# Patient Record
Sex: Female | Born: 1990 | Race: White | Hispanic: No | Marital: Married
Health system: Midwestern US, Community
[De-identification: ages and names within clinical notes are randomized; demographics above are authoritative.]

## PROBLEM LIST (undated history)

## (undated) ENCOUNTER — Inpatient Hospital Stay (HOSPITAL_COMMUNITY): Payer: Self-pay

## (undated) DIAGNOSIS — A530 Latent syphilis, unspecified as early or late: Secondary | ICD-10-CM

## (undated) DIAGNOSIS — Z789 Other specified health status: Secondary | ICD-10-CM

## (undated) DIAGNOSIS — K829 Disease of gallbladder, unspecified: Secondary | ICD-10-CM

## (undated) DIAGNOSIS — O24419 Gestational diabetes mellitus in pregnancy, unspecified control: Secondary | ICD-10-CM

## (undated) DIAGNOSIS — F99 Mental disorder, not otherwise specified: Secondary | ICD-10-CM

## (undated) DIAGNOSIS — F319 Bipolar disorder, unspecified: Secondary | ICD-10-CM

## (undated) DIAGNOSIS — Z0000 Encounter for general adult medical examination without abnormal findings: Secondary | ICD-10-CM

## (undated) HISTORY — PX: ADENOIDECTOMY: SUR15

## (undated) HISTORY — PX: OTHER SURGICAL HISTORY: SHX169

## (undated) HISTORY — DX: Latent syphilis, unspecified as early or late: A53.0

## (undated) HISTORY — PX: ERCP: SHX5425

## (undated) HISTORY — DX: Gestational diabetes mellitus in pregnancy, unspecified control: O24.419

---
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
## 2012-01-22 NOTE — L&D Delivery Note (Signed)
Delivery Note At 6:33 PM a viable female was delivered via Vaginal, Spontaneous Delivery (Presentation: Right Occiput Anterior).  APGAR: 9, 9; weight TBD.   Placenta status: Intact, Spontaneous.  Cord: 3 vessels with the following complications: None.   Mom presented in spontaneous labor with SROM. Her antenatal care was unremarkable. Labor was augmented with pitocin and her pain was controlled with an epidural.   Anesthesia: Epidural  Episiotomy: None Lacerations: None Suture Repair: none Est. Blood Loss (mL): 300  Mom to postpartum.  Baby to nursery-stable.  Emerson Schreifels L 09/28/2012, 7:12 PM

## 2012-02-08 DIAGNOSIS — M545 Low back pain, unspecified: Secondary | ICD-10-CM | POA: Insufficient documentation

## 2012-02-08 DIAGNOSIS — O9989 Other specified diseases and conditions complicating pregnancy, childbirth and the puerperium: Secondary | ICD-10-CM | POA: Insufficient documentation

## 2012-02-08 DIAGNOSIS — R109 Unspecified abdominal pain: Secondary | ICD-10-CM | POA: Insufficient documentation

## 2012-02-09 ENCOUNTER — Emergency Department (HOSPITAL_COMMUNITY)
Admission: EM | Admit: 2012-02-09 | Discharge: 2012-02-09 | Discharge status: Against Medical Advice | Payer: Self-pay | Attending: Emergency Medicine | Admitting: Emergency Medicine

## 2012-02-09 ENCOUNTER — Encounter (HOSPITAL_COMMUNITY): Payer: Self-pay | Admitting: Emergency Medicine

## 2012-02-09 DIAGNOSIS — Z3490 Encounter for supervision of normal pregnancy, unspecified, unspecified trimester: Secondary | ICD-10-CM

## 2012-02-09 LAB — URINALYSIS, ROUTINE W REFLEX MICROSCOPIC
Glucose, UA: NEGATIVE mg/dL
Specific Gravity, Urine: 1.019 (ref 1.005–1.030)
pH: 5.5 (ref 5.0–8.0)

## 2012-02-09 LAB — PREGNANCY, URINE: Preg Test, Ur: POSITIVE — AB

## 2012-02-09 LAB — URINE MICROSCOPIC-ADD ON

## 2012-02-09 MED ORDER — PRENATAL COMPLETE 14-0.4 MG PO TABS: 1.0000 | ORAL_TABLET | Freq: Once | ORAL | Status: DC

## 2012-02-09 NOTE — ED Provider Notes (Signed)
History     CSN: [id number]  Arrival date & time 02/08/12  2354   First MD Initiated Contact with Patient 02/09/12 0017      Chief Complaint  Patient presents with  . Abdominal Cramping   HPI  History provided by the patient and mother. Patient is a 22 year old female with no significant PMH who presents with complaints of occasional low back pain and cramping as well as concerns for possible new pregnancy. Patient is unsure but believes her last menstrual cycle was in November. Patient was having some back and abdominal cramps after lifting furniture at home a few days ago. Patient's mother was concerned for her symptoms and have patient use a home pregnancy test which was positive. Patient is unsure exactly how far along she may be. She denies having any constant pains. Denies any vaginal bleeding or vaginal discharge. She denies any problems with urination. No dysuria, urinary frequency or hematuria. Patient has not used any treatment for her occasional back soreness and cramps.    Past Medical History  Diagnosis Date  . No pertinent past medical history     Past Surgical History  Procedure Date  . Tubes in ears   . Cholecystectomy 2012    Family History  Problem Relation Age of Onset  . Diabetes Father   . Hypertension Father   . Diabetes Maternal Aunt   . Hypertension Maternal Aunt   . Hypertension Maternal Uncle   . Hypertension Maternal Grandmother   . Asthma Paternal Grandmother     History  Substance Use Topics  . Smoking status: Never Smoker   . Smokeless tobacco: Never Used  . Alcohol Use: No    OB History    Grav Para Term Preterm Abortions TAB SAB Ect Mult Living   2 1 1       1       Review of Systems  Gastrointestinal: Negative for nausea, vomiting and abdominal pain.  Genitourinary: Negative for vaginal bleeding and vaginal discharge.  Musculoskeletal: Positive for back pain.  All other systems reviewed and are negative.    Allergies    Review of patient's allergies indicates no known allergies.  Home Medications  No current outpatient prescriptions on file.  BP 135/67  Pulse 79  Temp 98.3 F (36.8 C) (Oral)  Resp 16  Ht 5\' 4"  (1.626 m)  Wt 216 lb 3.2 oz (98.068 kg)  BMI 37.11 kg/m2  SpO2 100%  LMP 12/05/2011  Physical Exam  Nursing note and vitals reviewed. Constitutional: She is oriented to person, place, and time. She appears well-developed and well-nourished. No distress.  HENT:  Head: Normocephalic.  Cardiovascular: Normal rate and regular rhythm.   Pulmonary/Chest: Effort normal and breath sounds normal. No respiratory distress. She has no wheezes. She has no rales.  Abdominal: Soft. There is no tenderness. There is no rebound and no guarding.       Obese  Genitourinary: Cervix exhibits no motion tenderness and no friability. Right adnexum displays no mass and no tenderness. Left adnexum displays no mass and no tenderness.       Chaperone was present. Cervix is closed. There is thick white discharge throughout. No bleeding or blood in the vaginal canal.  Musculoskeletal:        Mild lumbar paraspinous tenderness.  Neurological: She is alert and oriented to person, place, and time.  Skin: Skin is warm and dry. No rash noted.  Psychiatric: She has a normal mood and affect. Her behavior is normal.  ED Course  Procedures  Results for orders placed during the hospital encounter of 02/09/12  URINALYSIS, ROUTINE W REFLEX MICROSCOPIC      Component Value Range   Color, Urine YELLOW  YELLOW   APPearance HAZY (*) CLEAR   Specific Gravity, Urine 1.019  1.005 - 1.030   pH 5.5  5.0 - 8.0   Glucose, UA NEGATIVE  NEGATIVE mg/dL   Hgb urine dipstick NEGATIVE  NEGATIVE   Bilirubin Urine NEGATIVE  NEGATIVE   Ketones, ur NEGATIVE  NEGATIVE mg/dL   Protein, ur NEGATIVE  NEGATIVE mg/dL   Urobilinogen, UA 0.2  0.0 - 1.0 mg/dL   Nitrite NEGATIVE  NEGATIVE   Leukocytes, UA SMALL (*) NEGATIVE  PREGNANCY, URINE       Component Value Range   Preg Test, Ur POSITIVE (*) NEGATIVE  URINE MICROSCOPIC-ADD ON      Component Value Range   Squamous Epithelial / LPF MANY (*) RARE   WBC, UA 0-2  <3 WBC/hpf   Bacteria, UA FEW (*) RARE  HCG, QUANTITATIVE, PREGNANCY      Component Value Range   hCG, Beta Chain, Quant, S 30631 (*) <5 mIU/mL       1. Pregnancy       MDM  Patient seen and evaluated. Patient appears well in no acute distress. She is soft nontender abdomen on exam. There is a mild paralumbar tenderness.   I discussed with patient options for further workup of her pregnancy. At this time I doubt any emergent condition and we will proceed with ultrasound testing and labwork.   Patient has continued to wait for ultrasound test but no longer wishes to wait. She is not in any pain or discomfort while she's been in the emergency room. Patient advised of the risks, benefits of staying for her ultrasound versus leaving at this point. She understands that by leaving there may be signs of miscarriage or other concerning findings such as possible ectopic pregnancy. She understands this and understands she may return at anytime for further evaluation. Patient strongly encouraged to return if she has persistent or worsening pain symptoms, vaginal bleeding or increased discharge.     Angus Seller, [place] 02/09/12 [phone]

## 2012-02-09 NOTE — ED Notes (Signed)
Pt mother sts she has been lifting heavy objects and having cramping. Pt is unaware how along she is in pregnancy.

## 2012-02-09 NOTE — ED Provider Notes (Signed)
Medical screening examination/treatment/procedure(s) were performed by non-physician practitioner and as supervising physician I was immediately available for consultation/collaboration.  Avarie Tavano M Tylique Aull, MD 02/09/12 0610 

## 2012-02-09 NOTE — ED Notes (Signed)
Pelvic setup in room 

## 2012-02-29 ENCOUNTER — Inpatient Hospital Stay (HOSPITAL_COMMUNITY)
Admission: AD | Admit: 2012-02-29 | Discharge: 2012-03-01 | Disposition: A | Discharge status: Home / Self Care | Payer: Self-pay | Source: Ambulatory Visit | Attending: Obstetrics & Gynecology | Admitting: Obstetrics & Gynecology

## 2012-02-29 ENCOUNTER — Inpatient Hospital Stay (HOSPITAL_COMMUNITY): Payer: Self-pay

## 2012-02-29 ENCOUNTER — Encounter (HOSPITAL_COMMUNITY): Payer: Self-pay | Admitting: *Deleted

## 2012-02-29 DIAGNOSIS — R109 Unspecified abdominal pain: Secondary | ICD-10-CM | POA: Insufficient documentation

## 2012-02-29 DIAGNOSIS — B9689 Other specified bacterial agents as the cause of diseases classified elsewhere: Secondary | ICD-10-CM | POA: Insufficient documentation

## 2012-02-29 DIAGNOSIS — A499 Bacterial infection, unspecified: Secondary | ICD-10-CM | POA: Insufficient documentation

## 2012-02-29 DIAGNOSIS — O26891 Other specified pregnancy related conditions, first trimester: Secondary | ICD-10-CM

## 2012-02-29 DIAGNOSIS — N760 Acute vaginitis: Secondary | ICD-10-CM | POA: Insufficient documentation

## 2012-02-29 DIAGNOSIS — N949 Unspecified condition associated with female genital organs and menstrual cycle: Secondary | ICD-10-CM | POA: Insufficient documentation

## 2012-02-29 HISTORY — DX: Bipolar disorder, unspecified: F31.9

## 2012-02-29 HISTORY — DX: Mental disorder, not otherwise specified: F99

## 2012-02-29 LAB — URINALYSIS, ROUTINE W REFLEX MICROSCOPIC
Bilirubin Urine: NEGATIVE
Hgb urine dipstick: NEGATIVE
Protein, ur: NEGATIVE mg/dL
Urobilinogen, UA: 0.2 mg/dL (ref 0.0–1.0)

## 2012-02-29 LAB — WET PREP, GENITAL: Trich, Wet Prep: NONE SEEN

## 2012-02-29 NOTE — MAU Note (Signed)
Having cramping in my lower stomach since 2000

## 2012-02-29 NOTE — MAU Provider Note (Signed)
History     CSN: [id number]  Arrival date and time: 02/29/12 2134   First Provider Initiated Contact with Patient 02/29/12 2224      Chief Complaint  Patient presents with  . Abdominal Pain   HPI  Pt is a G2P1001 here at unknown gestational age with report of abdominal pain that started at around 2000 tonight.  Pain is described as sharp and in lower pelvis.  No report of vaginal bleeding or abnormal vaginal discharge.  Pt reports uncertain about LMP, cycles are irregular.  Reports multiple sexual partners.  Past Medical History  Diagnosis Date  . No pertinent past medical history   . Mental disorder   . Bipolar 1 disorder     Past Surgical History  Procedure Laterality Date  . Tubes in ears    . Cholecystectomy  2012    Family History  Problem Relation Age of Onset  . Diabetes Father   . Hypertension Father   . Diabetes Maternal Aunt   . Hypertension Maternal Aunt   . Hypertension Maternal Uncle   . Hypertension Maternal Grandmother   . Asthma Paternal Grandmother     History  Substance Use Topics  . Smoking status: Never Smoker   . Smokeless tobacco: Never Used  . Alcohol Use: No    Allergies: No Known Allergies  Prescriptions prior to admission  Medication Sig Dispense Refill  . Prenatal Vit-Fe Fumarate-FA (PRENATAL COMPLETE) 14-0.4 MG TABS Take 1 tablet by mouth once.  60 each  0    Review of Systems  Gastrointestinal: Positive for abdominal pain (lower pelvis). Negative for nausea and vomiting.  All other systems reviewed and are negative.   Physical Exam   Blood pressure 123/58, pulse 89, temperature 98.2 F (36.8 C), temperature source Oral, resp. rate 20, height 5\' 4"  (1.626 m), last menstrual period 12/05/2011, unknown if currently breastfeeding.  Physical Exam  Constitutional: She is oriented to person, place, and time. She appears well-developed and well-nourished. No distress.  HENT:  Head: Normocephalic.  Neck: Normal range of motion.  Neck supple.  Cardiovascular: Normal rate, regular rhythm and normal heart sounds.   Respiratory: Effort normal and breath sounds normal. No respiratory distress.  GI: Soft. She exhibits no mass. There is no tenderness. There is no rebound and no guarding.  Genitourinary: There is no lesion on the right labia. There is no lesion on the left labia. Uterus is enlarged. Right adnexum displays no mass, no tenderness and no fullness. Left adnexum displays no mass, no tenderness and no fullness. No bleeding around the vagina.  Musculoskeletal: Normal range of motion.  Neurological: She is alert and oriented to person, place, and time.  Skin: Skin is warm and dry.    MAU Course  Procedures  Results for orders placed during the hospital encounter of 02/29/12 (from the past 24 hour(s))  URINALYSIS, ROUTINE W REFLEX MICROSCOPIC     Status: Abnormal   Collection Time    02/29/12 10:00 PM      Result Value Range   Color, Urine YELLOW  YELLOW   APPearance CLEAR  CLEAR   Specific Gravity, Urine >1.030 (*) 1.005 - 1.030   pH 5.5  5.0 - 8.0   Glucose, UA NEGATIVE  NEGATIVE mg/dL   Hgb urine dipstick NEGATIVE  NEGATIVE   Bilirubin Urine NEGATIVE  NEGATIVE   Ketones, ur 15 (*) NEGATIVE mg/dL   Protein, ur NEGATIVE  NEGATIVE mg/dL   Urobilinogen, UA 0.2  0.0 - 1.0 mg/dL  Nitrite NEGATIVE  NEGATIVE   Leukocytes, UA NEGATIVE  NEGATIVE  WET PREP, GENITAL     Status: Abnormal   Collection Time    02/29/12 10:32 PM      Result Value Range   Yeast Wet Prep HPF POC NONE SEEN  NONE SEEN   Trich, Wet Prep NONE SEEN  NONE SEEN   Clue Cells Wet Prep HPF POC FEW (*) NONE SEEN   WBC, Wet Prep HPF POC FEW (*) NONE SEEN   IMPRESSION:  Single live intrauterine gestation with estimated gestational age  [redacted] weeks 0 days by crown-rump length.    Assessment and Plan  Pelvic Pain in Early Pregnancy Bacterial Vaginosis  Plan: DC to home RX Flagyl Begin prenatal care as soon as  possible  [facility] 02/29/2012, 10:25 PM

## 2012-03-01 ENCOUNTER — Encounter (HOSPITAL_COMMUNITY): Payer: Self-pay | Admitting: Family

## 2012-03-01 DIAGNOSIS — R109 Unspecified abdominal pain: Secondary | ICD-10-CM

## 2012-03-01 DIAGNOSIS — B9689 Other specified bacterial agents as the cause of diseases classified elsewhere: Secondary | ICD-10-CM

## 2012-03-01 DIAGNOSIS — N949 Unspecified condition associated with female genital organs and menstrual cycle: Secondary | ICD-10-CM

## 2012-03-01 DIAGNOSIS — A499 Bacterial infection, unspecified: Secondary | ICD-10-CM

## 2012-03-01 DIAGNOSIS — N760 Acute vaginitis: Secondary | ICD-10-CM

## 2012-03-01 MED ORDER — METRONIDAZOLE 500 MG PO TABS: 500.0000 mg | ORAL_TABLET | Freq: Two times a day (BID) | ORAL | Status: DC

## 2012-03-01 NOTE — Progress Notes (Signed)
Written and verbal d/c instructions given and understanding voiced. 

## 2012-03-01 NOTE — MAU Provider Note (Signed)
Attestation of Attending Supervision of Advanced Practitioner (CNM/NP): Evaluation and management procedures were performed by the Advanced Practitioner under my supervision and collaboration.  I have reviewed the Advanced Practitioner's note and chart, and I agree with the management and plan.  HARRAWAY-Waage, Alphons Burgert 9:15 AM

## 2012-03-03 LAB — GC/CHLAMYDIA PROBE AMP
CT Probe RNA: NEGATIVE
GC Probe RNA: NEGATIVE

## 2012-05-06 IMAGING — US US OB COMP LESS 14 WK
1 series · 14 of 28 positions shown · non-contrast
Comparison: none

OBSTETRICAL ULTRASOUND:
 This ultrasound exam was performed in the [HOSPITAL] Ultrasound Department.  The OB US report was generated in the AS system, and faxed to the ordering physician.  This report is also available in [HOSPITAL]?s [employer] and in [REDACTED] PACS.

[Series 1: us ob comp less 14 wks · 0.25mm/px · 14 of 30 slices shown]
[im 2/30]
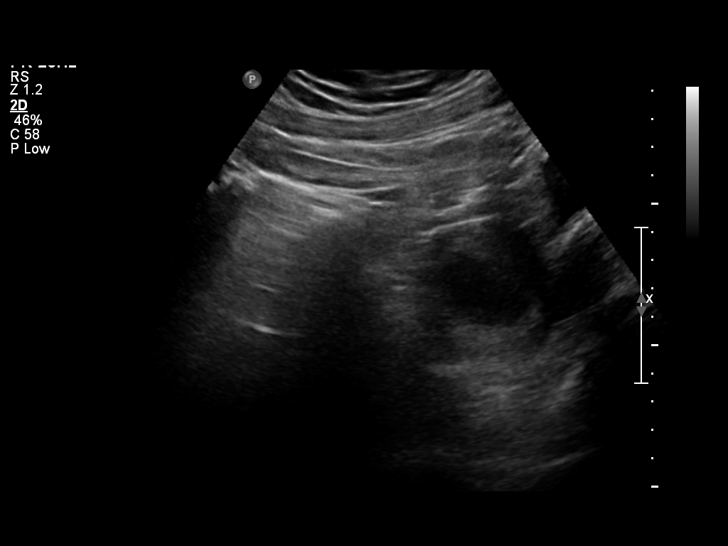
[im 4/30]
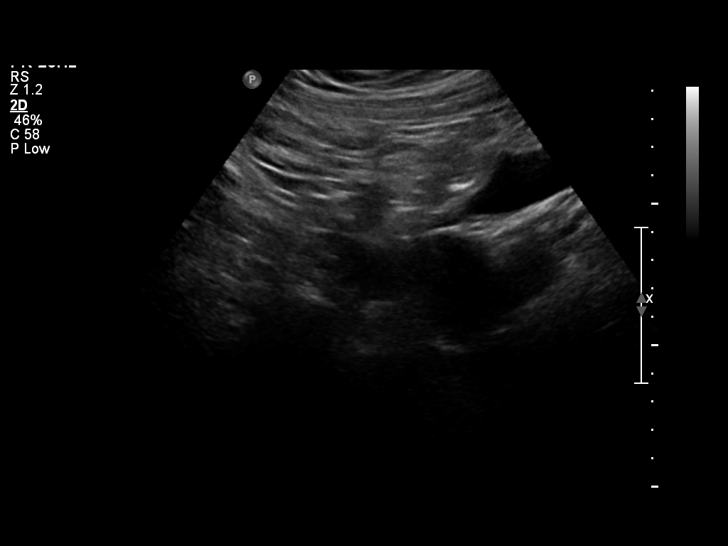
[im 6/30]
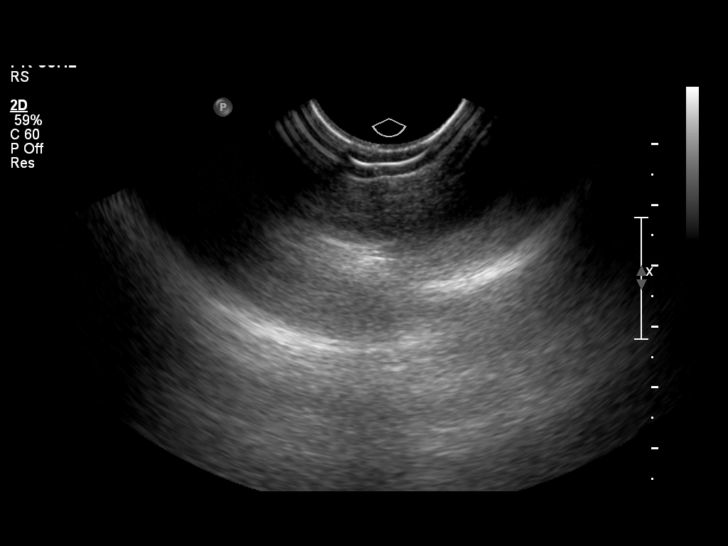
[im 8/30]
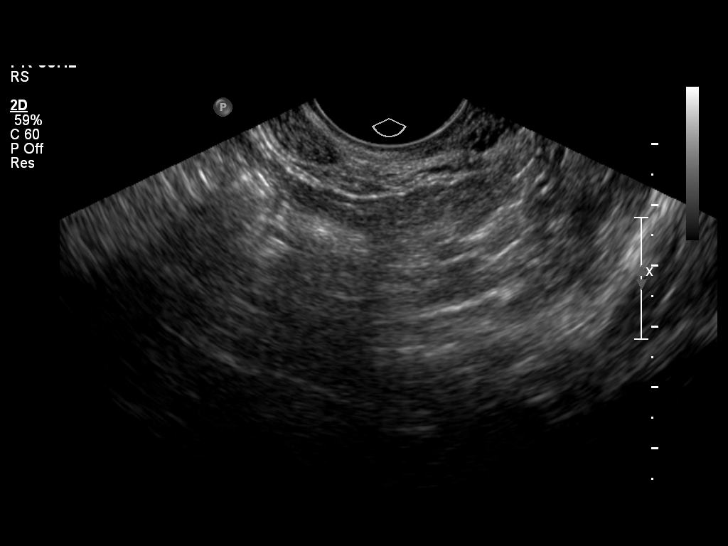
[im 10/30]
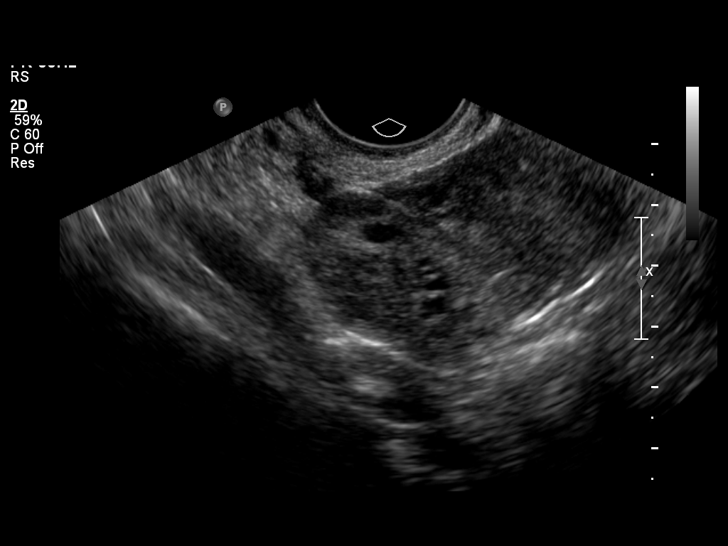
[im 12/30]
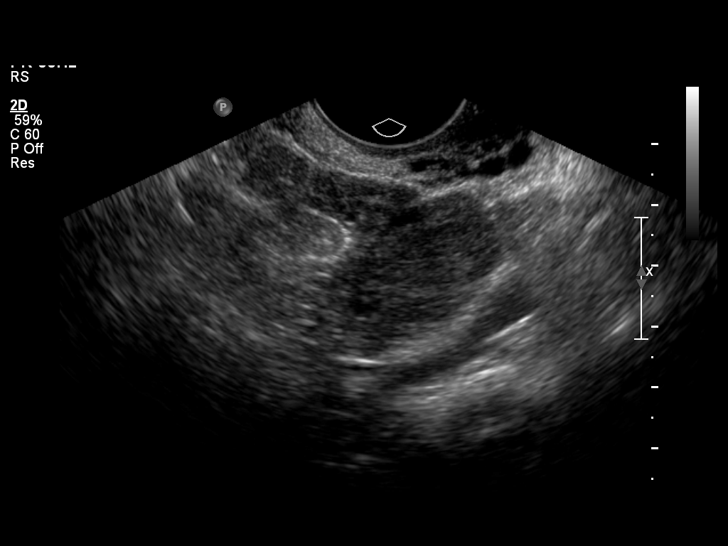
[im 14/30]
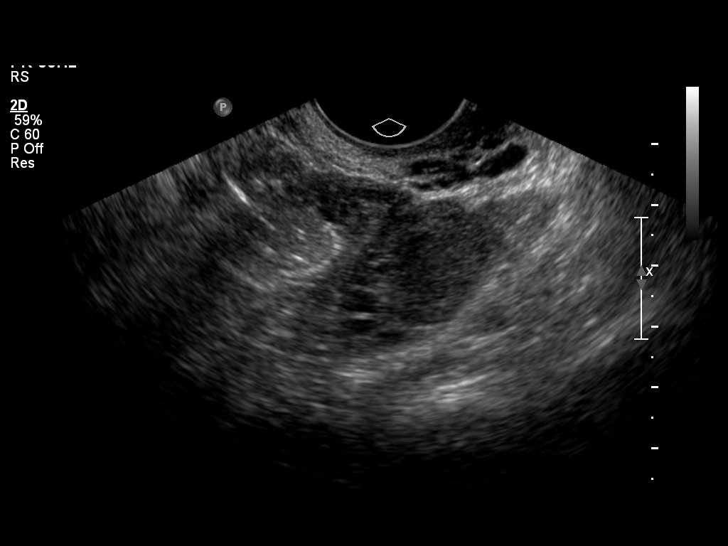
[im 17/30]
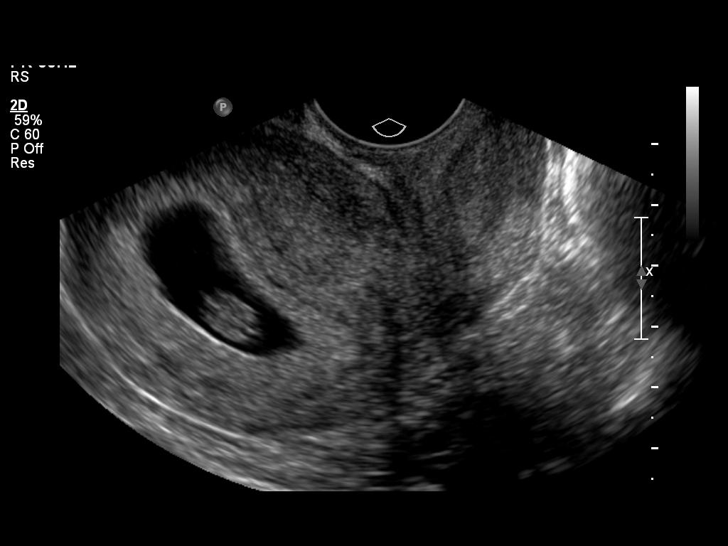
[im 19/30]
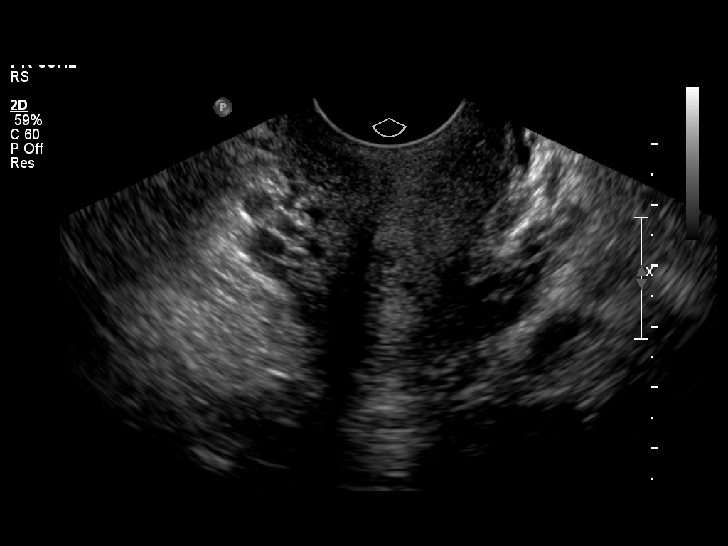
[im 21/30]
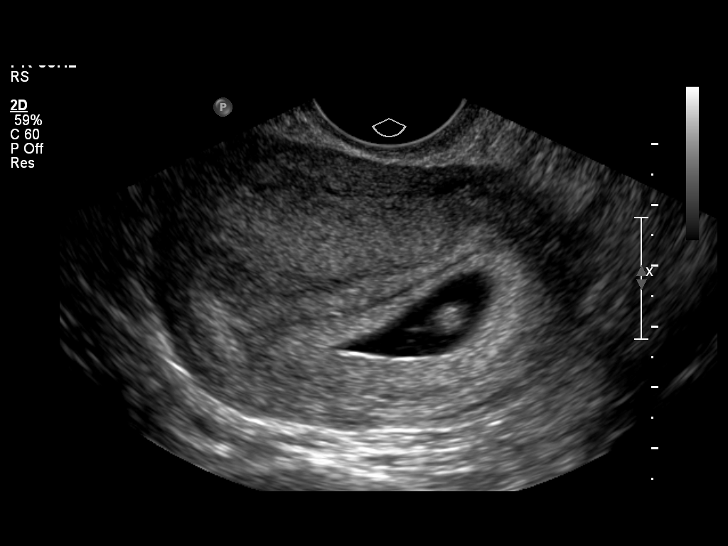
[im 23/30]
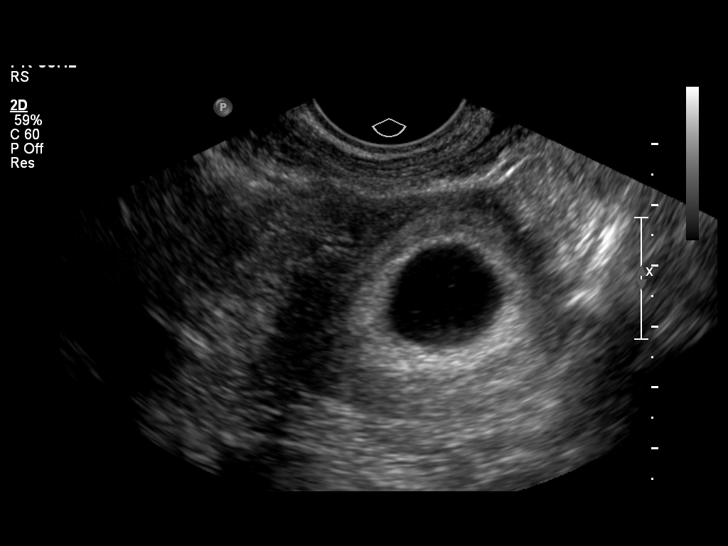
[im 25/30]
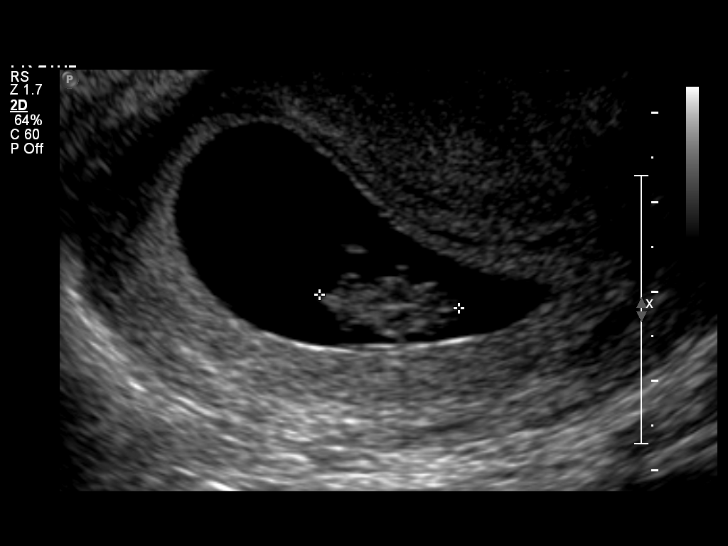
[im 27/30]
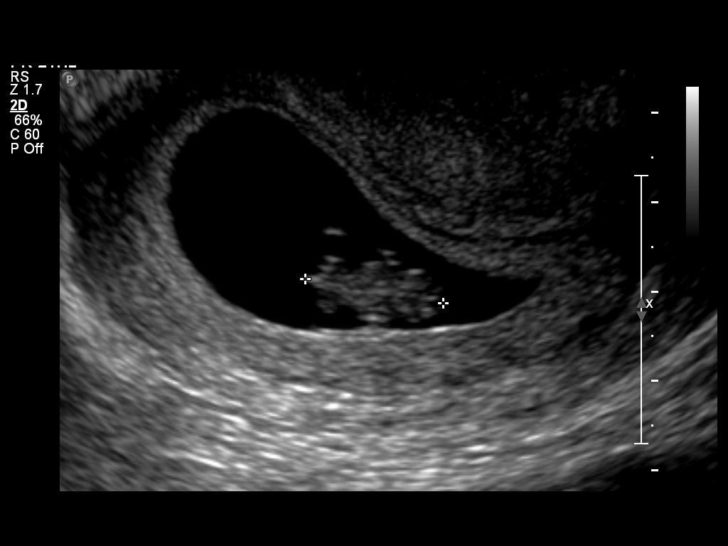
[im 30/30]
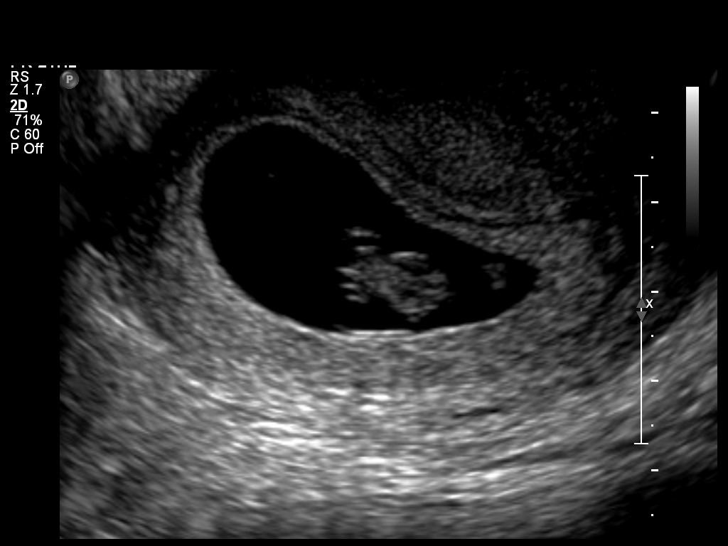

[14 of 28 positions shown; findings below may reference images not displayed]

IMPRESSION: See AS Obstetric US report.

## 2012-07-01 ENCOUNTER — Observation Stay: Payer: Self-pay | Admitting: Obstetrics and Gynecology

## 2012-07-01 LAB — URINALYSIS, COMPLETE
Blood: NEGATIVE
Glucose,UR: NEGATIVE mg/dL (ref 0–75)
Ketone: NEGATIVE
Ph: 6 (ref 4.5–8.0)
Squamous Epithelial: 3

## 2012-07-01 LAB — DRUG SCREEN, URINE
Amphetamines, Ur Screen: NEGATIVE (ref ?–1000)
Barbiturates, Ur Screen: NEGATIVE (ref ?–200)
Benzodiazepine, Ur Scrn: NEGATIVE (ref ?–200)
Cocaine Metabolite,Ur ~~LOC~~: NEGATIVE (ref ?–300)

## 2012-07-01 LAB — PLATELET COUNT: Platelet: 296 10*3/uL (ref 150–440)

## 2012-07-01 LAB — GLUCOSE, RANDOM: Glucose: 79 mg/dL (ref 65–99)

## 2012-07-21 ENCOUNTER — Observation Stay: Payer: Self-pay | Admitting: Obstetrics and Gynecology

## 2012-07-29 ENCOUNTER — Other Ambulatory Visit (HOSPITAL_COMMUNITY)
Admission: RE | Admit: 2012-07-29 | Discharge: 2012-07-29 | Disposition: A | Discharge status: Home / Self Care | Payer: Medicaid Other | Source: Ambulatory Visit | Attending: Advanced Practice Midwife | Admitting: Advanced Practice Midwife

## 2012-07-29 ENCOUNTER — Encounter: Payer: Self-pay | Admitting: Advanced Practice Midwife

## 2012-07-29 ENCOUNTER — Ambulatory Visit (INDEPENDENT_AMBULATORY_CARE_PROVIDER_SITE_OTHER): Payer: Medicaid Other | Admitting: Advanced Practice Midwife

## 2012-07-29 VITALS — BP 125/75 | Temp 98.7°F | Wt 211.9 lb

## 2012-07-29 DIAGNOSIS — O0933 Supervision of pregnancy with insufficient antenatal care, third trimester: Secondary | ICD-10-CM

## 2012-07-29 DIAGNOSIS — O0930 Supervision of pregnancy with insufficient antenatal care, unspecified trimester: Secondary | ICD-10-CM

## 2012-07-29 DIAGNOSIS — Z113 Encounter for screening for infections with a predominantly sexual mode of transmission: Secondary | ICD-10-CM | POA: Insufficient documentation

## 2012-07-29 DIAGNOSIS — Z01419 Encounter for gynecological examination (general) (routine) without abnormal findings: Secondary | ICD-10-CM | POA: Insufficient documentation

## 2012-07-29 LAB — POCT URINALYSIS DIP (DEVICE)
Bilirubin Urine: NEGATIVE
Ketones, ur: NEGATIVE mg/dL
Nitrite: NEGATIVE
Protein, ur: NEGATIVE mg/dL

## 2012-07-29 NOTE — Progress Notes (Signed)
Pulse- 83  Edema-feet New ob packet given Weight gain of 11-20lb Tdap vaccine consented

## 2012-07-29 NOTE — Addendum Note (Signed)
Addended by: Franchot Mimes on: 07/29/2012 11:38 AM   Modules accepted: Orders

## 2012-07-29 NOTE — Progress Notes (Signed)
   Subjective:    Gina Hurley is a G2P1001 [redacted]w[redacted]d being seen today for her first obstetrical visit.  Her obstetrical history is significant for obesity. Patient does not intend to breast feed. Pregnancy history fully reviewed. She states that with her last pregnancy she had to have her gallbladder removed. She denies any PTL. She had a vaginal delivery of a female infant weighing 8-4.   Patient reports no complaints.  Filed Vitals:   07/29/12 0934  BP: 125/75  Temp: 98.7 F (37.1 C)  Weight: 211 lb 14.4 oz (96.117 kg)    HISTORY: OB History   Grav Para Term Preterm Abortions TAB SAB Ect Mult Living   2 1 1       1      # Outc Date GA Lbr Len/2nd Wgt Sex Del Anes PTL Lv   1 TRM 5/12 [redacted]w[redacted]d 14:00 8lb4oz(3.742kg) F SVD EPI No Yes   2 CUR              Past Medical History  Diagnosis Date  . No pertinent past medical history   . Mental disorder   . Bipolar 1 disorder    Past Surgical History  Procedure Laterality Date  . Tubes in ears    . Cholecystectomy  2012   Family History  Problem Relation Age of Onset  . Diabetes Father   . Hypertension Father   . Diabetes Maternal Aunt   . Hypertension Maternal Aunt   . Hypertension Maternal Uncle   . Hypertension Maternal Grandmother   . Asthma Paternal Grandmother      Exam    Uterus:     Pelvic Exam:    Perineum: No Hemorrhoids   Vulva: normal   Vagina:  normal mucosa   pH:    Cervix: no lesions   Adnexa: diffiuclt to assess 2/2 body habitus and GA   Bony Pelvis: proven to 8-4  System: Breast:  normal appearance, no masses or tenderness   Skin: normal coloration and turgor, no rashes    Neurologic: oriented, normal   Extremities: normal strength, tone, and muscle mass   HEENT    Mouth/Teeth mucous membranes moist, pharynx normal without lesions and dental hygiene good   Neck supple   Cardiovascular: regular rate and rhythm   Respiratory:  appears well, vitals normal, no respiratory distress, acyanotic, normal  RR, ear and throat exam is normal, neck free of mass or lymphadenopathy, chest clear, no wheezing, crepitations, rhonchi, normal symmetric air entry   Abdomen: soft, non-tender; bowel sounds normal; no masses,  no organomegaly   Urinary: urethral meatus normal      Assessment:    Pregnancy: G2P1001 Patient Active Problem List   Diagnosis Date Noted  . Back pain 09/06/2010        Plan:     Initial labs drawn. Prenatal vitamins. Problem list reviewed and updated. Genetic Screening discussed First Screen, Integrated Screen, Quad Screen, [facility] and Amniocentesis: too late to care. Marland Kitchen  Ultrasound discussed; fetal survey: requested.  Follow up in 2 weeks. 75% of 45 min visit spent on counseling and coordination of care.   Gina Hurley 07/29/2012

## 2012-07-30 ENCOUNTER — Encounter: Payer: Self-pay | Admitting: *Deleted

## 2012-07-30 LAB — OBSTETRIC PANEL
Basophils Relative: 0 % (ref 0–1)
Hemoglobin: 10.7 g/dL — ABNORMAL LOW (ref 12.0–15.0)
Hepatitis B Surface Ag: NEGATIVE
MCHC: 35 g/dL (ref 30.0–36.0)
Monocytes Relative: 5 % (ref 3–12)
Neutro Abs: 8.1 10*3/uL — ABNORMAL HIGH (ref 1.7–7.7)
Neutrophils Relative %: 75 % (ref 43–77)
Platelets: 317 10*3/uL (ref 150–400)
RBC: 3.58 MIL/uL — ABNORMAL LOW (ref 3.87–5.11)
Rh Type: POSITIVE

## 2012-07-30 LAB — GLUCOSE TOLERANCE, 1 HOUR (50G) W/O FASTING: Glucose, 1 Hour GTT: 136 mg/dL (ref 70–140)

## 2012-07-30 LAB — HIV ANTIBODY (ROUTINE TESTING W REFLEX): HIV: NONREACTIVE

## 2012-07-31 LAB — CULTURE, OB URINE

## 2012-08-04 ENCOUNTER — Ambulatory Visit (HOSPITAL_COMMUNITY): Admission: RE | Admit: 2012-08-04 | Payer: Self-pay | Source: Ambulatory Visit

## 2012-08-05 ENCOUNTER — Telehealth: Payer: Self-pay

## 2012-08-05 NOTE — Telephone Encounter (Signed)
Message copied by Faythe Casa on Wed Aug 05, 2012  4:52 PM ------      Message from: Thressa Sheller D      Created: Thu Jul 30, 2012  8:53 PM      Regarding: needs 3hour GTT       Pt needs to have 3 hour GTT done  ------

## 2012-08-05 NOTE — Telephone Encounter (Signed)
Called pt and was unable to leave message due to message stating "the wireless customer you are trying to reach is unavailable please try your call again later."

## 2012-08-10 NOTE — Telephone Encounter (Signed)
Called pt and unable to leave message to "wireless customer is unavailable please try call again later."  Pt has appt scheduled 08/12/12, will notify pt then.

## 2012-08-12 ENCOUNTER — Ambulatory Visit (INDEPENDENT_AMBULATORY_CARE_PROVIDER_SITE_OTHER): Payer: Medicaid Other | Admitting: Family Medicine

## 2012-08-12 ENCOUNTER — Encounter: Payer: Self-pay | Admitting: Obstetrics and Gynecology

## 2012-08-12 VITALS — BP 113/68 | Temp 97.0°F | Wt 213.7 lb

## 2012-08-12 DIAGNOSIS — Z3493 Encounter for supervision of normal pregnancy, unspecified, third trimester: Secondary | ICD-10-CM

## 2012-08-12 DIAGNOSIS — Z23 Encounter for immunization: Secondary | ICD-10-CM

## 2012-08-12 DIAGNOSIS — R7309 Other abnormal glucose: Secondary | ICD-10-CM

## 2012-08-12 DIAGNOSIS — O0930 Supervision of pregnancy with insufficient antenatal care, unspecified trimester: Secondary | ICD-10-CM

## 2012-08-12 LAB — POCT URINALYSIS DIP (DEVICE)
Bilirubin Urine: NEGATIVE
Glucose, UA: NEGATIVE mg/dL
Nitrite: NEGATIVE
Urobilinogen, UA: 0.2 mg/dL (ref 0.0–1.0)

## 2012-08-12 MED ORDER — TETANUS-DIPHTH-ACELL PERTUSSIS 5-2.5-18.5 LF-MCG/0.5 IM SUSP: 0.5000 mL | Freq: Once | INTRAMUSCULAR | Status: DC

## 2012-08-12 NOTE — Progress Notes (Signed)
Pulse- 96 

## 2012-08-12 NOTE — Progress Notes (Signed)
Gina Hurley is a 21 y.o. G2P1001 at [redacted]w[redacted]d by L=10 here for ROB visit. +FM, -LOF, -Vb, -Ctx  Doing 3hr glucola, WNL Reschedule [place]  Discussed with Patient:  -Plans to bottle feed.  All questions answered. -Continue prenatal vitamins. -Reviewed fetal kick counts Pt to perform daily at a time when the baby is active, lie laterally with both hands on belly in quiet room and count all movements (hiccups, shoulder rolls, obvious kicks, etc); pt is to report to clinic L&D for less than 10 movements felt in a one hour time period-pt told as soon as she counts 10 movements the count is complete.  - Routine precautions discussed (depression, infection s/s).   Patient provided with all pertinent phone numbers for emergencies. - RTC for any VB, regular, painful cramps/ctxs occurring at a rate of >2/10 min, fever (100.5 or higher), n/v/d, any pain that is unresolving or worsening, LOF, decreased fetal movement, CP, SOB, edema - RTC in 2 weeks for next appt.  Problems: Patient Active Problem List   Diagnosis Date Noted  . Back pain 09/06/2010    To Do: 1. Tdap will be given today  [ x] Vaccines: Tdap: will give it today [ x] BCM: Nexplanon [ x] Readiness: baby has a place to sleep, car seat, other baby necessities.  Edu: [x ] PTL precautions;  [ x]  BF counseling;  Contact # [phone]

## 2012-08-13 DIAGNOSIS — R7309 Other abnormal glucose: Secondary | ICD-10-CM | POA: Insufficient documentation

## 2012-08-13 LAB — GLUCOSE TOLERANCE, 3 HOURS
Glucose Tolerance, 1 hour: 164 mg/dL (ref 70–189)
Glucose Tolerance, 2 hour: 95 mg/dL (ref 70–164)
Glucose Tolerance, Fasting: 92 mg/dL (ref 70–104)

## 2012-08-17 ENCOUNTER — Ambulatory Visit (HOSPITAL_COMMUNITY)
Admission: RE | Admit: 2012-08-17 | Discharge: 2012-08-17 | Disposition: A | Discharge status: Home / Self Care | Payer: Medicaid Other | Source: Ambulatory Visit | Attending: Advanced Practice Midwife | Admitting: Advanced Practice Midwife

## 2012-08-17 DIAGNOSIS — O0933 Supervision of pregnancy with insufficient antenatal care, third trimester: Secondary | ICD-10-CM

## 2012-08-17 DIAGNOSIS — O0930 Supervision of pregnancy with insufficient antenatal care, unspecified trimester: Secondary | ICD-10-CM | POA: Insufficient documentation

## 2012-08-17 DIAGNOSIS — Z3689 Encounter for other specified antenatal screening: Secondary | ICD-10-CM | POA: Insufficient documentation

## 2012-08-25 ENCOUNTER — Observation Stay: Payer: Self-pay

## 2012-08-25 LAB — URINALYSIS, COMPLETE
Bacteria: NONE SEEN
Protein: 30
Squamous Epithelial: 3

## 2012-08-26 ENCOUNTER — Ambulatory Visit (INDEPENDENT_AMBULATORY_CARE_PROVIDER_SITE_OTHER): Payer: Medicaid Other | Admitting: Family Medicine

## 2012-08-26 VITALS — BP 126/80 | Temp 96.9°F | Wt 218.1 lb

## 2012-08-26 DIAGNOSIS — Z3490 Encounter for supervision of normal pregnancy, unspecified, unspecified trimester: Secondary | ICD-10-CM

## 2012-08-26 DIAGNOSIS — M549 Dorsalgia, unspecified: Secondary | ICD-10-CM

## 2012-08-26 DIAGNOSIS — Z3483 Encounter for supervision of other normal pregnancy, third trimester: Secondary | ICD-10-CM

## 2012-08-26 DIAGNOSIS — K219 Gastro-esophageal reflux disease without esophagitis: Secondary | ICD-10-CM | POA: Insufficient documentation

## 2012-08-26 DIAGNOSIS — O99810 Abnormal glucose complicating pregnancy: Secondary | ICD-10-CM

## 2012-08-26 LAB — POCT URINALYSIS DIP (DEVICE)
Nitrite: NEGATIVE
Protein, ur: NEGATIVE mg/dL
pH: 6.5 (ref 5.0–8.0)

## 2012-08-26 MED ORDER — PRENATAL COMPLETE 14-0.4 MG PO TABS: 1.0000 | ORAL_TABLET | Freq: Once | ORAL | Status: DC

## 2012-08-26 MED ORDER — PRENATAL VIT W/ FE BISG-FA 25-1 MG PO TABS: 1.0000 | ORAL_TABLET | Freq: Every day | ORAL | Status: DC

## 2012-08-26 MED ORDER — RANITIDINE HCL 150 MG PO TABS: 150.0000 mg | ORAL_TABLET | Freq: Every day | ORAL | Status: DC

## 2012-08-26 NOTE — Addendum Note (Signed)
Addended by: Franchot Mimes on: 08/26/2012 10:12 AM   Modules accepted: Orders

## 2012-08-26 NOTE — Progress Notes (Signed)
Pulse- 95 Patient reports pain & pressure and some contractions last night, went to [facility] for this- was given fluids

## 2012-08-26 NOTE — Addendum Note (Signed)
Addended by: Minta Balsam on: 08/26/2012 03:28 PM   Modules accepted: Orders, Medications

## 2012-08-26 NOTE — Progress Notes (Signed)
Gina Hurley is a 22 y.o. G2P1001 at [redacted]w[redacted]d by 10 week [place] here for ROB visit.  Discussed with Patient: Having indigestion, zantac working for this, prescribed today. Low back pain: discussed good posture, walking, sleeping with pillow between legs. -All questions answered. -Continue prenatal vitamins. -Reviewed fetal kick counts Pt to perform daily at a time when the baby is active, lie laterally with both hands on belly in quiet room and count all movements (hiccups, shoulder rolls, obvious kicks, etc); pt is to report to clinic L&D for less than 10 movements felt in a one hour time period-pt told as soon as she counts 10 movements the count is complete.  - Routine precautions discussed (depression, infection s/s).   Patient provided with all pertinent phone numbers for emergencies. - RTC for any VB, regular, painful cramps/ctxs occurring at a rate of >2/10 min, fever (100.5 or higher), n/v/d, any pain that is unresolving or worsening, LOF, decreased fetal movement, CP, SOB, edema - RTC in 2 weeks for next appt. - Did GBS swabs today and will f/u results and call if abnormal.  Contact#:  Pt has no drug allergies, so will get PCN if GBS+ OR will get sensitivities on GBS swab as pt is PCN-allergic.  Problems: Patient Active Problem List   Diagnosis Date Noted  . Elevated glucose tolerance test 08/13/2012  . Back pain 09/06/2010    To Do: 1. GC, GBS  [ ]  Vaccines: Flu:  Tdap: 08/12/12 [ ]  BCM:  [ ]  Readiness: baby has a place to sleep, car seat, other baby necessities.  Edu: [ ]  PTL precautions; [ ]  BF class; [ ]  childbirth class; [ ]   BF counseling;

## 2012-08-26 NOTE — Progress Notes (Signed)
I spoke with patient and agree with resident's note and plan of care. Reviewed note and results. Agree with plan  Tawana Scale, MD OB Fellow 08/26/2012 9:51 AM

## 2012-08-26 NOTE — Addendum Note (Signed)
Addended by: Jill Side on: 08/26/2012 02:27 PM   Modules accepted: Orders

## 2012-08-27 ENCOUNTER — Telehealth: Payer: Self-pay | Admitting: *Deleted

## 2012-08-27 NOTE — Telephone Encounter (Signed)
Someone from [employer] called and left a message stating she is calling about her PNV prescription- states she is having a hard time finding one with the amount of iron and folic acid that was requested. States they do not have any prescription or otc at [facility] , but can find a [facility] otc.  Wants to be called or a different prescription sent

## 2012-08-28 NOTE — Telephone Encounter (Signed)
Called [facility] and fixed the Rx to different PNV

## 2012-09-01 DIAGNOSIS — Z3490 Encounter for supervision of normal pregnancy, unspecified, unspecified trimester: Secondary | ICD-10-CM | POA: Insufficient documentation

## 2012-09-02 ENCOUNTER — Ambulatory Visit (INDEPENDENT_AMBULATORY_CARE_PROVIDER_SITE_OTHER): Payer: Medicaid Other | Admitting: Advanced Practice Midwife

## 2012-09-02 VITALS — BP 120/77 | Temp 98.2°F | Wt 223.6 lb

## 2012-09-02 DIAGNOSIS — R7309 Other abnormal glucose: Secondary | ICD-10-CM

## 2012-09-02 DIAGNOSIS — O99810 Abnormal glucose complicating pregnancy: Secondary | ICD-10-CM

## 2012-09-02 LAB — POCT URINALYSIS DIP (DEVICE)
Protein, ur: NEGATIVE mg/dL
Specific Gravity, Urine: >=1.03 (ref 1.005–1.030)
Urobilinogen, UA: 0.2 mg/dL (ref 0.0–1.0)

## 2012-09-02 MED ORDER — ONDANSETRON 4 MG PO TBDP: 4.0000 mg | ORAL_TABLET | Freq: Three times a day (TID) | ORAL | Status: DC | PRN

## 2012-09-02 NOTE — Progress Notes (Signed)
No complaints feeling well. Labor precautions given, fetal kick counts.

## 2012-09-02 NOTE — Progress Notes (Signed)
Pulse- 101 

## 2012-09-04 ENCOUNTER — Observation Stay: Payer: Self-pay

## 2012-09-04 LAB — URINALYSIS, COMPLETE
Blood: NEGATIVE
Protein: 30
WBC UR: 11 /HPF (ref 0–5)

## 2012-09-09 ENCOUNTER — Encounter: Payer: Medicaid Other | Admitting: Family

## 2012-09-11 ENCOUNTER — Observation Stay: Payer: Self-pay | Admitting: Obstetrics & Gynecology

## 2012-09-16 ENCOUNTER — Ambulatory Visit (INDEPENDENT_AMBULATORY_CARE_PROVIDER_SITE_OTHER): Payer: Medicaid Other | Admitting: Advanced Practice Midwife

## 2012-09-16 ENCOUNTER — Inpatient Hospital Stay (HOSPITAL_COMMUNITY)
Admission: AD | Admit: 2012-09-16 | Discharge: 2012-09-17 | Disposition: A | Discharge status: Home / Self Care | Payer: Medicaid Other | Source: Ambulatory Visit | Attending: Obstetrics and Gynecology | Admitting: Obstetrics and Gynecology

## 2012-09-16 ENCOUNTER — Encounter (HOSPITAL_COMMUNITY): Payer: Self-pay

## 2012-09-16 VITALS — BP 126/77 | Temp 97.0°F | Wt 225.7 lb

## 2012-09-16 DIAGNOSIS — Z331 Pregnant state, incidental: Secondary | ICD-10-CM

## 2012-09-16 DIAGNOSIS — K219 Gastro-esophageal reflux disease without esophagitis: Secondary | ICD-10-CM

## 2012-09-16 DIAGNOSIS — Z3490 Encounter for supervision of normal pregnancy, unspecified, unspecified trimester: Secondary | ICD-10-CM

## 2012-09-16 DIAGNOSIS — O368190 Decreased fetal movements, unspecified trimester, not applicable or unspecified: Secondary | ICD-10-CM | POA: Insufficient documentation

## 2012-09-16 DIAGNOSIS — R7309 Other abnormal glucose: Secondary | ICD-10-CM

## 2012-09-16 DIAGNOSIS — O479 False labor, unspecified: Secondary | ICD-10-CM | POA: Insufficient documentation

## 2012-09-16 LAB — URINE MICROSCOPIC-ADD ON

## 2012-09-16 LAB — URINALYSIS, ROUTINE W REFLEX MICROSCOPIC
Bilirubin Urine: NEGATIVE
Nitrite: NEGATIVE
Specific Gravity, Urine: 1.02 (ref 1.005–1.030)
pH: 6.5 (ref 5.0–8.0)

## 2012-09-16 LAB — POCT URINALYSIS DIP (DEVICE)
Bilirubin Urine: NEGATIVE
Hgb urine dipstick: NEGATIVE
Ketones, ur: NEGATIVE mg/dL
Specific Gravity, Urine: 1.02 (ref 1.005–1.030)
pH: 6 (ref 5.0–8.0)

## 2012-09-16 NOTE — MAU Note (Signed)
Pt reports contractions that are 4-5 minutes apart, ? Leaking fluid.

## 2012-09-16 NOTE — Progress Notes (Signed)
Pulse- 92  Edema-feet  Pain/pressure-pelvic Pt reports going to [facility] for contractions went there on 09/11/12 and 09/04/12 On 09/04/12 [facility] tx'd pt for UTI and was given Macrobid 100 mg po qd x 7 days.  Tx completed

## 2012-09-16 NOTE — Progress Notes (Signed)
Doing well.  Good fetal movement, denies vaginal bleeding, LOF, regular contractions.  Reviewed signs of labor.  

## 2012-09-17 LAB — URINE CULTURE

## 2012-09-17 NOTE — MAU Provider Note (Signed)
History     CSN: [id number]  Arrival date and time: 09/16/12 2220   None     Chief Complaint  Patient presents with  . Labor Eval   HPI  Gina Hurley is a 22 y.o. G2P1001 at [redacted]w[redacted]d who presented to the MAU for a labor evaluation. After monitoring for several hours with three cervical checks one hour apart showing no cervical change and being told she was not in labor and thus could be discharged, patient began to endorse decreased fetal movement. She now states that this was one of the reasons she came to the MAU, despite reporting normal fetal movement to two separate nurses earlier. She reports that she has not felt the baby move since coming to the MAU tonight, and for a few hours prior to coming as well.   OB History   Grav Para Term Preterm Abortions TAB SAB Ect Mult Living   2 1 1       1       Past Medical History  Diagnosis Date  . No pertinent past medical history   . Mental disorder   . Bipolar 1 disorder     Past Surgical History  Procedure Laterality Date  . Tubes in ears    . Cholecystectomy  2012  . Adenoidectomy      as a child    Family History  Problem Relation Age of Onset  . Diabetes Father   . Hypertension Father   . Diabetes Maternal Aunt   . Hypertension Maternal Aunt   . Hypertension Maternal Uncle   . Hypertension Maternal Grandmother   . Asthma Paternal Grandmother     History  Substance Use Topics  . Smoking status: Current Some Day Smoker    Types: Cigarettes  . Smokeless tobacco: Never Used  . Alcohol Use: No    Allergies: No Known Allergies  Facility-administered medications prior to admission  Medication Dose Route Frequency Provider Last Rate Last Dose  . TDaP (BOOSTRIX) injection 0.5 mL  0.5 mL Intramuscular Once Minta Balsam, MD       Prescriptions prior to admission  Medication Sig Dispense Refill  . ondansetron (ZOFRAN ODT) 4 MG disintegrating tablet Take 1 tablet (4 mg total) by mouth every 8 (eight) hours as  needed for nausea.  30 tablet  1  . Prenatal Vit-Fe Fumarate-FA (PRENATAL COMPLETE) 14-0.4 MG TABS Take 1 tablet by mouth once.  60 each  0  . ranitidine (ZANTAC) 150 MG tablet Take 1 tablet (150 mg total) by mouth at bedtime.  60 tablet  1    ROS + contractions, decreased fetal movement  Physical Exam   Blood pressure 127/58, pulse 101, temperature 98.2 F (36.8 C), temperature source Oral, resp. rate 20, height 5\' 4"  (1.626 m), weight 225 lb (102.059 kg), last menstrual period 12/05/2011, SpO2 99.00%.  Physical Exam Gen: NAD Lungs:NWOB Neuro: grossly nonfocal, speech intact Dilation: 1 Effacement (%): 50 Cervical Position: Posterior Station: -3 Presentation: Vertex Exam by:: Lucy Chris RNC   MAU Course  Procedures  MDM FHR: baseline 130s, moderate variability, + accels, no decels Toco: irregular ctx q 3-5 min   Assessment and Plan  Gina Hurley is a 22 y.o. G2P1001 at [redacted]w[redacted]d who presented to MAU for labor evaluation, found not to be in active labor, with cervix persistently 1cm dilated over three separate checks. Now endorses decreased fetal movement. Question possible secondary gain of wanting admission to hospital, with patient reporting decreased FM only after  being told she was going to be discharged. Baby has looked excellent on the monitor throughout MAU course, with definite category 1 tracing. Discussed kick counts with patient, and offered for her to return if she still feels as though baby is not moving well. Stable for discharge at this time. Case discussed with Philipp Deputy CNM who agrees with this plan.  Levert Feinstein, MD Family Medicine PGY-2 09/17/2012, 1:14 AM   I have participated in the care of this patient and I agree with the above. Cam Hai 5:25 AM 09/17/2012

## 2012-09-17 NOTE — MAU Provider Note (Signed)
Attestation of Attending Supervision of Advanced Practitioner: Evaluation and management procedures were performed by the PA/NP/CNM/OB Fellow under my supervision/collaboration. Chart reviewed and agree with management and plan.  Jakeel Starliper V 09/17/2012 10:06 AM

## 2012-09-17 NOTE — Discharge Instructions (Signed)
Braxton Hicks Contractions °Pregnancy is commonly associated with contractions of the uterus throughout the pregnancy. Towards the end of pregnancy (32 to 34 weeks), these contractions (Braxton Hicks) can develop more often and may become more forceful. This is not true labor because these contractions do not result in opening (dilatation) and thinning of the cervix. They are sometimes difficult to tell apart from true labor because these contractions can be forceful and people have different pain tolerances. You should not feel embarrassed if you go to the hospital with false labor. Sometimes, the only way to tell if you are in true labor is for your caregiver to follow the changes in the cervix. °How to tell the difference between true and false labor: °· False labor. °· The contractions of false labor are usually shorter, irregular and not as hard as those of true labor. °· They are often felt in the front of the lower abdomen and in the groin. °· They may leave with walking around or changing positions while lying down. °· They get weaker and are shorter lasting as time goes on. °· These contractions are usually irregular. °· They do not usually become progressively stronger, regular and closer together as with true labor. °· True labor. °· Contractions in true labor last 30 to 70 seconds, become very regular, usually become more intense, and increase in frequency. °· They do not go away with walking. °· The discomfort is usually felt in the top of the uterus and spreads to the lower abdomen and low back. °· True labor can be determined by your caregiver with an exam. This will show that the cervix is dilating and getting thinner. °If there are no prenatal problems or other health problems associated with the pregnancy, it is completely safe to be sent home with false labor and await the onset of true labor. °HOME CARE INSTRUCTIONS  °· Keep up with your usual exercises and instructions. °· Take medications as  directed. °· Keep your regular prenatal appointment. °· Eat and drink lightly if you think you are going into labor. °· If BH contractions are making you uncomfortable: °· Change your activity position from lying down or resting to walking/walking to resting. °· Sit and rest in a tub of warm water. °· Drink 2 to 3 glasses of water. Dehydration may cause B-H contractions. °· Do slow and deep breathing several times an hour. °SEEK IMMEDIATE MEDICAL CARE IF:  °· Your contractions continue to become stronger, more regular, and closer together. °· You have a gushing, burst or leaking of fluid from the vagina. °· An oral temperature above 102° F (38.9° C) develops. °· You have passage of blood-tinged mucus. °· You develop vaginal bleeding. °· You develop continuous belly (abdominal) pain. °· You have low back pain that you never had before. °· You feel the baby's head pushing down causing pelvic pressure. °· The baby is not moving as much as it used to. °Document Released: 01/07/2005 Document Revised: 04/01/2011 Document Reviewed: 07/01/2008 °ExitCare® Patient Information ©2014 [employer]. ° ° °Fetal Movement Counts °Patient Name: __________________________________________________ Patient Due Date: ____________________ °Performing a fetal movement count is highly recommended in high-risk pregnancies, but it is good for every pregnant woman to do. Your caregiver may ask you to start counting fetal movements at 28 weeks of the pregnancy. Fetal movements often increase: °· After eating a full meal. °· After physical activity. °· After eating or drinking something sweet or cold. °· At rest. °Pay attention to when you   the baby is most active. This will help you notice a pattern of your baby's sleep and wake cycles and what factors contribute to an increase in fetal movement. It is important to perform a fetal movement count at the same time each day when your baby is normally most active.  HOW TO COUNT FETAL  MOVEMENTS 1. Find a quiet and comfortable area to sit or lie down on your left side. Lying on your left side provides the best blood and oxygen circulation to your baby. 2. Write down the day and time on a sheet of paper or in a journal. 3. Start counting kicks, flutters, swishes, rolls, or jabs in a 2 hour period. You should feel at least 10 movements within 2 hours. 4. If you do not feel 10 movements in 2 hours, wait 2 3 hours and count again. Look for a change in the pattern or not enough counts in 2 hours. SEEK MEDICAL CARE IF:  You feel less than 10 counts in 2 hours, tried twice.  There is no movement in over an hour.  The pattern is changing or taking longer each day to reach 10 counts in 2 hours.  You feel the baby is not moving as he or she usually does. Date: ____________ Movements: ____________ Start time: ____________ Finish time: ____________  Date: ____________ Movements: ____________ Start time: ____________ Finish time: ____________ Date: ____________ Movements: ____________ Start time: ____________ Finish time: ____________ Date: ____________ Movements: ____________ Start time: ____________ Finish time: ____________ Date: ____________ Movements: ____________ Start time: ____________ Finish time: ____________ Date: ____________ Movements: ____________ Start time: ____________ Finish time: ____________ Date: ____________ Movements: ____________ Start time: ____________ Finish time: ____________ Date: ____________ Movements: ____________ Start time: ____________ Finish time: ____________  Date: ____________ Movements: ____________ Start time: ____________ Finish time: ____________ Date: ____________ Movements: ____________ Start time: ____________ Finish time: ____________ Date: ____________ Movements: ____________ Start time: ____________ Finish time: ____________ Date: ____________ Movements: ____________ Start time: ____________ Finish time: ____________ Date: ____________  Movements: ____________ Start time: ____________ Finish time: ____________ Date: ____________ Movements: ____________ Start time: ____________ Finish time: ____________ Date: ____________ Movements: ____________ Start time: ____________ Finish time: ____________  Date: ____________ Movements: ____________ Start time: ____________ Finish time: ____________ Date: ____________ Movements: ____________ Start time: ____________ Finish time: ____________ Date: ____________ Movements: ____________ Start time: ____________ Finish time: ____________ Date: ____________ Movements: ____________ Start time: ____________ Finish time: ____________ Date: ____________ Movements: ____________ Start time: ____________ Finish time: ____________ Date: ____________ Movements: ____________ Start time: ____________ Finish time: ____________ Date: ____________ Movements: ____________ Start time: ____________ Finish time: ____________  Date: ____________ Movements: ____________ Start time: ____________ Finish time: ____________ Date: ____________ Movements: ____________ Start time: ____________ Finish time: ____________ Date: ____________ Movements: ____________ Start time: ____________ Finish time: ____________ Date: ____________ Movements: ____________ Start time: ____________ Finish time: ____________ Date: ____________ Movements: ____________ Start time: ____________ Finish time: ____________ Date: ____________ Movements: ____________ Start time: ____________ Finish time: ____________ Date: ____________ Movements: ____________ Start time: ____________ Finish time: ____________  Date: ____________ Movements: ____________ Start time: ____________ Finish time: ____________ Date: ____________ Movements: ____________ Start time: ____________ Finish time: ____________ Date: ____________ Movements: ____________ Start time: ____________ Finish time: ____________ Date: ____________ Movements: ____________ Start time:  ____________ Finish time: ____________ Date: ____________ Movements: ____________ Start time: ____________ Finish time: ____________ Date: ____________ Movements: ____________ Start time: ____________ Finish time: ____________ Date: ____________ Movements: ____________ Start time: ____________ Finish time: ____________  Date: ____________ Movements: ____________ Start time: ____________ Finish time: ____________ Date: ____________ Movements: ____________ Start   time: ____________ Finish time: ____________ Date: ____________ Movements: ____________ Start time: ____________ Finish time: ____________ Date: ____________ Movements: ____________ Start time: ____________ Finish time: ____________ Date: ____________ Movements: ____________ Start time: ____________ Finish time: ____________ Date: ____________ Movements: ____________ Start time: ____________ Finish time: ____________ Date: ____________ Movements: ____________ Start time: ____________ Finish time: ____________  Date: ____________ Movements: ____________ Start time: ____________ Finish time: ____________ Date: ____________ Movements: ____________ Start time: ____________ Finish time: ____________ Date: ____________ Movements: ____________ Start time: ____________ Finish time: ____________ Date: ____________ Movements: ____________ Start time: ____________ Finish time: ____________ Date: ____________ Movements: ____________ Start time: ____________ Finish time: ____________ Date: ____________ Movements: ____________ Start time: ____________ Finish time: ____________ Date: ____________ Movements: ____________ Start time: ____________ Finish time: ____________  Date: ____________ Movements: ____________ Start time: ____________ Finish time: ____________ Date: ____________ Movements: ____________ Start time: ____________ Finish time: ____________ Date: ____________ Movements: ____________ Start time: ____________ Finish time: ____________ Date:  ____________ Movements: ____________ Start time: ____________ Finish time: ____________ Date: ____________ Movements: ____________ Start time: ____________ Finish time: ____________ Date: ____________ Movements: ____________ Start time: ____________ Finish time: ____________ Document Released: 02/06/2006 Document Revised: 12/25/2011 Document Reviewed: 11/04/2011 Exit[employer] Patient Information 2014 [employer].  

## 2012-09-23 ENCOUNTER — Encounter: Payer: Self-pay | Admitting: Family Medicine

## 2012-09-23 ENCOUNTER — Ambulatory Visit (INDEPENDENT_AMBULATORY_CARE_PROVIDER_SITE_OTHER): Payer: Medicaid Other | Admitting: Family Medicine

## 2012-09-23 VITALS — BP 129/73 | Temp 98.0°F | Wt 226.4 lb

## 2012-09-23 DIAGNOSIS — M549 Dorsalgia, unspecified: Secondary | ICD-10-CM

## 2012-09-23 DIAGNOSIS — Z3490 Encounter for supervision of normal pregnancy, unspecified, unspecified trimester: Secondary | ICD-10-CM

## 2012-09-23 DIAGNOSIS — Z3483 Encounter for supervision of other normal pregnancy, third trimester: Secondary | ICD-10-CM

## 2012-09-23 DIAGNOSIS — Z3480 Encounter for supervision of other normal pregnancy, unspecified trimester: Secondary | ICD-10-CM

## 2012-09-23 LAB — POCT URINALYSIS DIP (DEVICE)
Bilirubin Urine: NEGATIVE
Glucose, UA: NEGATIVE mg/dL
Ketones, ur: NEGATIVE mg/dL
Nitrite: NEGATIVE
Specific Gravity, Urine: >=1.03 (ref 1.005–1.030)
pH: 5.5 (ref 5.0–8.0)

## 2012-09-23 NOTE — Progress Notes (Signed)
  Subjective:    Gina Hurley is a 22 y.o. female being seen today for her obstetrical visit. She is at [redacted]w[redacted]d gestation. Patient reports no bleeding, no contractions, no cramping and no leaking. Fetal movement: normal.  Menstrual History: OB History   Grav Para Term Preterm Abortions TAB SAB Ect Mult Living   2 1 1       1        Patient's last menstrual period was 12/05/2011.    The following portions of the patient's history were reviewed and updated as appropriate: allergies, current medications, past family history, past medical history, past social history, past surgical history and problem list.  Review of Systems Pertinent items are noted in HPI.   Objective:    BP 129/73  Temp(Src) 98 F (36.7 C)  Wt 102.694 kg (226 lb 6.4 oz)  BMI 38.84 kg/m2  LMP 12/05/2011 FHT: 150s BPM  Uterine Size: 39 cm  Presentations: cephalic  Pelvic Exam:              Dilation: 2cm       Effacement: 25%             Station:  -3    Consistency: medium            Position: posterior     Assessment:  Gina Hurley is a 22 y.o. G2P1001 at [redacted]w[redacted]d here for ROB Plan:     Gina Hurley is a 22 y.o. G2P1001 at [redacted]w[redacted]d  here for ROB visit. Negative (08/06 0000)  Discussed with Patient:  - Plans to Breast feed.  All questions answered. - Continue prenatal vitamins. - Reviewed fetal kick counts Pt to perform daily at a time when the baby is active, lie laterally with both hands on belly in quiet room and count all movements (hiccups, shoulder rolls, obvious kicks, etc); pt is to report to clinic MAU for less than 10 movements felt in a 2 hour time period-pt told as soon as she counts 10 movements the count is complete.  - Routine precautions discussed (depression, infection s/s).   Patient provided with all pertinent phone numbers for emergencies. - RTC for any VB, regular, painful cramps/ctxs occurring at a rate of >2/10 min, fever (100.5 or higher), n/v/d, any pain that is unresolving or  worsening, LOF, decreased fetal movement, CP, SOB, edema -RTC in one week for next visit.  Problems: Patient Active Problem List   Diagnosis Date Noted  . Pregnancy 09/01/2012  . GERD (gastroesophageal reflux disease) 08/26/2012  . Elevated glucose tolerance test 08/13/2012  . Back pain 09/06/2010   Membrane stripped today  To Do: 1. NTD  [ ]  Vaccines:   Tdap: recd [ ]  BCM: Nexplanon [ ]  Readiness: baby has a place to sleep, car seat, other baby necessities.  Edu: [ x] TL precautions; [ ]  BF class; [ ]  childbirth class; [ ]   BF counseling;

## 2012-09-23 NOTE — Patient Instructions (Signed)

## 2012-09-23 NOTE — Progress Notes (Signed)
Pulse-  95 

## 2012-09-26 ENCOUNTER — Inpatient Hospital Stay (HOSPITAL_COMMUNITY)
Admission: AD | Admit: 2012-09-26 | Discharge: 2012-09-27 | Disposition: A | Discharge status: Home / Self Care | Payer: Medicaid Other | Source: Ambulatory Visit | Attending: Family Medicine | Admitting: Family Medicine

## 2012-09-26 ENCOUNTER — Encounter (HOSPITAL_COMMUNITY): Payer: Self-pay | Admitting: *Deleted

## 2012-09-26 DIAGNOSIS — K219 Gastro-esophageal reflux disease without esophagitis: Secondary | ICD-10-CM

## 2012-09-26 DIAGNOSIS — R7309 Other abnormal glucose: Secondary | ICD-10-CM

## 2012-09-26 DIAGNOSIS — O479 False labor, unspecified: Secondary | ICD-10-CM | POA: Insufficient documentation

## 2012-09-26 NOTE — MAU Note (Addendum)
PT HAS ARRIVED VIA EMS- SAYING  UC HURT BAD AT  930PM.    SAYS WAS 2 CM  IN CLINIC- ON  WED.-- THEY ALSO STRIPPED MEMBRANES-    PT THINKS  MAYBE SROM   DENIES HSV AND MRSA.

## 2012-09-26 NOTE — MAU Provider Note (Signed)
History     CSN: [id number]  Arrival date and time: 09/26/12 2324   None     No chief complaint on file.  HPI Gina Hurley is a 22 y.o. G42P1001 female @ [redacted]w[redacted]d who arrived via EMS w/ report of leakage of fluid since membranes were swept in office on wed. Cx was 2cm per pt at that time. UCs began tonight around 2100. Had a small amount of spotting w/ wiping before she called EMS, none since. Last sexual intercourse 'awhile ago'. Reports good fm. Slightly malodorous d/c over last few days, 'have had to take 2 showers/day'. No vulvovaginal itching/irritation. Pregnancy complicated by late onset care @ 33wks, and abnormal 1hr glucola, but normal 3hr gtt. Next appt in clinic on Wed.   OB History   Grav Para Term Preterm Abortions TAB SAB Ect Mult Living   2 1 1       1       Past Medical History  Diagnosis Date  . No pertinent past medical history   . Mental disorder   . Bipolar 1 disorder     Past Surgical History  Procedure Laterality Date  . Tubes in ears    . Cholecystectomy  2012  . Adenoidectomy      as a child    Family History  Problem Relation Age of Onset  . Diabetes Father   . Hypertension Father   . Diabetes Maternal Aunt   . Hypertension Maternal Aunt   . Hypertension Maternal Uncle   . Hypertension Maternal Grandmother   . Asthma Paternal Grandmother     History  Substance Use Topics  . Smoking status: Current Some Day Smoker    Types: Cigarettes  . Smokeless tobacco: Never Used  . Alcohol Use: No    Allergies: No Known Allergies  Facility-administered medications prior to admission  Medication Dose Route Frequency Provider Last Rate Last Dose  . TDaP (BOOSTRIX) injection 0.5 mL  0.5 mL Intramuscular Once Minta Balsam, MD       Prescriptions prior to admission  Medication Sig Dispense Refill  . ondansetron (ZOFRAN ODT) 4 MG disintegrating tablet Take 1 tablet (4 mg total) by mouth every 8 (eight) hours as needed for nausea.  30 tablet  1  .  Prenatal Vit-Fe Fumarate-FA (PRENATAL COMPLETE) 14-0.4 MG TABS Take 1 tablet by mouth once.  60 each  0  . ranitidine (ZANTAC) 150 MG tablet Take 1 tablet (150 mg total) by mouth at bedtime.  60 tablet  1    Review of Systems  Constitutional: Negative.   HENT: Negative.   Eyes: Negative.   Respiratory: Negative.   Cardiovascular: Negative.   Gastrointestinal: Positive for abdominal pain (uc's).  Genitourinary: Negative.   Musculoskeletal: Negative.   Skin: Negative.   Neurological: Negative.   Endo/Heme/Allergies: Negative.   Psychiatric/Behavioral: Negative.    Physical Exam   Blood pressure 118/64, pulse 89, temperature 97.2 F (36.2 C), temperature source Oral, resp. rate 20, last menstrual period 12/05/2011.  Physical Exam  Constitutional: She is oriented to person, place, and time. She appears well-developed and well-nourished.  HENT:  Head: Normocephalic.  Neck: Normal range of motion.  Cardiovascular: Normal rate.   Respiratory: Effort normal.  GI: Soft.  gravid  Genitourinary:  SSE: cx visually closed, mod amount thick clumpy white d/c, slight odor. Some thin white fluid in posterior fornix.  SVE: tight 3/50/-3, vtx  Musculoskeletal: Normal range of motion.  Neurological: She is alert and oriented to person,  place, and time.  Skin: Skin is warm and dry.  Psychiatric: She has a normal mood and affect. Her behavior is normal. Judgment and thought content normal.   FHR: 145, mod variability, 15x15accels, no decels=Cat I UCs: mild, q MAU Course  Procedures  NST SSE w/ wet prep and fern-indeterminate, amnisure SVE x2, no change >1hr after 1st exam  Results for orders placed in visit on 09/23/12 (from the past 24 hour(s))  WET PREP, GENITAL     Status: Abnormal   Collection Time    09/26/12 11:55 PM      Result Value Range   Yeast Wet Prep HPF POC NONE SEEN  NONE SEEN   Trich, Wet Prep NONE SEEN  NONE SEEN   Clue Cells Wet Prep HPF POC FEW (*) NONE SEEN    WBC, Wet Prep HPF POC MODERATE (*) NONE SEEN  AMNISURE RUPTURE OF MEMBRANE (ROM)     Status: None   Collection Time    09/27/12 12:15 AM      Result Value Range   Amnisure ROM NEGATIVE      Assessment and Plan  A:  [redacted]w[redacted]d SIUP  G2P1001   Braxton hicks vs. Early labor  Intact membranes  Cat I FHR  GBS neg  Few clues, aysmptomatic- will defer tx   P:  D/C home  Keep appt in clinic on Wed as scheduled  Labor precautions and FKC given by RN  Grace Bushy, Cheron Every 09/26/2012, 11:47 PM

## 2012-09-27 LAB — WET PREP, GENITAL: Trich, Wet Prep: NONE SEEN

## 2012-09-27 NOTE — MAU Provider Note (Signed)
Chart reviewed and agree with management and plan.  

## 2012-09-28 ENCOUNTER — Encounter (HOSPITAL_COMMUNITY): Payer: Self-pay | Admitting: Anesthesiology

## 2012-09-28 ENCOUNTER — Inpatient Hospital Stay (HOSPITAL_COMMUNITY): Payer: Medicaid Other | Admitting: Anesthesiology

## 2012-09-28 ENCOUNTER — Inpatient Hospital Stay (HOSPITAL_COMMUNITY)
Admission: AD | Admit: 2012-09-28 | Discharge: 2012-09-30 | DRG: 775 | Disposition: A | Discharge status: Home / Self Care | Payer: Medicaid Other | Source: Ambulatory Visit | Attending: Obstetrics & Gynecology | Admitting: Obstetrics & Gynecology

## 2012-09-28 ENCOUNTER — Encounter (HOSPITAL_COMMUNITY): Payer: Self-pay | Admitting: *Deleted

## 2012-09-28 HISTORY — DX: Disease of gallbladder, unspecified: K82.9

## 2012-09-28 LAB — CBC
HCT: 32.4 % — ABNORMAL LOW (ref 36.0–46.0)
Hemoglobin: 11.3 g/dL — ABNORMAL LOW (ref 12.0–15.0)
MCH: 30 pg (ref 26.0–34.0)
MCHC: 34.9 g/dL (ref 30.0–36.0)
MCV: 85.9 fL (ref 78.0–100.0)
Platelets: 216 10*3/uL (ref 150–400)
RBC: 3.77 MIL/uL — ABNORMAL LOW (ref 3.87–5.11)
RDW: 13.5 % (ref 11.5–15.5)
WBC: 11.5 10*3/uL — ABNORMAL HIGH (ref 4.0–10.5)

## 2012-09-28 LAB — TYPE AND SCREEN: Antibody Screen: NEGATIVE

## 2012-09-28 LAB — RPR: RPR Ser Ql: NONREACTIVE

## 2012-09-28 LAB — ABO/RH: ABO/RH(D): A POS

## 2012-09-28 MED ORDER — OXYCODONE-ACETAMINOPHEN 5-325 MG PO TABS: 1.0000 | ORAL_TABLET | ORAL | Status: DC | PRN

## 2012-09-28 MED ORDER — LANOLIN HYDROUS EX OINT: TOPICAL_OINTMENT | CUTANEOUS | Status: DC | PRN

## 2012-09-28 MED ORDER — WITCH HAZEL-GLYCERIN EX PADS: 1.0000 "application " | MEDICATED_PAD | CUTANEOUS | Status: DC | PRN

## 2012-09-28 MED ORDER — ONDANSETRON HCL 4 MG/2ML IJ SOLN: 4.0000 mg | Freq: Four times a day (QID) | INTRAMUSCULAR | Status: DC | PRN

## 2012-09-28 MED ORDER — EPHEDRINE 5 MG/ML INJ
10.0000 mg | INTRAVENOUS | Status: DC | PRN
  Filled 2012-09-28: qty 4

## 2012-09-28 MED ORDER — FENTANYL CITRATE 0.05 MG/ML IJ SOLN
100.0000 ug | INTRAMUSCULAR | Status: DC | PRN
  Administered 2012-09-28: 100 ug via INTRAVENOUS
  Filled 2012-09-28: qty 2

## 2012-09-28 MED ORDER — ONDANSETRON HCL 4 MG PO TABS: 4.0000 mg | ORAL_TABLET | ORAL | Status: DC | PRN

## 2012-09-28 MED ORDER — LACTATED RINGERS IV SOLN: 500.0000 mL | Freq: Once | INTRAVENOUS | Status: DC

## 2012-09-28 MED ORDER — DIBUCAINE 1 % RE OINT: 1.0000 "application " | TOPICAL_OINTMENT | RECTAL | Status: DC | PRN

## 2012-09-28 MED ORDER — BENZOCAINE-MENTHOL 20-0.5 % EX AERO: 1.0000 "application " | INHALATION_SPRAY | CUTANEOUS | Status: DC | PRN

## 2012-09-28 MED ORDER — SIMETHICONE 80 MG PO CHEW: 80.0000 mg | CHEWABLE_TABLET | ORAL | Status: DC | PRN

## 2012-09-28 MED ORDER — DIPHENHYDRAMINE HCL 25 MG PO CAPS
25.0000 mg | ORAL_CAPSULE | Freq: Four times a day (QID) | ORAL | Status: DC | PRN
  Administered 2012-09-28: 25 mg via ORAL

## 2012-09-28 MED ORDER — FENTANYL 2.5 MCG/ML BUPIVACAINE 1/10 % EPIDURAL INFUSION (WH - ANES)
14.0000 mL/h | INTRAMUSCULAR | Status: DC | PRN
  Administered 2012-09-28: 14 mL/h via EPIDURAL
  Filled 2012-09-28: qty 125

## 2012-09-28 MED ORDER — SODIUM BICARBONATE 8.4 % IV SOLN
INTRAVENOUS | Status: DC | PRN
  Administered 2012-09-28: 5 mL via EPIDURAL

## 2012-09-28 MED ORDER — OXYCODONE-ACETAMINOPHEN 5-325 MG PO TABS
1.0000 | ORAL_TABLET | ORAL | Status: DC | PRN
  Administered 2012-09-29 – 2012-09-30 (×3): 1 via ORAL
  Filled 2012-09-28 (×3): qty 1

## 2012-09-28 MED ORDER — TERBUTALINE SULFATE 1 MG/ML IJ SOLN: 0.2500 mg | Freq: Once | INTRAMUSCULAR | Status: DC | PRN

## 2012-09-28 MED ORDER — IBUPROFEN 600 MG PO TABS
600.0000 mg | ORAL_TABLET | Freq: Four times a day (QID) | ORAL | Status: DC | PRN
  Administered 2012-09-28: 600 mg via ORAL
  Filled 2012-09-28: qty 1

## 2012-09-28 MED ORDER — TETANUS-DIPHTH-ACELL PERTUSSIS 5-2.5-18.5 LF-MCG/0.5 IM SUSP: 0.5000 mL | Freq: Once | INTRAMUSCULAR | Status: DC

## 2012-09-28 MED ORDER — EPHEDRINE 5 MG/ML INJ: 10.0000 mg | INTRAVENOUS | Status: DC | PRN

## 2012-09-28 MED ORDER — ACETAMINOPHEN 325 MG PO TABS: 650.0000 mg | ORAL_TABLET | ORAL | Status: DC | PRN

## 2012-09-28 MED ORDER — LIDOCAINE HCL (PF) 1 % IJ SOLN
30.0000 mL | INTRAMUSCULAR | Status: DC | PRN
  Filled 2012-09-28: qty 30

## 2012-09-28 MED ORDER — SENNOSIDES-DOCUSATE SODIUM 8.6-50 MG PO TABS
2.0000 | ORAL_TABLET | Freq: Every day | ORAL | Status: DC
  Administered 2012-09-29: 2 via ORAL

## 2012-09-28 MED ORDER — LACTATED RINGERS IV SOLN
INTRAVENOUS | Status: DC
  Administered 2012-09-28 (×3): via INTRAVENOUS

## 2012-09-28 MED ORDER — ZOLPIDEM TARTRATE 5 MG PO TABS: 5.0000 mg | ORAL_TABLET | Freq: Every evening | ORAL | Status: DC | PRN

## 2012-09-28 MED ORDER — PRENATAL MULTIVITAMIN CH
1.0000 | ORAL_TABLET | Freq: Every day | ORAL | Status: DC
  Administered 2012-09-29 – 2012-09-30 (×2): 1 via ORAL
  Filled 2012-09-28 (×2): qty 1

## 2012-09-28 MED ORDER — IBUPROFEN 600 MG PO TABS
600.0000 mg | ORAL_TABLET | Freq: Four times a day (QID) | ORAL | Status: DC
  Administered 2012-09-29 – 2012-09-30 (×7): 600 mg via ORAL
  Filled 2012-09-28 (×7): qty 1

## 2012-09-28 MED ORDER — MEASLES, MUMPS & RUBELLA VAC ~~LOC~~ INJ: 0.5000 mL | INJECTION | Freq: Once | SUBCUTANEOUS | Status: DC

## 2012-09-28 MED ORDER — OXYTOCIN 40 UNITS IN LACTATED RINGERS INFUSION - SIMPLE MED
1.0000 m[IU]/min | INTRAVENOUS | Status: DC
  Administered 2012-09-28: 2 m[IU]/min via INTRAVENOUS
  Filled 2012-09-28: qty 1000

## 2012-09-28 MED ORDER — FLEET ENEMA 7-19 GM/118ML RE ENEM: 1.0000 | ENEMA | RECTAL | Status: DC | PRN

## 2012-09-28 MED ORDER — ONDANSETRON HCL 4 MG/2ML IJ SOLN: 4.0000 mg | INTRAMUSCULAR | Status: DC | PRN

## 2012-09-28 MED ORDER — DIPHENHYDRAMINE HCL 50 MG/ML IJ SOLN: 12.5000 mg | INTRAMUSCULAR | Status: DC | PRN

## 2012-09-28 MED ORDER — OXYTOCIN BOLUS FROM INFUSION
500.0000 mL | INTRAVENOUS | Status: DC
  Administered 2012-09-28: 500 mL via INTRAVENOUS

## 2012-09-28 MED ORDER — PHENYLEPHRINE 40 MCG/ML (10ML) SYRINGE FOR IV PUSH (FOR BLOOD PRESSURE SUPPORT): 80.0000 ug | PREFILLED_SYRINGE | INTRAVENOUS | Status: DC | PRN

## 2012-09-28 MED ORDER — CITRIC ACID-SODIUM CITRATE 334-500 MG/5ML PO SOLN: 30.0000 mL | ORAL | Status: DC | PRN

## 2012-09-28 MED ORDER — METHYLERGONOVINE MALEATE 0.2 MG/ML IJ SOLN
0.2000 mg | Freq: Once | INTRAMUSCULAR | Status: AC
  Administered 2012-09-28: 0.2 mg via INTRAMUSCULAR
  Filled 2012-09-28: qty 1

## 2012-09-28 MED ORDER — OXYTOCIN 40 UNITS IN LACTATED RINGERS INFUSION - SIMPLE MED: 62.5000 mL/h | INTRAVENOUS | Status: DC

## 2012-09-28 MED ORDER — PHENYLEPHRINE 40 MCG/ML (10ML) SYRINGE FOR IV PUSH (FOR BLOOD PRESSURE SUPPORT)
80.0000 ug | PREFILLED_SYRINGE | INTRAVENOUS | Status: DC | PRN
  Filled 2012-09-28: qty 5

## 2012-09-28 MED ORDER — LACTATED RINGERS IV SOLN
500.0000 mL | INTRAVENOUS | Status: DC | PRN
  Administered 2012-09-28: 1000 mL via INTRAVENOUS

## 2012-09-28 NOTE — Progress Notes (Signed)
Baby and Me booklet given and discussed and all forms  signed and dated.

## 2012-09-28 NOTE — Anesthesia Procedure Notes (Signed)

## 2012-09-28 NOTE — Progress Notes (Signed)
Gina Hurley is a 22 y.o. G2P1001 at [redacted]w[redacted]d  admitted for rupture of membranes  Subjective: Pt feleing contractions. S/p fentanyl x1 which helped some. +FM. +LOF.   Objective: BP 144/69  Pulse 80  Temp(Src) 98.5 F (36.9 C) (Oral)  Resp 18  Ht 5\' 4"  (1.626 m)  Wt 102.513 kg (226 lb)  BMI 38.77 kg/m2  SpO2 99%  LMP 12/05/2011      FHT:  FHR: 130 bpm, variability: moderate,  accelerations:  Present,  decelerations:  Absent UC:   Reg every 2-3 SVE:   Dilation: 4 Effacement (%): 70 Station: -2 Exam by:: dr. Reola Calkins  Labs: Lab Results  Component Value Date   WBC 11.5* 09/28/2012   HGB 11.3* 09/28/2012   HCT 32.4* 09/28/2012   MCV 85.9 09/28/2012   PLT 216 09/28/2012    Assessment / Plan: Augmentation of labor, progressing well  Labor: progressing on pitocin Fetal Wellbeing:  Category I Pain Control:  Fentanyl I/D:  n/a Anticipated MOD:  NSVD  Gina Hurley L 09/28/2012, 2:22 PM

## 2012-09-28 NOTE — H&P (Signed)
Gina Hurley is a 22 y.o. female G2P1001 with IUP at [redacted]w[redacted]d by early [place] presenting for SROM.  Pt states she woke up this AM around 6am and had a large gush of fluid- clear.  Came in and had a + fern.  Irregular occasional contractions.  No VB. +FM.     [facility] at [facility] since 31 wks. Uncomplicated care.  Neg GBS. Normal anatomy. Normal 3 hour.   Prenatal History/Complications:  Past Medical History: Past Medical History  Diagnosis Date  . No pertinent past medical history   . Mental disorder   . Bipolar 1 disorder   . Gallbladder attack     Past Surgical History: Past Surgical History  Procedure Laterality Date  . Tubes in ears    . Cholecystectomy  2012  . Adenoidectomy      as a child    Obstetrical History: OB History   Grav Para Term Preterm Abortions TAB SAB Ect Mult Living   2 1 1       1     G1- NSVD, 8lb4oz     Social History: History   Social History  . Marital Status: Single    Spouse Name: N/A    Number of Children: N/A  . Years of Education: N/A   Social History Main Topics  . Smoking status: Current Some Day Smoker    Types: Cigarettes  . Smokeless tobacco: Never Used  . Alcohol Use: No  . Drug Use: No  . Sexual Activity: Yes     Comment: SAYS QUIT   4 WEEKS AGO    Other Topics Concern  . None   Social History Narrative  . None    Family History: Family History  Problem Relation Age of Onset  . Diabetes Father   . Hypertension Father   . Diabetes Maternal Aunt   . Hypertension Maternal Aunt   . Hypertension Maternal Uncle   . Hypertension Maternal Grandmother   . Asthma Paternal Grandmother     Allergies: No Known Allergies  Facility-administered medications prior to admission  Medication Dose Route Frequency Provider Last Rate Last Dose  . TDaP (BOOSTRIX) injection 0.5 mL  0.5 mL Intramuscular Once Minta Balsam, MD       Prescriptions prior to admission  Medication Sig Dispense Refill  . acetaminophen (TYLENOL) 500 MG tablet  Take 500 mg by mouth every 6 (six) hours as needed for pain.      Marland Kitchen ondansetron (ZOFRAN ODT) 4 MG disintegrating tablet Take 1 tablet (4 mg total) by mouth every 8 (eight) hours as needed for nausea.  30 tablet  1  . Prenatal Vit-Fe Fumarate-FA (PRENATAL COMPLETE) 14-0.4 MG TABS Take 1 tablet by mouth once.  60 each  0  . ranitidine (ZANTAC) 150 MG tablet Take 1 tablet (150 mg total) by mouth at bedtime.  60 tablet  1     Review of Systems   Constitutional: Negative for fever, chills, weight loss, malaise/fatigue and diaphoresis.  HENT: Negative for hearing loss, ear pain, nosebleeds, congestion, sore throat, neck pain, tinnitus and ear discharge.   Eyes: Negative for blurred vision, double vision, photophobia, pain, discharge and redness.  Respiratory: Negative for cough, hemoptysis, sputum production, shortness of breath, wheezing and stridor.   Cardiovascular: Negative for chest pain, palpitations, orthopnea,  leg swelling  Gastrointestinal: Positive for abdominal pain. Negative for heartburn, nausea, vomiting, diarrhea, constipation, blood in stool Genitourinary: Negative for dysuria, urgency, frequency, hematuria and flank pain.  Musculoskeletal: Negative for  myalgias, back pain, joint pain and falls.  Skin: Negative for itching and rash.  Neurological: Negative for dizziness, tingling, tremors, sensory change, speech change, focal weakness, seizures, loss of consciousness, weakness and headaches.  Endo/Heme/Allergies: Negative for environmental allergies and polydipsia. Does not bruise/bleed easily.  Psychiatric/Behavioral: Negative for depression, suicidal ideas, hallucinations, memory loss and substance abuse. The patient is not nervous/anxious and does not have insomnia.       Blood pressure 115/71, pulse 84, temperature 97.9 F (36.6 C), temperature source Oral, resp. rate 18, height 5\' 4"  (1.626 m), weight 102.513 kg (226 lb), last menstrual period 12/05/2011, SpO2  99.00%. General appearance: alert, cooperative and no distress Lungs: clear to auscultation bilaterally Heart: regular rate and rhythm Abdomen: soft, non-tender; bowel sounds normal Pelvic: 3/50/-2 Extremities: Homans sign is negative, no sign of DVT Presentation: cephalic Fetal monitoringBaseline: 140 bpm, Variability: Good {> 6 bpm), Accelerations: Non-reactive but appropriate for gestational age and Decelerations: Early x1 Uterine activity contractions irregular every 5-74min  Dilation: 3 Effacement (%): 50 Station: -2 Exam by:: Dr. Reola Calkins   Prenatal labs: ABO, Rh: --/--/A POS (09/08 0820) Antibody: NEG (09/08 0820) Rubella:   RPR: NON REAC (07/09 1138)  HBsAg: NEGATIVE (07/09 1138)  HIV: NON REACTIVE (07/09 1138)  GBS: Negative (08/06 0000)  3 hr Glucola 92/164/95/99 Genetic screening  Too late Anatomy US normal     Assessment: Gina Hurley is a 22 y.o. G2P1001 with an IUP at [redacted]w[redacted]d presenting for SROM.   Plan: #admit to L&D - routine orders - epidural PRN - augment with ppit with protocol  #FWB - category I tracing - GBS neg -EFW 8.5lb  Anticipate SVD    Delmont Prosch L, MD 09/28/2012, 9:56 AM

## 2012-09-28 NOTE — Progress Notes (Signed)
Pt given informational guides andall forms on admission paperwork completed. Infant held per mother. Pt up to bathroom with assist and voided QS. pericare done per pt. And ambulating without difficulty. No lightheadedness or dizziness verbalized when asked.

## 2012-09-28 NOTE — Progress Notes (Signed)
Gina Hurley is a 22 y.o. G2P1001 at [redacted]w[redacted]d with SROM and now augmented with Pitocin  Subjective: Doing well and comfortable with epidural. No new concerns. Pitocin at 12.   Objective: BP 110/77  Pulse 95  Temp(Src) 98.5 F (36.9 C) (Oral)  Resp 18  Ht 5\' 4"  (1.626 m)  Wt 102.513 kg (226 lb)  BMI 38.77 kg/m2  SpO2 100%  LMP 12/05/2011     FHT:  FHR: 140 bpm, variability: moderate,  accelerations:  Present,  decelerations:  Absent UC:   regular, every 1-2 minutes SVE:   Dilation: 6 Effacement (%): 80 Station: -2 Exam by:: Madie Reno, MD  Labs: Lab Results  Component Value Date   WBC 11.5* 09/28/2012   HGB 11.3* 09/28/2012   HCT 32.4* 09/28/2012   MCV 85.9 09/28/2012   PLT 216 09/28/2012    Assessment / Plan: Spontaneous labor, progressing normally  Labor: Progressing normally Preeclampsia:  N/A Fetal Wellbeing:  Category I Pain Control:  Epidural I/D:  n/a Anticipated MOD:  NSVD  Awad Gladd L 09/28/2012, 5:15 PM

## 2012-09-28 NOTE — MAU Note (Signed)
Pt states she felt a large gush of vaginal fluid this morning about 0600 which was clear.  Irregular U/C's every 1-5 minutes.  Pt states since possible ROM baby isn't moving as much but still feels fetal movement.

## 2012-09-28 NOTE — Anesthesia Preprocedure Evaluation (Signed)
Anesthesia Evaluation  Patient identified by MRN, date of birth, ID band Patient awake    Reviewed: Allergy & Precautions, H&P , Patient's Chart, lab work & pertinent test results  Airway Mallampati: II  TM Distance: >3 FB Neck ROM: full    Dental  (+) Teeth Intact   Pulmonary  breath sounds clear to auscultation        Cardiovascular Rhythm:regular Rate:Normal     Neuro/Psych    GI/Hepatic GERD-  ,  Endo/Other  Morbid obesity  Renal/GU      Musculoskeletal   Abdominal   Peds  Hematology   Anesthesia Other Findings       Reproductive/Obstetrics (+) Pregnancy                             Anesthesia Physical Anesthesia Plan  ASA: III  Anesthesia Plan: Epidural   Post-op Pain Management:    Induction:   Airway Management Planned:   Additional Equipment:   Intra-op Plan:   Post-operative Plan:   Informed Consent: I have reviewed the patients History and Physical, chart, labs and discussed the procedure including the risks, benefits and alternatives for the proposed anesthesia with the patient or authorized representative who has indicated his/her understanding and acceptance.   Dental Advisory Given  Plan Discussed with:   Anesthesia Plan Comments: (Labs checked- platelets confirmed with RN in room. Fetal heart tracing, per RN, reported to be stable enough for sitting procedure. Discussed epidural, and patient consents to the procedure:  included risk of possible headache,backache, failed block, allergic reaction, and nerve injury. This patient was asked if she had any questions or concerns before the procedure started.)        Anesthesia Quick Evaluation  

## 2012-09-29 LAB — CBC
MCHC: 34.8 g/dL (ref 30.0–36.0)
MCV: 85.9 fL (ref 78.0–100.0)
Platelets: 209 10*3/uL (ref 150–400)
RDW: 13.6 % (ref 11.5–15.5)
WBC: 13.6 10*3/uL — ABNORMAL HIGH (ref 4.0–10.5)

## 2012-09-29 MED ORDER — PNEUMOCOCCAL VAC POLYVALENT 25 MCG/0.5ML IJ INJ
0.5000 mL | INJECTION | Freq: Once | INTRAMUSCULAR | Status: AC
  Administered 2012-09-30: 0.5 mL via INTRAMUSCULAR
  Filled 2012-09-29: qty 0.5

## 2012-09-29 MED ORDER — DULOXETINE HCL 20 MG PO CPEP: 20.0000 mg | ORAL_CAPSULE | Freq: Every day | ORAL | Status: DC

## 2012-09-29 MED ORDER — FLUOXETINE HCL 20 MG PO CAPS
20.0000 mg | ORAL_CAPSULE | Freq: Every day | ORAL | Status: DC
  Administered 2012-09-29 – 2012-09-30 (×2): 20 mg via ORAL
  Filled 2012-09-29 (×2): qty 1

## 2012-09-29 NOTE — Anesthesia Postprocedure Evaluation (Signed)
  Anesthesia Post-op Note  Patient: Gina Hurley  Procedure(s) Performed: * No procedures listed *  Patient Location: PACU and Mother/Baby  Anesthesia Type:Epidural  Level of Consciousness: awake, alert  and oriented  Airway and Oxygen Therapy: Patient Spontanous Breathing  Post-op Pain: none  Post-op Assessment: Post-op Vital signs reviewed, Patient's Cardiovascular Status Stable, No headache, No backache, No residual numbness and No residual motor weakness  Post-op Vital Signs: Reviewed and stable  Complications: No apparent anesthesia complications
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
## 2012-09-29 NOTE — Progress Notes (Signed)
Post Partum Day #1 Subjective: Gina Hurley is currently feeling well with lochia comparable to her period. Her abdominal pain is crampy in nature and is adequately controlled with Motrin and Percocet. She is voiding of urine, but has not had a BM since giving birth. She denies N, V, and CP; however does feel dizzy when she stands.  Objective: Blood pressure 116/62, pulse 87, temperature 97.4 F (36.3 C), temperature source Oral, resp. rate 18, height 5\' 4"  (1.626 m), weight 102.513 kg (226 lb), last menstrual period 12/05/2011, SpO2 97.00%, unknown if currently breastfeeding.  Physical Exam:  General: alert, cooperative and no distress Lochia: appropriate Uterine Fundus: firm Incision:N/A DVT Evaluation: No evidence of DVT seen on physical exam. Negative Homan's sign. No significant calf/ankle edema.   Recent Labs  09/28/12 0810 09/29/12 0610  HGB 11.3* 10.2*  HCT 32.4* 29.3*    Assessment/Plan: Gina Hurley is a 22 y.o. G38P2002 female who is PPD#1 s/p NSVD which was complicated by retained POC, but is doing well now. She plans on bottle feeding her newborn boy, who she will have circumcised in an OP clinic. She plans on using Nexplanon as contraception. Patient feels comfortable being D/C'ed tomorrow, pending an improvement in her dizziness.    LOS: 1 day   Gina Hurley 09/29/2012, 7:36 AM   Seen and examined by me also Agree with note Wynelle Bourgeois CNM

## 2012-09-30 ENCOUNTER — Encounter: Payer: Medicaid Other | Admitting: Family Medicine

## 2012-09-30 MED ORDER — IBUPROFEN 600 MG PO TABS: 600.0000 mg | ORAL_TABLET | Freq: Four times a day (QID) | ORAL | Status: DC
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
## 2012-09-30 NOTE — Discharge Summary (Signed)
Obstetric Discharge Summary Reason for Admission: rupture of membranes Prenatal Procedures: none Intrapartum Procedures: spontaneous vaginal delivery Postpartum Procedures: none Complications-Operative and Postpartum: retained POC, which have been removed. Hemoglobin  Date Value Range Status  09/29/2012 10.2* 12.0 - 15.0 g/dL Final     HCT  Date Value Range Status  09/29/2012 29.3* 36.0 - 46.0 % Final    Physical Exam:  General: alert, cooperative and no distress Lochia: appropriate Uterine Fundus: firm Incision: N/a  DVT Evaluation: No evidence of DVT seen on physical exam. Negative Homan's sign. No significant calf/ankle edema.  Discharge Diagnoses: Term Pregnancy-delivered  Discharge Information: Date: 09/30/2012 Activity: unrestricted Diet: routine Medications: Ibuprofen and Percocet Condition: stable Instructions: refer to practice specific booklet Discharge to: home  Gina Hurley is a 22 y.o. G22P2002 female who presented with LOF and contractions and progressed to NSVD. Delivery was complicated by retained POC, which has been removed. The patient is doing well now, is active. She denies dizziness, N, V, and CP. Pain is adequately controlled with Prcocet and Ibuprofen. She plans on bottle feeding her newborn boy, who she will have circumcised in an OP clinic. She plans on using Nexplanon as contraception. Patient feels comfortable going home today.   Newborn Data: Live born female  Birth Weight: 8 lb 14.2 oz (4030 g) APGAR: 9, 9  Home with mother.  [name] 09/30/2012, 7:46 AM  I was present for the exam and agree with above.  Ollie, CNM 09/30/2012 9:16 AM

## 2012-10-26 ENCOUNTER — Ambulatory Visit: Payer: Medicaid Other | Admitting: Obstetrics & Gynecology

## 2013-02-08 ENCOUNTER — Emergency Department (HOSPITAL_COMMUNITY): Payer: Medicaid Other

## 2013-02-08 ENCOUNTER — Encounter (HOSPITAL_COMMUNITY): Payer: Self-pay | Admitting: Emergency Medicine

## 2013-02-08 ENCOUNTER — Ambulatory Visit: Payer: Medicaid Other | Admitting: Family Medicine

## 2013-02-08 ENCOUNTER — Emergency Department (HOSPITAL_COMMUNITY)
Admission: EM | Admit: 2013-02-08 | Discharge: 2013-02-08 | Disposition: A | Discharge status: Home / Self Care | Payer: Medicaid Other | Attending: Emergency Medicine | Admitting: Emergency Medicine

## 2013-02-08 DIAGNOSIS — S61509A Unspecified open wound of unspecified wrist, initial encounter: Secondary | ICD-10-CM | POA: Insufficient documentation

## 2013-02-08 DIAGNOSIS — S0003XA Contusion of scalp, initial encounter: Secondary | ICD-10-CM | POA: Insufficient documentation

## 2013-02-08 DIAGNOSIS — M542 Cervicalgia: Secondary | ICD-10-CM | POA: Insufficient documentation

## 2013-02-08 DIAGNOSIS — S63509A Unspecified sprain of unspecified wrist, initial encounter: Secondary | ICD-10-CM | POA: Insufficient documentation

## 2013-02-08 DIAGNOSIS — W108XXA Fall (on) (from) other stairs and steps, initial encounter: Secondary | ICD-10-CM | POA: Insufficient documentation

## 2013-02-08 DIAGNOSIS — M545 Low back pain, unspecified: Secondary | ICD-10-CM | POA: Insufficient documentation

## 2013-02-08 DIAGNOSIS — F319 Bipolar disorder, unspecified: Secondary | ICD-10-CM | POA: Insufficient documentation

## 2013-02-08 DIAGNOSIS — S7001XA Contusion of right hip, initial encounter: Secondary | ICD-10-CM

## 2013-02-08 DIAGNOSIS — R51 Headache: Secondary | ICD-10-CM | POA: Insufficient documentation

## 2013-02-08 DIAGNOSIS — S1093XA Contusion of unspecified part of neck, initial encounter: Principal | ICD-10-CM

## 2013-02-08 DIAGNOSIS — M62838 Other muscle spasm: Secondary | ICD-10-CM | POA: Insufficient documentation

## 2013-02-08 DIAGNOSIS — F17200 Nicotine dependence, unspecified, uncomplicated: Secondary | ICD-10-CM | POA: Insufficient documentation

## 2013-02-08 DIAGNOSIS — Y9301 Activity, walking, marching and hiking: Secondary | ICD-10-CM | POA: Insufficient documentation

## 2013-02-08 DIAGNOSIS — S63502A Unspecified sprain of left wrist, initial encounter: Secondary | ICD-10-CM

## 2013-02-08 DIAGNOSIS — Z79899 Other long term (current) drug therapy: Secondary | ICD-10-CM | POA: Insufficient documentation

## 2013-02-08 DIAGNOSIS — S7000XA Contusion of unspecified hip, initial encounter: Secondary | ICD-10-CM | POA: Insufficient documentation

## 2013-02-08 DIAGNOSIS — W19XXXA Unspecified fall, initial encounter: Secondary | ICD-10-CM

## 2013-02-08 DIAGNOSIS — S0093XA Contusion of unspecified part of head, initial encounter: Secondary | ICD-10-CM

## 2013-02-08 DIAGNOSIS — S0083XA Contusion of other part of head, initial encounter: Principal | ICD-10-CM

## 2013-02-08 DIAGNOSIS — Z8719 Personal history of other diseases of the digestive system: Secondary | ICD-10-CM | POA: Insufficient documentation

## 2013-02-08 DIAGNOSIS — M79609 Pain in unspecified limb: Secondary | ICD-10-CM | POA: Insufficient documentation

## 2013-02-08 DIAGNOSIS — F489 Nonpsychotic mental disorder, unspecified: Secondary | ICD-10-CM | POA: Insufficient documentation

## 2013-02-08 DIAGNOSIS — Y929 Unspecified place or not applicable: Secondary | ICD-10-CM | POA: Insufficient documentation

## 2013-02-08 LAB — CBC WITH DIFFERENTIAL/PLATELET
BASOS ABS: 0.1 10*3/uL (ref 0.0–0.1)
Basophils Relative: 1 % (ref 0–1)
EOS ABS: 0.5 10*3/uL (ref 0.0–0.7)
EOS PCT: 5 % (ref 0–5)
HEMATOCRIT: 37 % (ref 36.0–46.0)
Hemoglobin: 12.5 g/dL (ref 12.0–15.0)
LYMPHS ABS: 2.8 10*3/uL (ref 0.7–4.0)
LYMPHS PCT: 28 % (ref 12–46)
MCH: 28.7 pg (ref 26.0–34.0)
MCHC: 33.8 g/dL (ref 30.0–36.0)
MCV: 85.1 fL (ref 78.0–100.0)
MONO ABS: 0.6 10*3/uL (ref 0.1–1.0)
Monocytes Relative: 6 % (ref 3–12)
Neutro Abs: 6.2 10*3/uL (ref 1.7–7.7)
Neutrophils Relative %: 61 % (ref 43–77)
Platelets: 402 10*3/uL — ABNORMAL HIGH (ref 150–400)
RBC: 4.35 MIL/uL (ref 3.87–5.11)
RDW: 13.7 % (ref 11.5–15.5)
WBC: 10.1 10*3/uL (ref 4.0–10.5)

## 2013-02-08 LAB — BASIC METABOLIC PANEL
BUN: 17 mg/dL (ref 6–23)
CALCIUM: 9.6 mg/dL (ref 8.4–10.5)
CO2: 24 meq/L (ref 19–32)
CREATININE: 0.65 mg/dL (ref 0.50–1.10)
Chloride: 103 mEq/L (ref 96–112)
GFR calc Af Amer: >90 mL/min (ref >90)
GFR calc non Af Amer: >90 mL/min (ref >90)
GLUCOSE: 97 mg/dL (ref 70–99)
Potassium: 4.4 mEq/L (ref 3.7–5.3)
Sodium: 139 mEq/L (ref 137–147)

## 2013-02-08 MED ORDER — CYCLOBENZAPRINE HCL 10 MG PO TABS: 5.0000 mg | ORAL_TABLET | Freq: Two times a day (BID) | ORAL | Status: DC | PRN

## 2013-02-08 MED ORDER — TRAMADOL HCL 50 MG PO TABS: 50.0000 mg | ORAL_TABLET | Freq: Four times a day (QID) | ORAL | Status: DC | PRN

## 2013-02-08 MED ORDER — OXYCODONE-ACETAMINOPHEN 5-325 MG PO TABS
2.0000 | ORAL_TABLET | Freq: Once | ORAL | Status: AC
  Administered 2013-02-08: 2 via ORAL
  Filled 2013-02-08: qty 2

## 2013-02-08 NOTE — Discharge Instructions (Signed)
Facial or Scalp Contusion A facial or scalp contusion is a deep bruise on the face or head. Injuries to the face and head generally cause a lot of swelling, especially around the eyes. Contusions are the result of an injury that caused bleeding under the skin. The contusion may turn blue, purple, or yellow. Minor injuries will give you a painless contusion, but more severe contusions may stay painful and swollen for a few weeks.  CAUSES  A facial or scalp contusion is caused by a blunt injury or trauma to the face or head area.  SIGNS AND SYMPTOMS   Swelling of the injured area.   Discoloration of the injured area.   Tenderness, soreness, or pain in the injured area.  DIAGNOSIS  The diagnosis can be made by taking a medical history and doing a physical exam. An X-ray exam, CT scan, or MRI may be needed to determine if there are any associated injuries, such as broken bones (fractures). TREATMENT  Often, the best treatment for a facial or scalp contusion is applying cold compresses to the injured area. Over-the-counter medicines may also be recommended for pain control.  HOME CARE INSTRUCTIONS   Only take over-the-counter or prescription medicines as directed by your health care provider.   Apply ice to the injured area.   Put ice in a plastic bag.   Place a towel between your skin and the bag.   Leave the ice on for 20 minutes, 2 3 times a day.  SEEK MEDICAL CARE IF:  You have bite problems.   You have pain with chewing.   You are concerned about facial defects. SEEK IMMEDIATE MEDICAL CARE IF:  You have severe pain or a headache that is not relieved by medicine.   You have unusual sleepiness, confusion, or personality changes.   You throw up (vomit).   You have a persistent nosebleed.   You have double vision or blurred vision.   You have fluid drainage from your nose or ear.   You have difficulty walking or using your arms or legs.  MAKE SURE YOU:    Understand these instructions.  Will watch your condition.  Will get help right away if you are not doing well or get worse. Document Released: 02/15/2004 Document Revised: 10/28/2012 Document Reviewed: 08/20/2012 [facility] Patient Information 2014 [place], [place].  Sprain A sprain is a tear in one of the strong, fibrous tissues that connect your bones (ligaments). The severity of the sprain depends on how much of the ligament is torn. The tear can be either partial or complete. CAUSES  Often, sprains are a result of a fall or an injury. The force of the impact causes the fibers of your ligament to stretch beyond their normal length. This excess tension causes the fibers of your ligament to tear. SYMPTOMS  You may have some loss of motion or increased pain within your normal range of motion. Other symptoms include:  Bruising.  Tenderness.  Swelling. DIAGNOSIS  In order to diagnose a sprain, your caregiver will physically examine you to determine how torn the ligament is. Your caregiver may also suggest an X-ray exam to make sure no bones are broken. TREATMENT  If your ligament is only partially torn, treatment usually involves keeping the injured area in a fixed position (immobilization) for a short period. To do this, your caregiver will apply a bandage, cast, or splint to keep the area from moving until it heals. For a partially torn ligament, the healing process usually  takes 2 to 3 weeks. If your ligament is completely torn, you may need surgery to reconnect the ligament to the bone or to reconstruct the ligament. After surgery, a cast or splint may be applied and will need to stay on for 4 to 6 weeks while your ligament heals. HOME CARE INSTRUCTIONS  Keep the injured area elevated to decrease swelling.  To ease pain and swelling, apply ice to your joint twice a day, for 2 to 3 days.  Put ice in a plastic bag.  Place a towel between your skin and the bag.  Leave the ice on  for 15 minutes.  Only take over-the-counter or prescription medicine for pain as directed by your caregiver.  Do not leave the injured area unprotected until pain and stiffness go away (usually 3 to 4 weeks).  Do not allow your cast or splint to get wet. Cover your cast or splint with a plastic bag when you shower or bathe. Do not swim.  Your caregiver may suggest exercises for you to do during your recovery to prevent or limit permanent stiffness. SEEK IMMEDIATE MEDICAL CARE IF:  Your cast or splint becomes damaged.  Your pain becomes worse. MAKE SURE YOU:  Understand these instructions. Wrist Pain Wrist injuries are frequent in adults and children. A sprain is an injury to the ligaments that hold your bones together. A strain is an injury to muscle or muscle cord-like structures (tendons) from stretching or pulling. Generally, when wrists are moderately tender to touch following a fall or injury, a break in the bone (fracture) may be present. Most wrist sprains or strains are better in 3 to 5 days, but complete healing may take several weeks. HOME CARE INSTRUCTIONS  Put ice on the injured area. Put ice in a plastic bag. Place a towel between your skin and the bag. Leave the ice on for 15-20 minutes, 03-04 times a day, for the first 2 days. Keep your arm raised above the level of your heart whenever possible to reduce swelling and pain. Rest the injured area for at least 48 hours or as directed by your caregiver. If a splint or elastic bandage has been applied, use it for as long as directed by your caregiver or until seen by a caregiver for a follow-up exam. Only take over-the-counter or prescription medicines for pain, discomfort, or fever as directed by your caregiver. Keep all follow-up appointments. You may need to follow up with a specialist or have follow-up X-rays. Improvement in pain level is not a guarantee that you did not fracture a bone in your wrist. The only way to  determine whether or not you have a broken bone is by X-ray. SEEK IMMEDIATE MEDICAL CARE IF:  Your fingers are swollen, very red, white, or cold and blue. Your fingers are numb or tingling. You have increasing pain. You have difficulty moving your fingers. MAKE SURE YOU:  Understand these instructions. Will watch your condition. Will get help right away if you are not doing well or get worse. Document Released: 10/17/2004 Document Revised: 04/01/2011 Document Reviewed: 02/28/2010 [facility] Patient Information 2014 [place], [place].   Will watch your condition.  Will get help right away if you are not doing well or get worse. Document Released: 01/05/2000 Document Revised: 04/01/2011 Document Reviewed: 01/19/2011 [facility] Patient Information 2014 [place], [place].

## 2013-02-08 NOTE — ED Notes (Addendum)
Pt to ED via EMS for fall. Pt states she was standing on stairs when slipped and fell on her back, roll down about 11 steps. Laceration noted at lateral right wrist. Pt c/o neck, back, right hip, and right arm pain. No deformity noted and LOC. Per EMS, BP-121/78, O2-98% on room air, RR-16, pain 10/10. Pt alerts and oriented x4.

## 2013-02-08 NOTE — ED Provider Notes (Signed)
CSN: [id number]     Arrival date & time 02/08/13  1731 History   First MD Initiated Contact with Patient 02/08/13 1749     Chief Complaint  Patient presents with  . Fall   (Consider location/radiation/quality/duration/timing/severity/associated sxs/prior Treatment) HPI  Agent presents to the emergency department by EMS with cervical collar in place and on a long spineboard. Patient states that she was then slippery socks and accidentally slipped and fell down 11 stairs. she endorses toppling head over heels down the stairs unable to stop because of the momentum. She complained of headache, neck pain, right hip pain, right wrist pain. She is a small scratch to her right wrist. No obvious deformities no obvious injuries. Her vital signs are stable she is awake alert and oriented.   Past Medical History  Diagnosis Date  . No pertinent past medical history   . Gallbladder attack   . Mental disorder   . Bipolar 1 disorder    Past Surgical History  Procedure Laterality Date  . Tubes in ears    . Cholecystectomy  2012  . Adenoidectomy      as a child   Family History  Problem Relation Age of Onset  . Diabetes Father   . Hypertension Father   . Diabetes Maternal Aunt   . Hypertension Maternal Aunt   . Hypertension Maternal Uncle   . Hypertension Maternal Grandmother   . Asthma Paternal Grandmother    History  Substance Use Topics  . Smoking status: Current Some Day Smoker -- 0.30 packs/day    Types: Cigarettes  . Smokeless tobacco: Never Used  . Alcohol Use: No   OB History   Grav Para Term Preterm Abortions TAB SAB Ect Mult Living   2 2 2       2      Review of Systems The patient denies anorexia, fever, weight loss,, vision loss, decreased hearing, hoarseness, chest pain, syncope, dyspnea on exertion, peripheral edema, balance deficits, hemoptysis, abdominal pain, melena, hematochezia, severe indigestion/heartburn, hematuria, incontinence, genital sores, muscle weakness,  suspicious skin lesions, transient blindness, difficulty walking, depression, unusual weight change, abnormal bleeding, enlarged lymph nodes, angioedema, and breast masses.  Allergies  Shrimp  Home Medications   Current Outpatient Rx  Name  Route  Sig  Dispense  Refill  . acetaminophen (TYLENOL) 500 MG tablet   Oral   Take 500 mg by mouth every 6 (six) hours as needed for pain.         Marland Kitchen ondansetron (ZOFRAN ODT) 4 MG disintegrating tablet   Oral   Take 1 tablet (4 mg total) by mouth every 8 (eight) hours as needed for nausea.   30 tablet   1   . Prenatal Vit-Fe Fumarate-FA (PRENATAL COMPLETE) 14-0.4 MG TABS   Oral   Take 1 tablet by mouth once.   60 each   0   . ranitidine (ZANTAC) 150 MG tablet   Oral   Take 1 tablet (150 mg total) by mouth at bedtime.   60 tablet   1   . cyclobenzaprine (FLEXERIL) 10 MG tablet   Oral   Take 0.5 tablets (5 mg total) by mouth 2 (two) times daily as needed for muscle spasms.   20 tablet   0   . traMADol (ULTRAM) 50 MG tablet   Oral   Take 1 tablet (50 mg total) by mouth every 6 (six) hours as needed.   15 tablet   0    BP 141/81  Pulse  78  Temp(Src) 98.6 F (37 C) (Oral)  Resp 16  SpO2 99%  LMP 01/21/2013 Physical Exam  Constitutional: She is oriented to person, place, and time. She appears well-developed and well-nourished. No distress.  HENT:  Head: Normocephalic and atraumatic. Head is without raccoon's eyes, without Battle's sign, without abrasion, without contusion, without laceration, without right periorbital erythema and without left periorbital erythema.  Nose: Nose normal.  Mouth/Throat: Uvula is midline, oropharynx is clear and moist and mucous membranes are normal. Dental caries: Pts tooth shows no obvious abscess but moderate to severe tenderness to palpation of marked tooth.  Eyes: Pupils are equal, round, and reactive to light.  Neck: Trachea normal, normal range of motion and full passive range of motion  without pain. Neck supple.  Cardiovascular: Normal rate, regular rhythm, normal heart sounds and normal pulses.   Pulmonary/Chest: Effort normal and breath sounds normal. Chest wall is not dull to percussion. She exhibits tenderness. She exhibits no crepitus, no edema, no deformity and no retraction.  Abdominal: Normal appearance. There is no tenderness.  Musculoskeletal:       Right wrist: She exhibits decreased range of motion, tenderness, bony tenderness and swelling. She exhibits no crepitus and no deformity.       Right hip: She exhibits tenderness and bony tenderness. She exhibits normal range of motion, normal strength, no swelling and no crepitus.       Lumbar back: She exhibits tenderness, pain and spasm. She exhibits normal range of motion, no bony tenderness and no edema.       Back:  Neurological: She is alert and oriented to person, place, and time. She has normal strength. No cranial nerve deficit or sensory deficit.  Skin: Skin is warm, dry and intact. She is not diaphoretic.  Psychiatric: She has a normal mood and affect. Her speech is normal. Cognition and memory are normal.    ED Course  Procedures (including critical care time) Labs Review Labs Reviewed  CBC WITH DIFFERENTIAL - Abnormal; Notable for the following:    Platelets 402 (*)    All other components within normal limits  BASIC METABOLIC PANEL   Imaging Review Dg Wrist Complete Right  02/08/2013   CLINICAL DATA:  Fall  EXAM: RIGHT WRIST - COMPLETE 3+ VIEW  COMPARISON:  None.  FINDINGS: There is no evidence of fracture or dislocation. There is no evidence of arthropathy or other focal bone abnormality. Soft tissues are unremarkable.  IMPRESSION: Negative.   Electronically Signed   By: Rise MuBenjamin  McClintock M.D.   On: 02/08/2013 19:59   Dg Hip Complete Right  02/08/2013   CLINICAL DATA:  Fall  EXAM: RIGHT HIP - COMPLETE 2+ VIEW  COMPARISON:  None available  FINDINGS: There is no evidence of acute fracture or  dislocation about the right hip. No pubic diastasis. SI joints are approximated. Limited views the left upper unremarkable. No soft tissue abnormality.  IMPRESSION: No acute fracture or dislocation.   Electronically Signed   By: Rise MuBenjamin  McClintock M.D.   On: 02/08/2013 20:01    EKG Interpretation   None       MDM   1. Fall   2. Head contusion   3. Contusion of hip, right   4. Left wrist sprain      Pt tried to void for 1 hour and was unable to. She had a baby 4 months ago and says that she has not had sex since then. She Understands the risks of going to get  a CT scan or xrays if she were to be pregnant. She declines in and out cath and says that she would like to go ahead and go to CT because she i knows she is not pregnant.   Percocet given for pain and ice applied.  End of shift, care handed off to Sabino Dick, NP.  Waiting for images to result. Will discharge accordingly.    Dorthula Matas, PA-C 02/08/13 2007

## 2013-02-08 NOTE — ED Notes (Signed)
Pt states she is not pregnant because she had a baby 4 months ago and had not has sex since. Urine pregnancy test canceled that was ordered to check for pregnancy prior to CT and X-ray scan.

## 2013-02-08 NOTE — ED Provider Notes (Signed)
Medical screening examination/treatment/procedure(s) were performed by non-physician practitioner and as supervising physician I was immediately available for consultation/collaboration.     Geoffery Lyonsouglas Meliya Mcconahy, MD 02/08/13 2255

## 2013-03-18 ENCOUNTER — Emergency Department: Payer: Self-pay | Admitting: Internal Medicine

## 2013-03-18 LAB — CBC
HCT: 37.4 % (ref 35.0–47.0)
HGB: 12.8 g/dL (ref 12.0–16.0)
MCH: 29.4 pg (ref 26.0–34.0)
MCHC: 34.4 g/dL (ref 32.0–36.0)
MCV: 86 fL (ref 80–100)
PLATELETS: 370 10*3/uL (ref 150–440)
RBC: 4.36 10*6/uL (ref 3.80–5.20)
RDW: 14.7 % — ABNORMAL HIGH (ref 11.5–14.5)
WBC: 9.9 10*3/uL (ref 3.6–11.0)

## 2013-03-18 LAB — BASIC METABOLIC PANEL
ANION GAP: 7 (ref 7–16)
BUN: 16 mg/dL (ref 7–18)
CHLORIDE: 107 mmol/L (ref 98–107)
CREATININE: 0.72 mg/dL (ref 0.60–1.30)
Calcium, Total: 9.1 mg/dL (ref 8.5–10.1)
Co2: 23 mmol/L (ref 21–32)
EGFR (African American): >60
Glucose: 103 mg/dL — ABNORMAL HIGH (ref 65–99)
Osmolality: 275 (ref 275–301)
POTASSIUM: 4.1 mmol/L (ref 3.5–5.1)
Sodium: 137 mmol/L (ref 136–145)

## 2013-03-18 LAB — URINALYSIS, COMPLETE
Bacteria: NONE SEEN
Bilirubin,UR: NEGATIVE
Blood: NEGATIVE
Glucose,UR: NEGATIVE mg/dL (ref 0–75)
Ketone: NEGATIVE
Leukocyte Esterase: NEGATIVE
Nitrite: NEGATIVE
Ph: 5 (ref 4.5–8.0)
Protein: NEGATIVE
RBC,UR: 1 /HPF (ref 0–5)
SPECIFIC GRAVITY: 1.025 (ref 1.003–1.030)
Squamous Epithelial: 2
WBC UR: 3 /HPF (ref 0–5)

## 2013-11-22 ENCOUNTER — Encounter (HOSPITAL_COMMUNITY): Payer: Self-pay | Admitting: Emergency Medicine

## 2014-05-31 NOTE — H&P (Signed)
L&D Evaluation:  History:  HPI Pt arrived via EMS with "pelvic pressure and a feeling like she may pass out after standing on her feet cooking and then ttaking a bath and feeling poorly". [facility] (she does not know the names of the providers there) but, has been told she had a 3 h GCT and is to get the results in the am. No ROM, VB or decreased FM. Pt is not in labor and was just worried about herself and baby. No prenatal records available. Hx per pt. G2P1001 with EDd of 09/27/12.   Presents with back pain   Patient's Medical History No Chronic Illness   Patient's Surgical History none   Medications Pre Natal Vitamins   Allergies NKDA   Social History tobacco  1-2 cigs a day now but, formerly 1 pppd x 6 yrs   Family History Non-Contributory   ROS:  ROS All systems were reviewed.  HEENT, CNS, GI, GU, Respiratory, CV, Renal and Musculoskeletal systems were found to be normal.   Exam:  Vital Signs stable  BP 139/70's   General no apparent distress   Mental Status clear   Chest clear   Heart normal sinus rhythm, no murmur/gallop/rubs   Abdomen gravid, non-tender   Estimated Fetal Weight Average for gestational age   Fetal Position vtx   Back no CVAT   Edema 1+   Reflexes 1+   Pelvic closed/non-effaced   Mebranes Intact   FHT normal rate with no decels, +accels, no decels   Ucx irregular   Skin dry   Lymph no lymphadenopathy   Impression:  Impression IUP at 36 weeks   Plan:  Plan UA, Pt is stable. Will do NST and get urine   Electronic Signatures: Sharee PimpleJones, Caron W (CNM)  (Signed 05-Aug-14 23:29)  Authored: L&D Evaluation   Last Updated: 05-Aug-14 23:29 by Sharee PimpleJones, Caron W (CNM)

## 2014-05-31 NOTE — H&P (Signed)
L&D Evaluation:  History:  HPI Pt arrived via EMS tonight after having a "pop" in her abd and then 2 UC's when she called the EMS to bring her to [place]. [facility] there and they advised her to come get checked. No complete records here. No ROM, VB or decreased FM. 5 family members arrived after pt came via EMS. Pt is having a few UC's and agreed to be given Terb to stop the UC's.LMP of 12/05/11  with EDD of 09/10/12 & US EDD at 10 wks of  09/27/12 with late entry to care at 31 weeks.   Presents with contractions   Patient's Medical History Bipolar I disorder, Obesity   Patient's Surgical History Colecystectomy  Tubes in ears   Allergies NKDA   Social History tobacco  1-2 cigs a day formerly 1 ppd x 6 yrs.   Family History Dad:DM, HTN, Mat Aunt:DM, Mat Aunt:CHTN, Mat Uncle: CHTN, Mat GM:HTN, Pat NF:AOZHYQGM:Asthma   ROS:  ROS All systems were reviewed.  HEENT, CNS, GI, GU, Respiratory, CV, Renal and Musculoskeletal systems were found to be normal.   Exam:  Vital Signs stable   General no apparent distress   Mental Status clear   Chest clear   Heart normal sinus rhythm, no murmur/gallop/rubs   Abdomen gravid, non-tender, non-palp   Estimated Fetal Weight Average for gestational age   Back no CVAT   Pelvic cervix closed and thick   Mebranes Intact   FHT normal rate with no decels, Cat I   Ucx regular, q 3 mins, non-palp   Skin dry   Lymph no lymphadenopathy    Impression:  Impression IUP at 36 5/7 weeks with B-H's   Plan:  Plan Terb and once UC's are stopped will dc home   Comments Hydrate and treat for UTI due to 2+ leuk esterase and +suprapubic tenderness. No CVAT bilat. [facility] and to call on Monday for NOB transfer.[place] [facility] and desires to have her baby here.   Electronic Signatures: Sharee PimpleJones, Caron W (CNM)  (Signed 15-Aug-14 23:54)  Authored: L&D Evaluation   Last Updated: 15-Aug-14 23:54 by  Sharee PimpleJones, Caron W (CNM)

## 2014-09-17 IMAGING — US US OB COMP LESS 14 WK
1 series · 14 of 25 positions shown · non-contrast
Comparison: None.

CLINICAL DATA: Pelvic pain, pregnant

OBSTETRIC <14 WK ULTRASOUND
TECHNIQUE: Transabdominal ultrasound was performed for evaluation
of the gestation as well as the maternal uterus and adnexal
regions.

[Series 1: us ob comp less 14 wks · 14 of 25 slices shown]
[im 1/25]
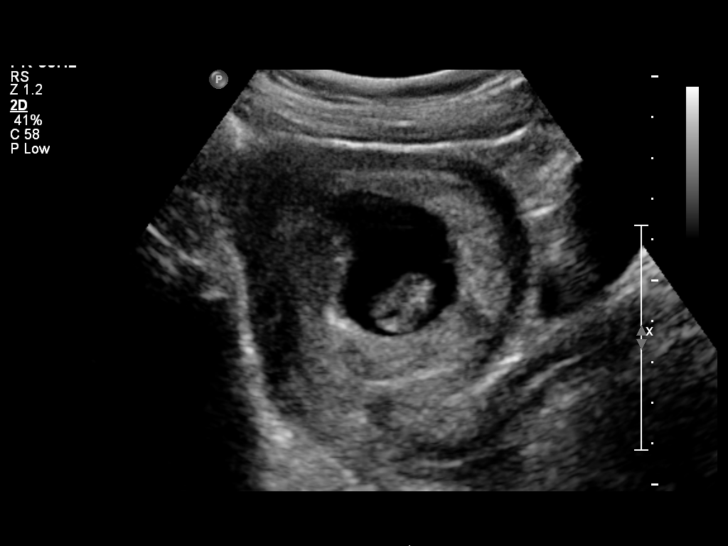
[im 3/25]
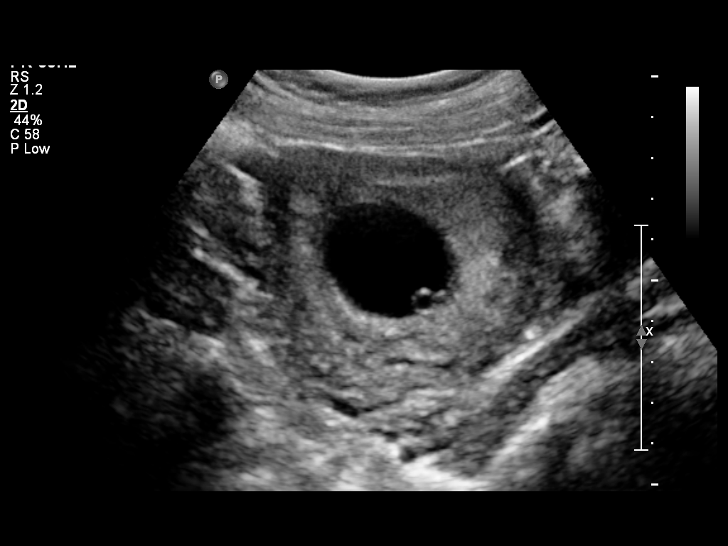
[im 5/25]
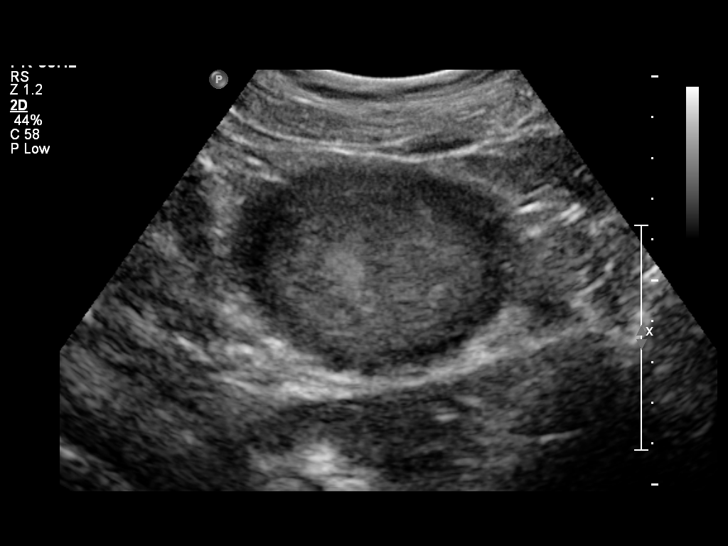
[im 7/25]
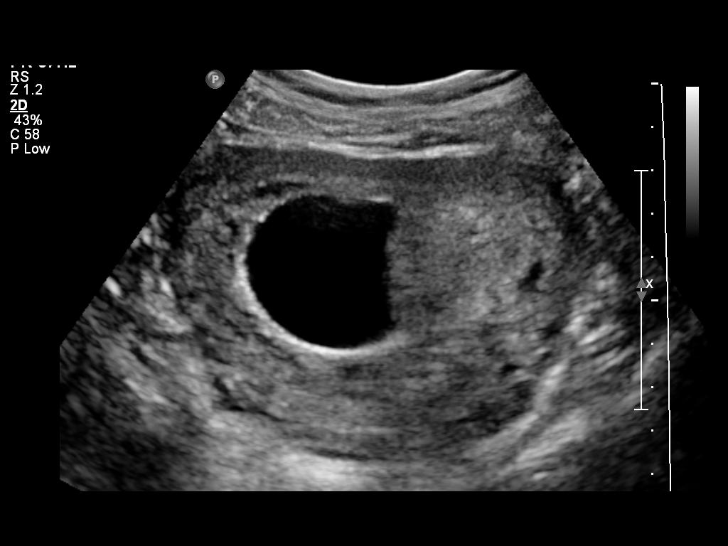
[im 9/25]
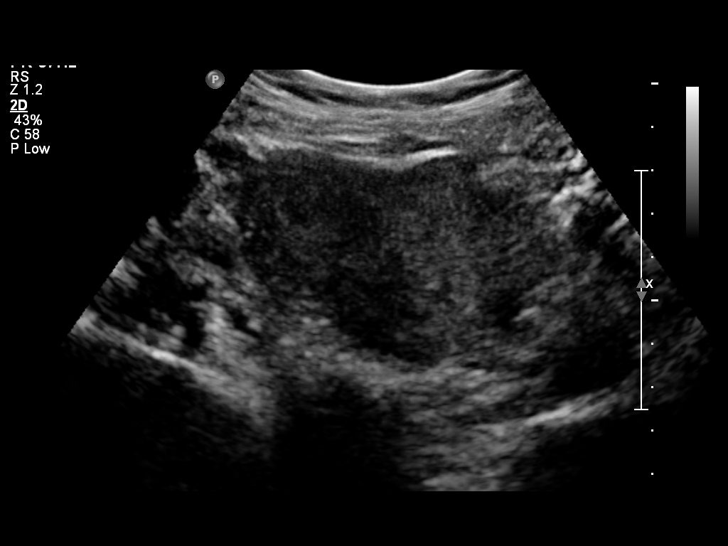
[im 10/25]
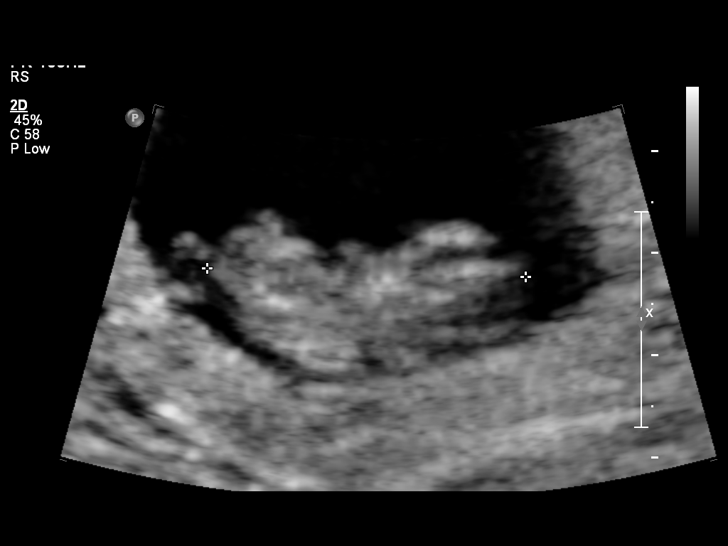
[im 12/25]
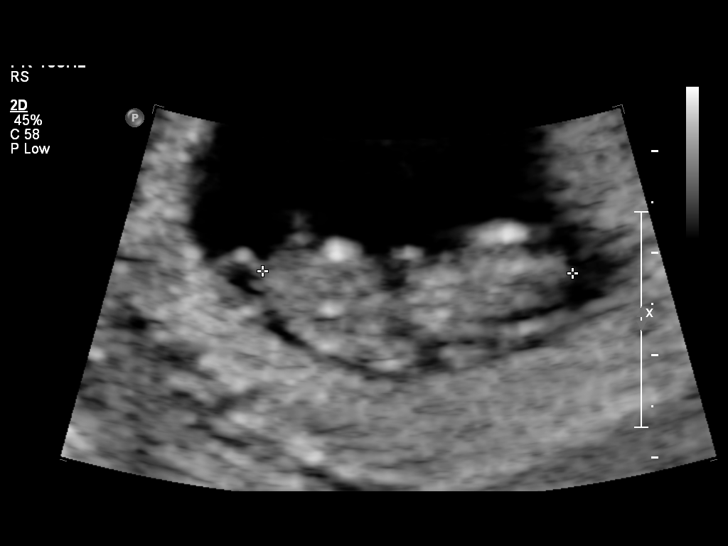
[im 14/25]
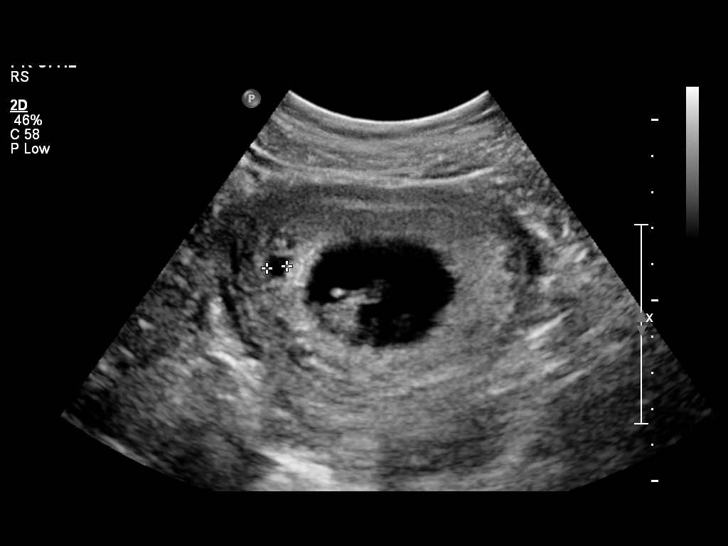
[im 16/25]
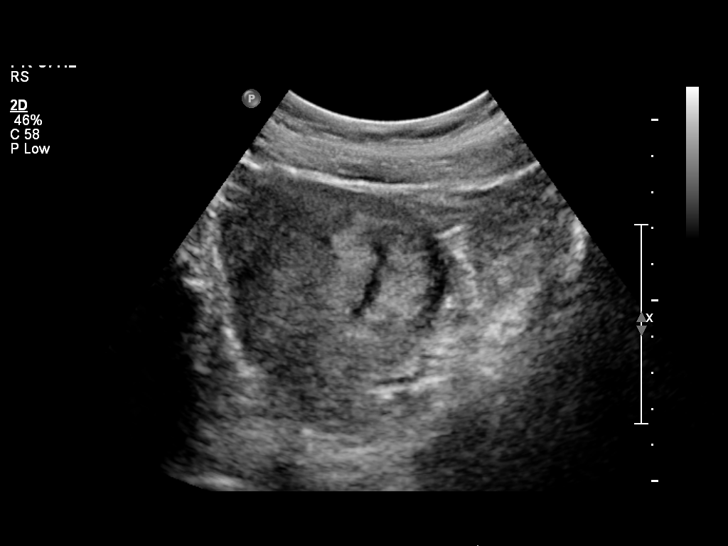
[im 17/25]
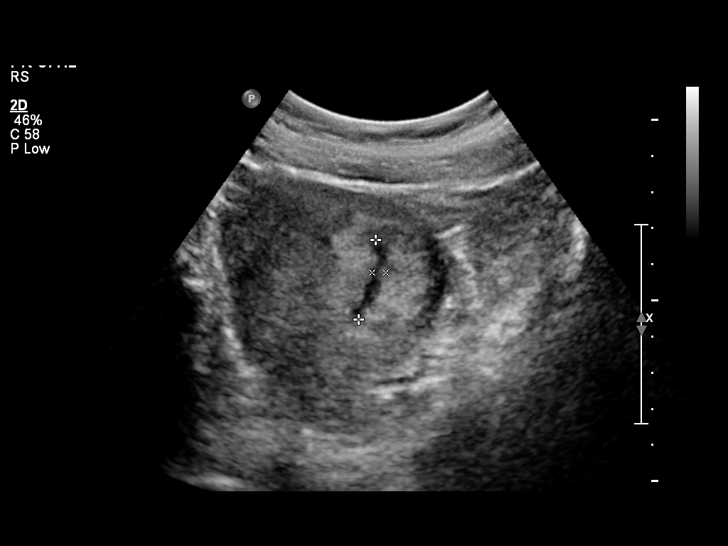
[im 19/25]
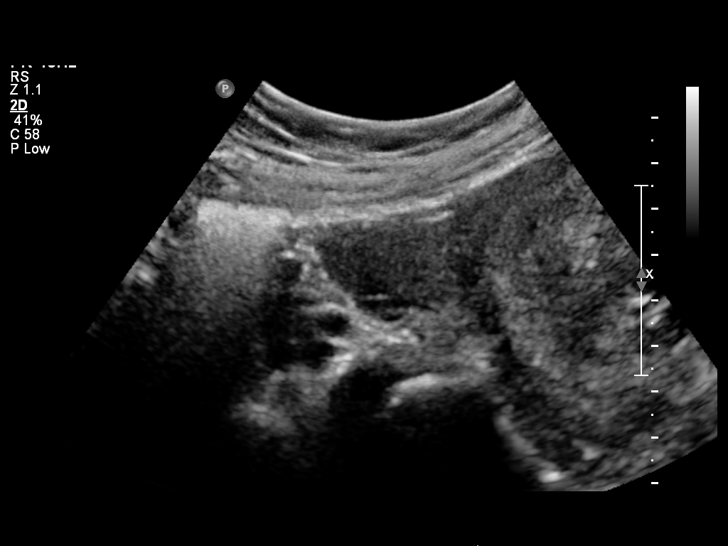
[im 21/25]
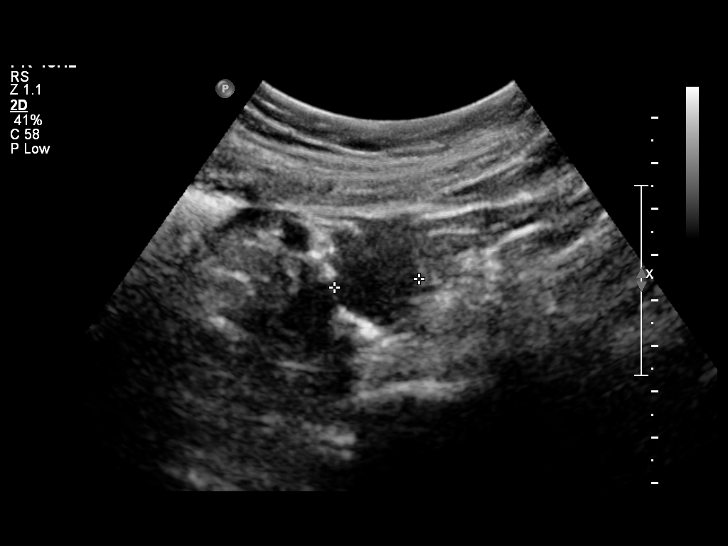
[im 23/25]
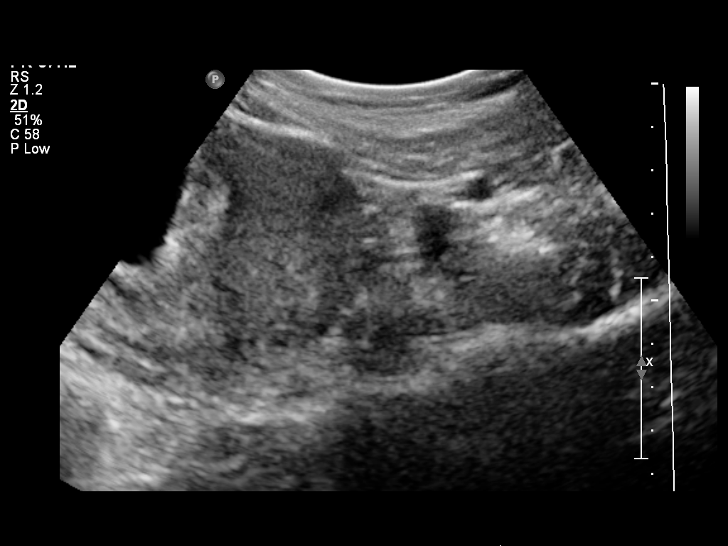
[im 25/25]
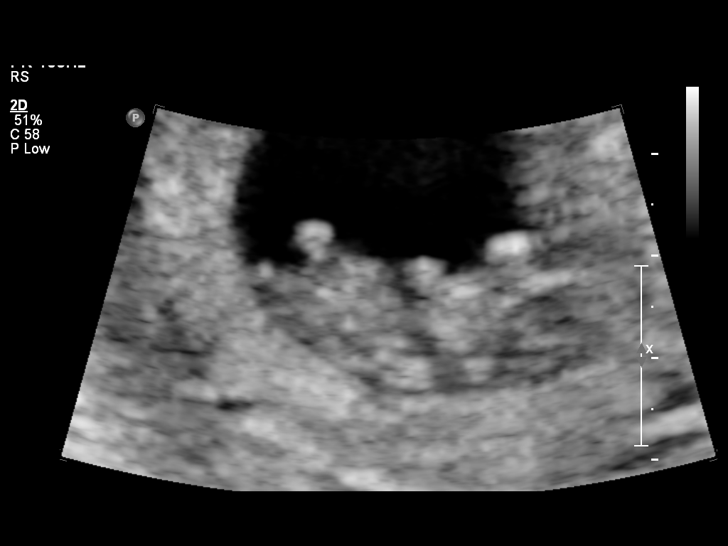

[14 of 25 positions shown; findings below may reference images not displayed]

Intrauterine gestational sac: Visualized/normal in shape.
Yolk sac: Present
Embryo: Present
Cardiac Activity: Present,
Heart Rate: 174 bpm

CRL:  31.0 mm  10 w  0 d            US EDC: 09/26/2012

Maternal uterus/Adnexae:
Small subchronic hemorrhage.

Right ovary is within normal limits, measuring 2.5 x 1.9 x 0.9 cm.

Left ovary is not the visualized.

No free fluid.
IMPRESSION: Single live intrauterine gestation with estimated gestational age
10 weeks 0 days by crown-rump length.

## 2015-01-18 IMAGING — US US OB US >=[ID] SNGL FETUS
1 series · 13 of 28 positions shown · non-contrast
Comparison: none

REASON FOR EXAM: No Prenatal Care
COMMENTS:

[Series 1: us ob us >=(id) sngl fetus · 0.28mm/px · 13 of 103 slices shown]
[im 4/103]
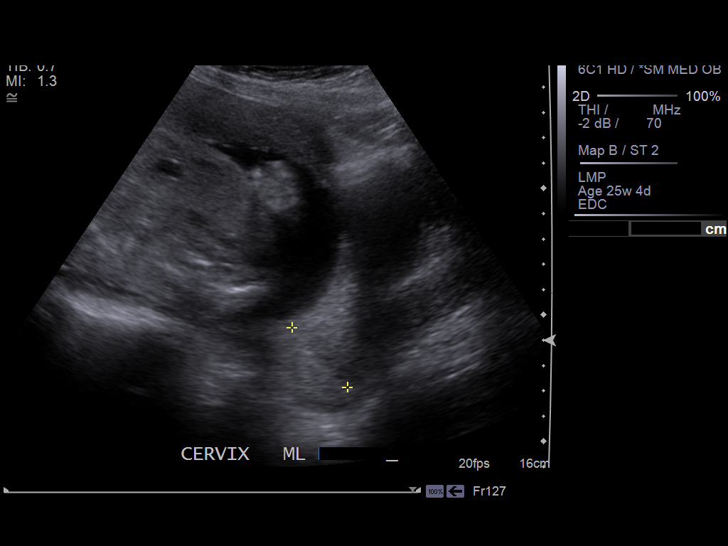
[im 12/103]
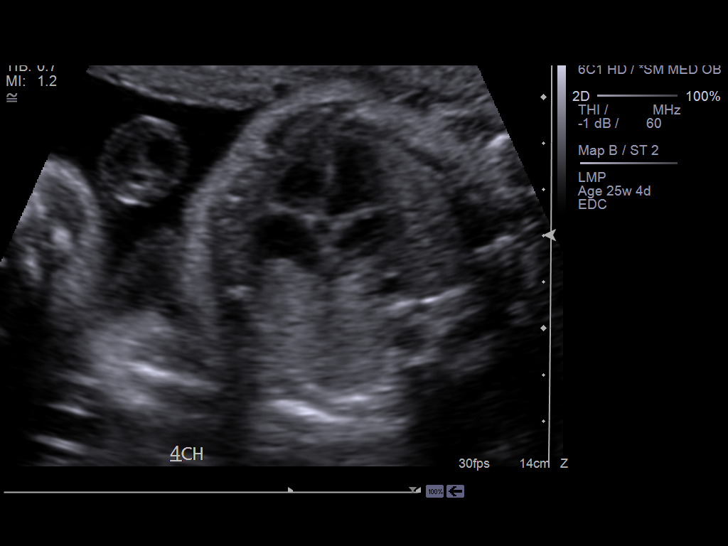
[im 19/103]
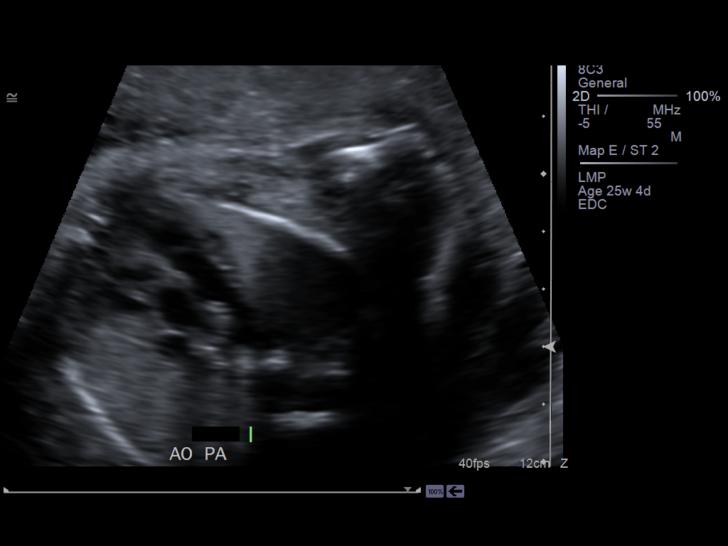
[im 27/103]
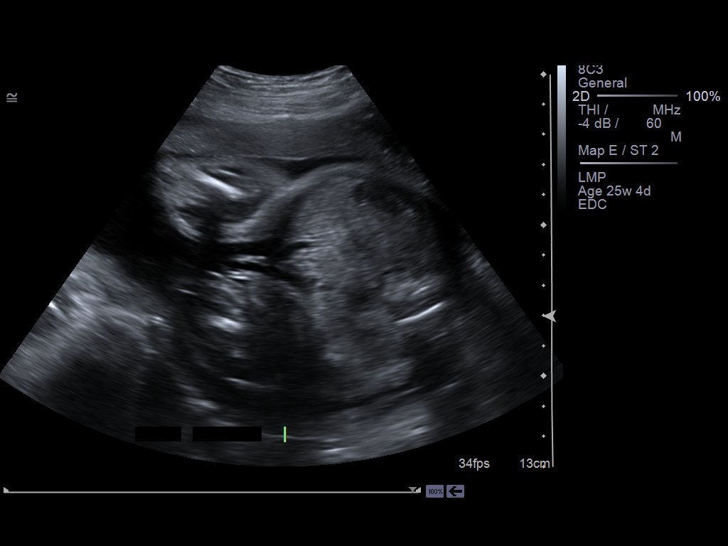
[im 35/103]
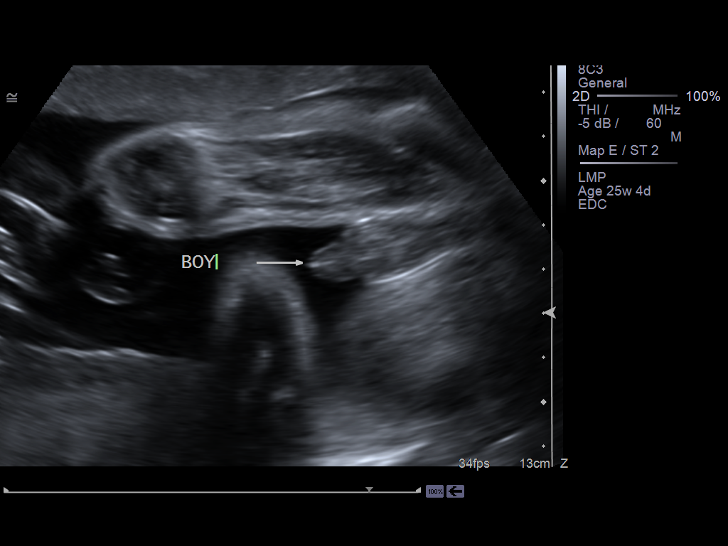
[im 42/103]
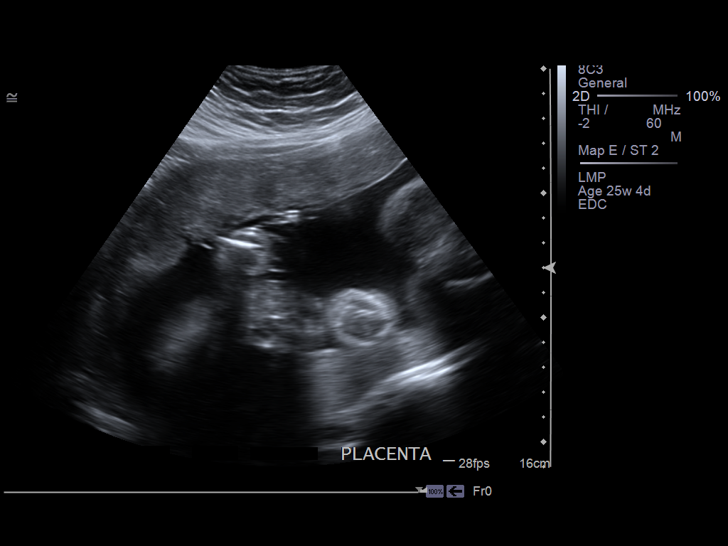
[im 53/103]
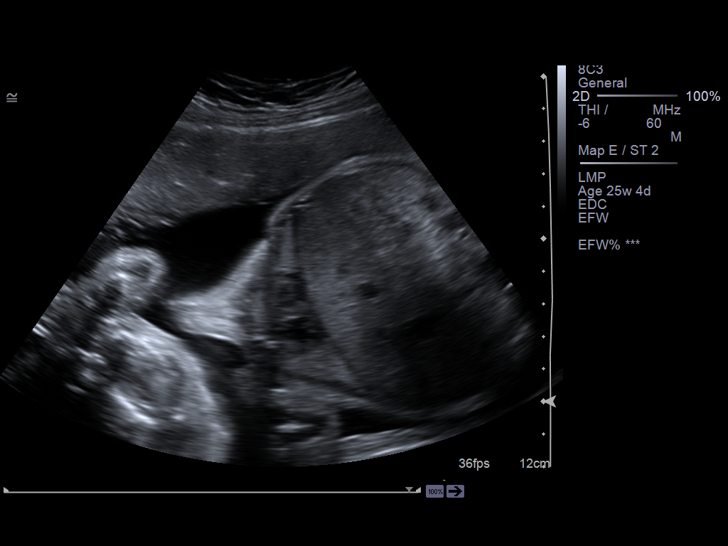
[im 61/103]
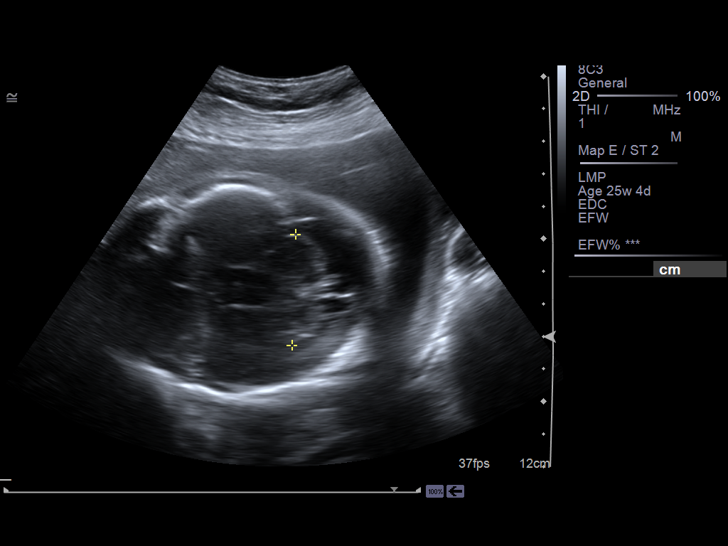
[im 69/103]
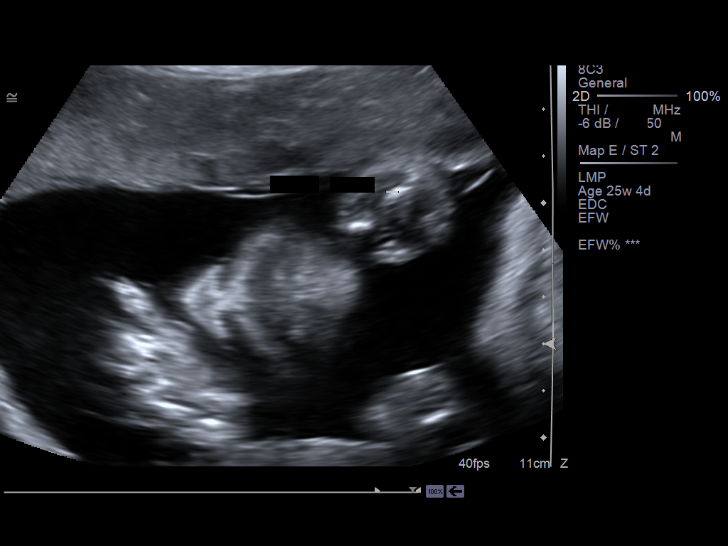
[im 76/103]
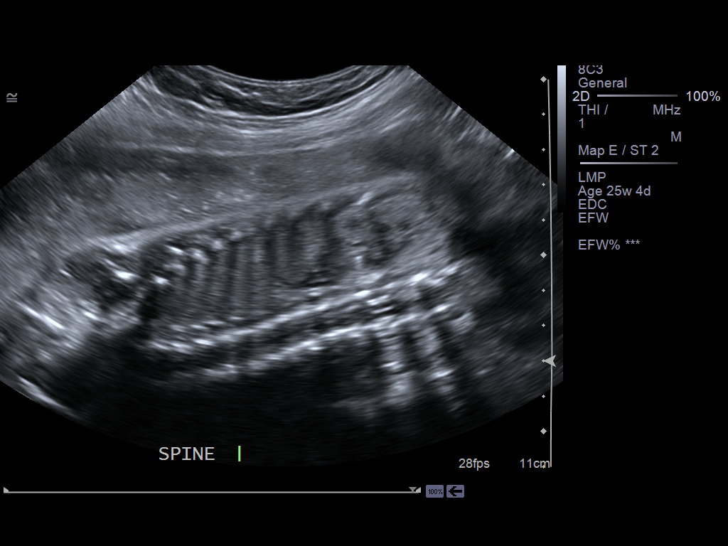
[im 84/103]
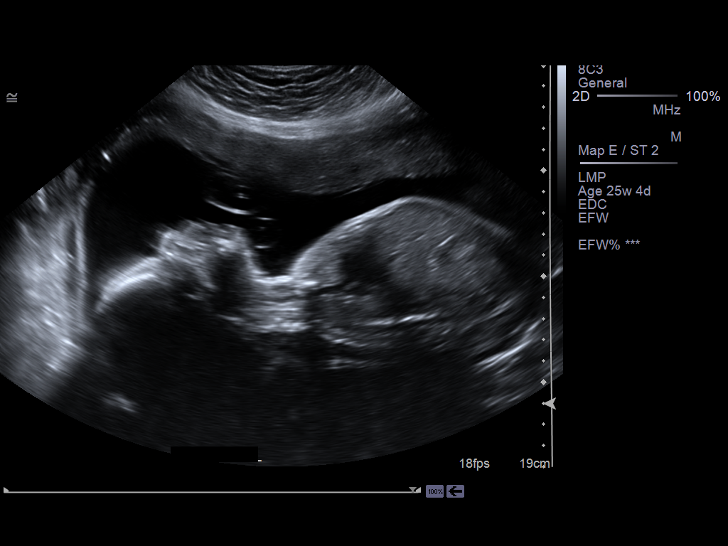
[im 91/103]
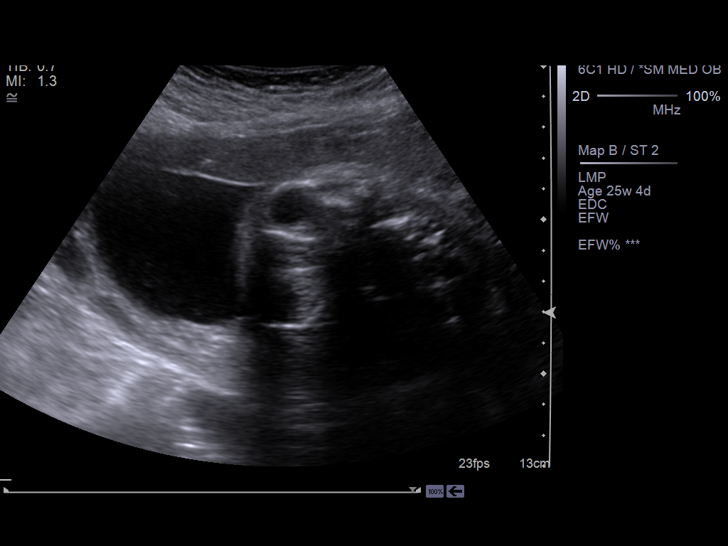
[im 99/103]
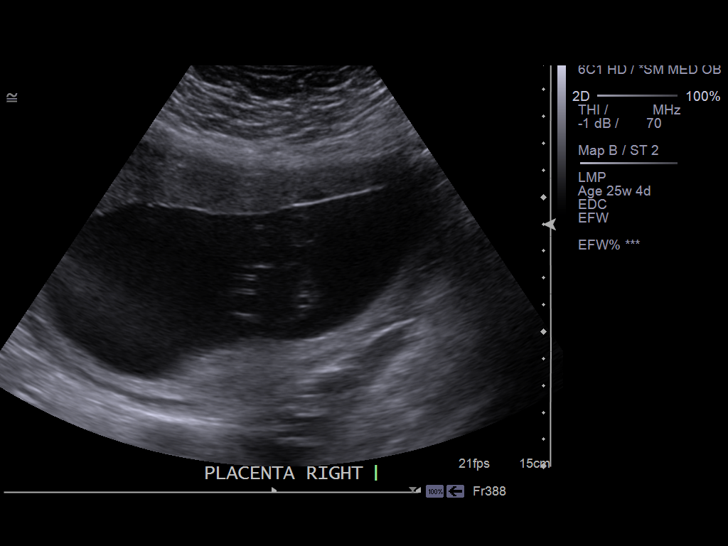

[13 of 28 positions shown; findings below may reference images not displayed]

PROCEDURE:     US  - US OB GREATER/OR EQUAL TO E3GAS  - July 01, 2012  [DATE]

RESULT:     There is a single viable IUP with breech presentation. The
amniotic fluid volume is estimated to be normal measuring 15.4 cm. The
placenta is anterior and partially fundal. There is no evidence of placenta
previa or placental abruption. The fetal stomach, kidneys, and urinary
bladder were demonstrated reasonably well. The intracranial structures and
spinal structures are grossly normal but the fetal lie a limited evaluation
of the structures. A fetal cardiac rate of 149 beats per minute was
demonstrated.

Measured parameters:

HC 26.24 cm corresponding to an EGA of 20 weeks 4 days
AC 25.4 cm corresponding to an EGA of 30 weeks 0 days
FL 5.26 cm corresponding to an EGA of 28 weeks 0 days
HL 4.82 cm corresponding to an EGA of 28 weeks 2 days
The estimated fetal weight is 1917 grams + / - 194 grams.
IMPRESSION: 1. There is a viable IUP with estimated gestational age of 28 weeks 2 days
plus or minus approximately 20 days. The estimated date of confinement is
September 21, 2012.
2. No fetal anomalies are identified.
3. The estimated fetal weight is at the  90th percentile.

[REDACTED]

## 2015-04-25 ENCOUNTER — Telehealth

## 2015-04-25 NOTE — Telephone Encounter (Signed)
Ordered. Notify patient

## 2015-04-25 NOTE — Telephone Encounter (Signed)
Patient has made an appointment for establishingwith Dr. Jeanelle MallingSparnall for 08/21/15.  She would like to ask for an order for blood work to be done before this.  She was telling me that she has not felt well for a long time...very lethargic, and lots of body soreness all the time.  Also has a constant sore throat.... Any suggestions?  Should she come in for a sick visit?  Please advise - call back at [phone]

## 2015-08-21 ENCOUNTER — Encounter: Attending: Internal Medicine

## 2015-08-28 ENCOUNTER — Ambulatory Visit: Admit: 2015-08-28 | Discharge: 2015-08-28 | Payer: PRIVATE HEALTH INSURANCE | Attending: Internal Medicine

## 2015-08-28 DIAGNOSIS — R5383 Other fatigue: Secondary | ICD-10-CM

## 2015-08-28 NOTE — Progress Notes (Signed)
Subjective:      Patient ID: Pamela Bray is a 25 y.o. female.    HPI    Here to establish care. C/o 4 year h/o fatigue that occurs daily. States she does not wake up feeling rested no matter how much sleep she gets. Exercises regularly. Past Medical History, Medications, Social History, Family History, Health Maintenance and Labs have been reviewed with Pamela Bray.    Review of Systems   Constitutional: Positive for fatigue. Negative for fever and unexpected weight change.   Respiratory: Negative for cough, chest tightness, shortness of breath and wheezing.    Cardiovascular: Negative for chest pain, palpitations and leg swelling.   Gastrointestinal: Negative for abdominal distention, constipation, diarrhea, nausea and vomiting.   Musculoskeletal: Negative for arthralgias, back pain and myalgias.   Neurological: Negative for tremors and numbness.   Psychiatric/Behavioral: Negative for self-injury, sleep disturbance and suicidal ideas.   All other systems reviewed and are negative.      Objective:   Physical Exam   Constitutional: She is oriented to person, place, and time. She appears well-developed and well-nourished. No distress.   HENT:   Right Ear: External ear normal.   Left Ear: External ear normal.   Mouth/Throat: Oropharynx is clear and moist. No oropharyngeal exudate.   Neck: Neck supple. No thyromegaly present.   Cardiovascular: Normal rate, regular rhythm, normal heart sounds and intact distal pulses.  Exam reveals no gallop and no friction rub.    No murmur heard.  Pulmonary/Chest: Effort normal and breath sounds normal. No respiratory distress. She has no wheezes. She has no rales.   Abdominal: Soft. She exhibits no distension. There is no tenderness. There is no rebound and no guarding.   Musculoskeletal: She exhibits no edema.   Neurological: She is alert and oriented to person, place, and time.   Skin: She is not diaphoretic.   Psychiatric: She has a normal mood and affect. Her behavior is  normal. Judgment and thought content normal.   Vitals reviewed.      Assessment:      Pamela Bray was seen today for new patient and fatigue.    Diagnoses and all orders for this visit:    Fatigue, unspecified type  Chronic. Discussed good sleep hygiene  -     CBC Auto Differential; Future  -     TSH without Reflex; Future    Need for prophylactic vaccination with combined diphtheria-tetanus-pertussis (DTP) vaccine  -     Tdap vaccine greater than or equal to 7yo IM          Plan:      See above plan

## 2015-10-11 ENCOUNTER — Inpatient Hospital Stay: Attending: Internal Medicine

## 2015-10-11 DIAGNOSIS — Z0000 Encounter for general adult medical examination without abnormal findings: Secondary | ICD-10-CM

## 2015-10-11 LAB — COMPREHENSIVE METABOLIC PANEL
ALT: 19 U/L (ref 10–40)
AST: 21 U/L (ref 15–37)
Albumin/Globulin Ratio: 1.3 (ref 1.1–2.2)
Albumin: 4.2 g/dL (ref 3.4–5.0)
Alkaline Phosphatase: 46 U/L (ref 40–129)
Anion Gap: 13 (ref 3–16)
BUN: 7 mg/dL (ref 7–20)
CO2: 25 mmol/L (ref 21–32)
Calcium: 9.7 mg/dL (ref 8.3–10.6)
Chloride: 103 mmol/L (ref 99–110)
Creatinine: 0.5 mg/dL — ABNORMAL LOW (ref 0.6–1.1)
GFR African American: >60 (ref >60)
GFR Non-African American: >60 (ref >60)
Globulin: 3.3 g/dL
Glucose: 93 mg/dL (ref 70–99)
Potassium: 4.4 mmol/L (ref 3.5–5.1)
Sodium: 141 mmol/L (ref 136–145)
Total Bilirubin: 0.3 mg/dL (ref 0.0–1.0)
Total Protein: 7.5 g/dL (ref 6.4–8.2)

## 2015-10-11 LAB — CBC WITH AUTO DIFFERENTIAL
Basophils %: 0.9 %
Basophils Absolute: 0.1 10*3/uL (ref 0.0–0.2)
Eosinophils %: 4.4 %
Eosinophils Absolute: 0.3 10*3/uL (ref 0.0–0.6)
Hematocrit: 41.4 % (ref 36.0–48.0)
Hemoglobin: 13.7 g/dL (ref 12.0–16.0)
Lymphocytes %: 38.6 %
Lymphocytes Absolute: 2.5 10*3/uL (ref 1.0–5.1)
MCH: 29.5 pg (ref 26.0–34.0)
MCHC: 33.2 g/dL (ref 31.0–36.0)
MCV: 88.9 fL (ref 80.0–100.0)
MPV: 10.1 fL (ref 5.0–10.5)
Monocytes %: 5.9 %
Monocytes Absolute: 0.4 10*3/uL (ref 0.0–1.3)
Neutrophils %: 50.2 %
Neutrophils Absolute: 3.3 10*3/uL (ref 1.7–7.7)
Platelets: 208 10*3/uL (ref 135–450)
RBC: 4.65 M/uL (ref 4.00–5.20)
RDW: 12.3 % — ABNORMAL LOW (ref 12.4–15.4)
WBC: 6.5 10*3/uL (ref 4.0–11.0)

## 2015-10-11 LAB — LIPID PANEL
Cholesterol, Total: 204 mg/dL — ABNORMAL HIGH (ref 0–199)
HDL: 61 mg/dL — ABNORMAL HIGH (ref 40–60)
LDL Calculated: 117 mg/dL — ABNORMAL HIGH (ref <100)
Triglycerides: 129 mg/dL (ref 0–150)
VLDL Cholesterol Calculated: 26 mg/dL

## 2015-10-11 LAB — TSH: TSH: 1.9 u[IU]/mL (ref 0.27–4.20)

## 2015-10-16 NOTE — Telephone Encounter (Signed)
Pt returned call concerning lab results - informed they were okay.

## 2018-04-15 ENCOUNTER — Other Ambulatory Visit: Payer: Self-pay

## 2018-04-15 ENCOUNTER — Emergency Department: Payer: Medicaid Other

## 2018-04-15 ENCOUNTER — Emergency Department
Admission: EM | Admit: 2018-04-15 | Discharge: 2018-04-15 | Disposition: A | Discharge status: Home / Self Care | Payer: Medicaid Other | Attending: Emergency Medicine | Admitting: Emergency Medicine

## 2018-04-15 ENCOUNTER — Encounter: Payer: Self-pay | Admitting: Emergency Medicine

## 2018-04-15 DIAGNOSIS — N921 Excessive and frequent menstruation with irregular cycle: Secondary | ICD-10-CM | POA: Diagnosis not present

## 2018-04-15 DIAGNOSIS — F17210 Nicotine dependence, cigarettes, uncomplicated: Secondary | ICD-10-CM | POA: Insufficient documentation

## 2018-04-15 DIAGNOSIS — Z79899 Other long term (current) drug therapy: Secondary | ICD-10-CM | POA: Diagnosis not present

## 2018-04-15 DIAGNOSIS — N939 Abnormal uterine and vaginal bleeding, unspecified: Secondary | ICD-10-CM | POA: Diagnosis present

## 2018-04-15 LAB — URINALYSIS, COMPLETE (UACMP) WITH MICROSCOPIC
Bacteria, UA: NONE SEEN
Bilirubin Urine: NEGATIVE
Glucose, UA: NEGATIVE mg/dL
Ketones, ur: NEGATIVE mg/dL
Nitrite: NEGATIVE
Protein, ur: 100 mg/dL — AB
RBC / HPF: >50 RBC/hpf — ABNORMAL HIGH (ref 0–5)
Specific Gravity, Urine: 1.023 (ref 1.005–1.030)
Squamous Epithelial / HPF: NONE SEEN (ref 0–5)
pH: 6 (ref 5.0–8.0)

## 2018-04-15 LAB — CBC WITH DIFFERENTIAL/PLATELET
Abs Immature Granulocytes: 0.07 10*3/uL (ref 0.00–0.07)
BASOS ABS: 0.1 10*3/uL (ref 0.0–0.1)
Basophils Relative: 0 %
EOS PCT: 3 %
Eosinophils Absolute: 0.4 10*3/uL (ref 0.0–0.5)
HCT: 37.4 % (ref 36.0–46.0)
Hemoglobin: 12.8 g/dL (ref 12.0–15.0)
Immature Granulocytes: 1 %
Lymphocytes Relative: 27 %
Lymphs Abs: 4.1 10*3/uL — ABNORMAL HIGH (ref 0.7–4.0)
MCH: 29.8 pg (ref 26.0–34.0)
MCHC: 34.2 g/dL (ref 30.0–36.0)
MCV: 87.2 fL (ref 80.0–100.0)
Monocytes Absolute: 0.8 10*3/uL (ref 0.1–1.0)
Monocytes Relative: 5 %
NRBC: 0 % (ref 0.0–0.2)
Neutro Abs: 9.6 10*3/uL — ABNORMAL HIGH (ref 1.7–7.7)
Neutrophils Relative %: 64 %
Platelets: 449 10*3/uL — ABNORMAL HIGH (ref 150–400)
RBC: 4.29 MIL/uL (ref 3.87–5.11)
RDW: 12.2 % (ref 11.5–15.5)
WBC: 15.1 10*3/uL — ABNORMAL HIGH (ref 4.0–10.5)

## 2018-04-15 LAB — BASIC METABOLIC PANEL
Anion gap: 6 (ref 5–15)
BUN: 17 mg/dL (ref 6–20)
CO2: 25 mmol/L (ref 22–32)
Calcium: 9.1 mg/dL (ref 8.9–10.3)
Chloride: 104 mmol/L (ref 98–111)
Creatinine, Ser: 0.55 mg/dL (ref 0.44–1.00)
GFR calc Af Amer: >60 mL/min (ref >60)
GFR calc non Af Amer: >60 mL/min (ref >60)
Glucose, Bld: 130 mg/dL — ABNORMAL HIGH (ref 70–99)
Potassium: 3.7 mmol/L (ref 3.5–5.1)
Sodium: 135 mmol/L (ref 135–145)

## 2018-04-15 LAB — ABO/RH: ABO/RH(D): A POS

## 2018-04-15 LAB — POCT PREGNANCY, URINE: PREG TEST UR: NEGATIVE

## 2018-04-15 NOTE — Discharge Instructions (Signed)
As we discussed, although you are having heavy vaginal bleeding, it is not dangerous at this time.  Please follow-up with the GYN doctor indicated in this document.  Please return to the emergency department if you develop any new or worsening symptoms that concern you.  Remember that tobacco use greatly increases your risk of developing blood clots while taking birth control pills and avoid smoking or using any other tobacco products.

## 2018-04-15 NOTE — ED Triage Notes (Signed)
Patient ambulatory to triage with steady gait, without difficulty or distress noted; pt reports menstrual period first of the month, stopped 5 days then began bleeding again with clots and abd cramping; pt st "I just need to find out why I keep bleeding"; denies having a gynecologist

## 2018-04-15 NOTE — ED Provider Notes (Signed)
[facility] Emergency Department Provider Note  ____________________________________________   First MD Initiated Contact with Patient 04/15/18 [phone]     (approximate)  I have reviewed the triage vital signs and the nursing notes.   HISTORY  Chief Complaint Vaginal Bleeding    HPI Gina Hurley is a 28 y.o. female with medical and psychiatric history as listed below who presents by private vehicle for evaluation of vaginal bleeding.  She states that she had her regular menstrual period at the beginning of the month (about 3 weeks ago) and then it stopped.  It started up again after about 5 days and she had several more days of bleeding and then stopped again.  Then it started back up again over the last couple of days and she is bleeding and passing clots.  She has associated intermittent abdominal cramping that is mild to moderate.  She says that typically she is very regular and this is unusual for her to have several episodes of bleeding throughout the month.  She has an implanted birth control device in her left arm but she says she thinks it is about 27 years old and they are supposed to last 3 to 4 years.  She has no GYN.  She has a primary care doctor but has not seen it recently and has not seen anyone about this issue.  She has not checked a home pregnancy test.  She is anxious and worried about the bleeding and wants to know what is going on.  She has no prior diagnoses of PCOS, dysfunctional uterine bleeding, menometrorrhagia, fibroids, etc.  Although we are in the midst of a COVID-19 pandemic, she reports no respiratory symptoms.  Specifically she denies fever/chills, nasal congestion, sore throat, chest pain, shortness of breath, cough, nausea, vomiting.  The only abdominal pain she has is the cramping.   She does report some generalized weakness, fatigue, and she says that a couple of times when she stands up she feels lightheaded.  She has not passed out.        Past Medical History:  Diagnosis Date   Bipolar 1 disorder (HCC)    Gallbladder attack    Mental disorder    No pertinent past medical history     Patient Active Problem List   Diagnosis Date Noted   Vaginal delivery 09/30/2012   Pregnancy 09/01/2012   GERD (gastroesophageal reflux disease) 08/26/2012   Elevated glucose tolerance test 08/13/2012   Back pain 09/06/2010    Past Surgical History:  Procedure Laterality Date   ADENOIDECTOMY     as a child   CHOLECYSTECTOMY  2012   Tubes in Ears      Prior to Admission medications   Medication Sig Start Date End Date Taking? Authorizing Provider  acetaminophen (TYLENOL) 500 MG tablet Take 500 mg by mouth every 6 (six) hours as needed for pain.    [provider]  cyclobenzaprine (FLEXERIL) 10 MG tablet Take 0.5 tablets (5 mg total) by mouth 2 (two) times daily as needed for muscle spasms. 02/08/13   Marlon Pel, PA-C  ondansetron (ZOFRAN ODT) 4 MG disintegrating tablet Take 1 tablet (4 mg total) by mouth every 8 (eight) hours as needed for nausea. 09/02/12   Armando Reichert, CNM  Prenatal Vit-Fe Fumarate-FA (PRENATAL COMPLETE) 14-0.4 MG TABS Take 1 tablet by mouth once. 08/26/12   Minta Balsam, MD  ranitidine (ZANTAC) 150 MG tablet Take 1 tablet (150 mg total) by mouth at bedtime.  08/26/12 08/26/13  Minta Balsam, MD  traMADol (ULTRAM) 50 MG tablet Take 1 tablet (50 mg total) by mouth every 6 (six) hours as needed. 02/08/13   Marlon Pel, PA-C    Allergies Shrimp [shellfish allergy]  Family History  Problem Relation Age of Onset   Diabetes Father    Hypertension Father    Diabetes Maternal Aunt    Hypertension Maternal Aunt    Hypertension Maternal Uncle    Hypertension Maternal Grandmother    Asthma Paternal Grandmother     Social History Social History   Tobacco Use   Smoking status: Current Some Day Smoker    Packs/day: 0.30    Types: Cigarettes   Smokeless tobacco:  Never Used  Substance Use Topics   Alcohol use: No   Drug use: No    Review of Systems Constitutional: No fever/chills Eyes: No visual changes. ENT: No sore throat. Cardiovascular: Denies chest pain. Respiratory: Denies shortness of breath. Gastrointestinal: Lower abdominal cramping associated with vaginal bleeding as described above.  No nausea, vomiting, nor diarrhea. Genitourinary: Vaginal bleeding as described above.  No dysuria. Musculoskeletal: Negative for neck pain.  Negative for back pain. Integumentary: Negative for rash. Neurological: Negative for headaches, focal weakness or numbness.   ____________________________________________   PHYSICAL EXAM:  VITAL SIGNS: ED Triage Vitals  Enc Vitals Group     BP 04/15/18 0043 (!) 156/100     Pulse Rate 04/15/18 0043 (!) 115     Resp 04/15/18 0043 18     Temp 04/15/18 0043 98.2 F (36.8 C)     Temp Source 04/15/18 0043 Oral     SpO2 04/15/18 0043 97 %     Weight 04/15/18 0042 136.1 kg (300 lb)     Height 04/15/18 0042 1.651 m ( )     Head Circumference --      Peak Flow --      Pain Score 04/15/18 0043 5     Pain Loc --      Pain Edu? --      Excl. in GC? --     Constitutional: Alert and oriented. Well appearing and in no acute distress. Eyes: Conjunctivae are normal.  Head: Atraumatic. Nose: No congestion/rhinnorhea. Mouth/Throat: Mucous membranes are moist. Neck: No stridor.  No meningeal signs.   Cardiovascular: Normal rate, regular rhythm. Good peripheral circulation. Grossly normal heart sounds. Respiratory: Normal respiratory effort.  No retractions. Lungs CTAB. Gastrointestinal: Obese, soft and non-distended.  Diffuse mild tenderness to palpation throughout the abdomen, no focal tenderness, no rebound nor guarding, no peritonitis. GU:  Deferred based on shared decision making with the patient. Musculoskeletal: No lower extremity tenderness nor edema. No gross deformities of extremities. Neurologic:   Normal speech and language. No gross focal neurologic deficits are appreciated.  Skin:  Skin is warm, dry and intact. No rash noted. Psychiatric: Mood and affect are anxious but generally appropriate.  ____________________________________________   LABS (all labs ordered are listed, but only abnormal results are displayed)  Labs Reviewed  CBC WITH DIFFERENTIAL/PLATELET - Abnormal; Notable for the following components:      Result Value   WBC 15.1 (*)    Platelets 449 (*)    Neutro Abs 9.6 (*)    Lymphs Abs 4.1 (*)    All other components within normal limits  BASIC METABOLIC PANEL - Abnormal; Notable for the following components:   Glucose, Bld 130 (*)    All other components within normal limits  URINALYSIS, COMPLETE (UACMP) WITH  MICROSCOPIC - Abnormal; Notable for the following components:   Color, Urine RED (*)    APPearance CLOUDY (*)    Hgb urine dipstick MODERATE (*)    Protein, ur 100 (*)    Leukocytes,Ua TRACE (*)    RBC / HPF >50 (*)    All other components within normal limits  POC URINE PREG, ED  POCT PREGNANCY, URINE  ABO/RH   ____________________________________________  EKG  None - EKG not ordered by ED physician ____________________________________________  RADIOLOGY   ED MD interpretation: No acute abnormalities identified on ultrasound.  Official radiology report(s): US Transvaginal Non-ob  Result Date: 04/15/2018 CLINICAL DATA:  Heavy vaginal bleeding with cramping pain EXAM: TRANSABDOMINAL AND TRANSVAGINAL ULTRASOUND OF PELVIS DOPPLER ULTRASOUND OF OVARIES TECHNIQUE: Both transabdominal and transvaginal ultrasound examinations of the pelvis were performed. Transabdominal technique was performed for global imaging of the pelvis including uterus, ovaries, adnexal regions, and pelvic cul-de-sac. It was necessary to proceed with endovaginal exam following the transabdominal exam to visualize the uterus and ovaries. Color and duplex Doppler ultrasound was  utilized to evaluate blood flow to the ovaries. COMPARISON:  None. FINDINGS: Uterus Measurements: 8.4 x 4.4 x 5.1 cm = volume: 97 mL. No fibroids or other mass visualized. Endometrium Thickness: 7 mm.  No focal abnormality visualized. Right ovary Measurements: 2.2 x 1.2 x 1.5 cm = volume: 2 mL. Normal appearance/no adnexal mass. Left ovary The left ovary was not visualized. Pulsed Doppler evaluation of the right ovary demonstrates normal low-resistance arterial and venous waveforms. Other findings No abnormal free fluid. IMPRESSION: 1. Nonvisualization of the left ovary. 2. Otherwise normal pelvic ultrasound. 3. Normal right adnexal blood flow. Electronically Signed   By: Deatra Robinson M.D.   On: 04/15/2018 03:43   US Pelvis Complete  Result Date: 04/15/2018 CLINICAL DATA:  Heavy vaginal bleeding with cramping pain EXAM: TRANSABDOMINAL AND TRANSVAGINAL ULTRASOUND OF PELVIS DOPPLER ULTRASOUND OF OVARIES TECHNIQUE: Both transabdominal and transvaginal ultrasound examinations of the pelvis were performed. Transabdominal technique was performed for global imaging of the pelvis including uterus, ovaries, adnexal regions, and pelvic cul-de-sac. It was necessary to proceed with endovaginal exam following the transabdominal exam to visualize the uterus and ovaries. Color and duplex Doppler ultrasound was utilized to evaluate blood flow to the ovaries. COMPARISON:  None. FINDINGS: Uterus Measurements: 8.4 x 4.4 x 5.1 cm = volume: 97 mL. No fibroids or other mass visualized. Endometrium Thickness: 7 mm.  No focal abnormality visualized. Right ovary Measurements: 2.2 x 1.2 x 1.5 cm = volume: 2 mL. Normal appearance/no adnexal mass. Left ovary The left ovary was not visualized. Pulsed Doppler evaluation of the right ovary demonstrates normal low-resistance arterial and venous waveforms. Other findings No abnormal free fluid. IMPRESSION: 1. Nonvisualization of the left ovary. 2. Otherwise normal pelvic ultrasound. 3. Normal  right adnexal blood flow. Electronically Signed   By: Deatra Robinson M.D.   On: 04/15/2018 03:43    ____________________________________________   PROCEDURES   Procedure(s) performed (including Critical Care):  Procedures   ____________________________________________   INITIAL IMPRESSION / MDM / ASSESSMENT AND PLAN / ED COURSE  As part of my medical decision making, I reviewed the following data within the electronic MEDICAL RECORD NUMBER Nursing notes reviewed and incorporated, Labs reviewed , Old chart reviewed and Notes from prior ED visits         Differential diagnosis includes, but is not limited to, menometrorrhagia, hormonal imbalance, fibroids, PCOS, ectopic pregnancy, incomplete abortion, etc.  The patient is well-appearing and her  vital signs are stable except for some mild tachycardia which could be secondary to her just coming into triage and being anxious, or it could represent volume depletion or anemia.  First we will check a urine pregnancy test to verify that she is not pregnant.  I will then check basic labs including a CBC, BMP, and aVR age (on the off chance that she is pregnant).  I anticipate obtaining an ultrasound to evaluate for possible fibroids and or evidence of PCOS.  I explained to the patient that this is likely not an emergent condition and will be appropriate for follow-up with GYN and she understands and agrees with the plan.  We discussed a pelvic exam but if she is not pregnant we both agree that there is no indication for an exam tonight as we know that she is bleeding and there is unlikely to be anything discovered on a pelvic exam that would not be evaluated on ultrasound.  Clinical Course as of Apr 15 415  Wed Apr 15, 2018  0231 Lab work is reassuring.  She may have some acute blood loss but currently her hemoglobin is 12.8.  Urine pregnancy test is negative and I have ordered ultrasounds for further evaluation of her uterus and ovaries.  Metabolic  panel is within normal limits.  Urinalysis reveals blood but does not appear consistent with infection.  The patient does have a leukocytosis of 15 which is nonspecific in the setting of no other signs or symptoms and in an otherwise young healthy patient.   [CF]  0311 ABO/RH(D): A POS Performed at [facility], [address] [place], [place] [address]  [CF]  [phone] Normal pelvic ultrasound with no evidence of any acute or emergent medical condition.  I will update the patient and recommend close follow-up with GYN at the next available opportunity.  I gave my usual and customary return precautions.  US Pelvis Complete [CF]    Clinical Course User Index [CF] Loleta Rose, MD    ____________________________________________  FINAL CLINICAL IMPRESSION(S) / ED DIAGNOSES  Final diagnoses:  Menometrorrhagia     MEDICATIONS GIVEN DURING THIS VISIT:  Medications - No data to display   ED Discharge Orders    None       Note:  This document was prepared using Dragon voice recognition software and may include unintentional dictation errors.   Loleta Rose, MD 04/15/18 [phone]

## 2021-02-12 ENCOUNTER — Other Ambulatory Visit: Payer: Self-pay

## 2021-02-12 ENCOUNTER — Emergency Department (HOSPITAL_COMMUNITY)
Admission: EM | Admit: 2021-02-12 | Discharge: 2021-02-13 | Disposition: A | Discharge status: Home / Self Care | Payer: Medicaid Other | Attending: Emergency Medicine | Admitting: Emergency Medicine

## 2021-02-12 ENCOUNTER — Encounter (HOSPITAL_COMMUNITY): Payer: Self-pay | Admitting: Emergency Medicine

## 2021-02-12 DIAGNOSIS — R1011 Right upper quadrant pain: Secondary | ICD-10-CM | POA: Diagnosis present

## 2021-02-12 DIAGNOSIS — D649 Anemia, unspecified: Secondary | ICD-10-CM

## 2021-02-12 DIAGNOSIS — N838 Other noninflammatory disorders of ovary, fallopian tube and broad ligament: Secondary | ICD-10-CM

## 2021-02-12 DIAGNOSIS — N839 Noninflammatory disorder of ovary, fallopian tube and broad ligament, unspecified: Secondary | ICD-10-CM | POA: Diagnosis not present

## 2021-02-12 DIAGNOSIS — R112 Nausea with vomiting, unspecified: Secondary | ICD-10-CM | POA: Diagnosis not present

## 2021-02-12 DIAGNOSIS — D539 Nutritional anemia, unspecified: Secondary | ICD-10-CM | POA: Diagnosis not present

## 2021-02-12 NOTE — ED Triage Notes (Signed)
Pt c/o upper right abd pain x 4 days. Pt was seen for the same yesterday at [facility].

## 2021-02-13 LAB — COMPREHENSIVE METABOLIC PANEL
ALT: 21 U/L (ref 0–44)
AST: 22 U/L (ref 15–41)
Albumin: 3.5 g/dL (ref 3.5–5.0)
Alkaline Phosphatase: 84 U/L (ref 38–126)
Anion gap: 7 (ref 5–15)
BUN: 10 mg/dL (ref 6–20)
CO2: 22 mmol/L (ref 22–32)
Calcium: 8.6 mg/dL — ABNORMAL LOW (ref 8.9–10.3)
Chloride: 106 mmol/L (ref 98–111)
Creatinine, Ser: 0.64 mg/dL (ref 0.44–1.00)
GFR, Estimated: >60 mL/min (ref >60)
Glucose, Bld: 95 mg/dL (ref 70–99)
Potassium: 3.8 mmol/L (ref 3.5–5.1)
Sodium: 135 mmol/L (ref 135–145)
Total Bilirubin: 0.1 mg/dL — ABNORMAL LOW (ref 0.3–1.2)
Total Protein: 7.3 g/dL (ref 6.5–8.1)

## 2021-02-13 LAB — CBC WITH DIFFERENTIAL/PLATELET
Abs Immature Granulocytes: 0.03 10*3/uL (ref 0.00–0.07)
Basophils Absolute: 0 10*3/uL (ref 0.0–0.1)
Basophils Relative: 0 %
Eosinophils Absolute: 0.3 10*3/uL (ref 0.0–0.5)
Eosinophils Relative: 3 %
HCT: 30.6 % — ABNORMAL LOW (ref 36.0–46.0)
Hemoglobin: 9.7 g/dL — ABNORMAL LOW (ref 12.0–15.0)
Immature Granulocytes: 0 %
Lymphocytes Relative: 25 %
Lymphs Abs: 2.7 10*3/uL (ref 0.7–4.0)
MCH: 26.6 pg (ref 26.0–34.0)
MCHC: 31.7 g/dL (ref 30.0–36.0)
MCV: 84.1 fL (ref 80.0–100.0)
Monocytes Absolute: 0.5 10*3/uL (ref 0.1–1.0)
Monocytes Relative: 5 %
Neutro Abs: 7 10*3/uL (ref 1.7–7.7)
Neutrophils Relative %: 67 %
Platelets: 465 10*3/uL — ABNORMAL HIGH (ref 150–400)
RBC: 3.64 MIL/uL — ABNORMAL LOW (ref 3.87–5.11)
RDW: 14.6 % (ref 11.5–15.5)
WBC: 10.6 10*3/uL — ABNORMAL HIGH (ref 4.0–10.5)
nRBC: 0 % (ref 0.0–0.2)

## 2021-02-13 LAB — LIPASE, BLOOD: Lipase: 27 U/L (ref 11–51)

## 2021-02-13 MED ORDER — ONDANSETRON HCL 4 MG/2ML IJ SOLN
4.0000 mg | Freq: Once | INTRAMUSCULAR | Status: AC
  Administered 2021-02-13: 01:00:00 4 mg via INTRAVENOUS
  Filled 2021-02-13: qty 2

## 2021-02-13 MED ORDER — MORPHINE SULFATE (PF) 4 MG/ML IV SOLN
4.0000 mg | Freq: Once | INTRAVENOUS | Status: AC
  Administered 2021-02-13: 01:00:00 4 mg via INTRAVENOUS
  Filled 2021-02-13: qty 1

## 2021-02-13 MED ORDER — MORPHINE SULFATE (PF) 4 MG/ML IV SOLN
4.0000 mg | Freq: Once | INTRAVENOUS | Status: AC
  Administered 2021-02-13: 02:00:00 4 mg via INTRAVENOUS
  Filled 2021-02-13: qty 1

## 2021-02-13 MED ORDER — ONDANSETRON HCL 4 MG PO TABS: 4.0000 mg | ORAL_TABLET | Freq: Four times a day (QID) | ORAL | 0 refills | Status: DC | PRN

## 2021-02-13 MED ORDER — HYDROCODONE-ACETAMINOPHEN 5-325 MG PO TABS: 1.0000 | ORAL_TABLET | ORAL | 0 refills | Status: DC | PRN

## 2021-02-13 NOTE — Discharge Instructions (Signed)
Please return in the morning for an ultrasound to make sure that you do not have gallstones causing your pain.  Also, the CT scan you had at [facility] showed a possible enlargement of the left ovary, and pelvic ultrasound was recommended.  I have ordered a pelvic ultrasound to be done at the same time you come back for the ultrasound to look at your gallbladder.  Return to the emergency department if your symptoms are not being adequately controlled at home.

## 2021-02-13 NOTE — ED Provider Notes (Signed)
[facility] EMERGENCY DEPARTMENT Provider Note   CSN: [id number] Arrival date & time: 02/12/21  2323     History  Chief Complaint  Patient presents with   Abdominal Pain    Gina Hurley is a 31 y.o. female.  The history is provided by the patient.  Abdominal Pain She has no significant past history and comes in with ongoing right upper quadrant pain.  Pain started 4 days ago and is constant.  She rates pain at 8/10.  There is associated nausea and vomiting.  Pain is worse after she eats.  She denies fever or chills but has had some sweats.  She went to [facility] emergency department yesterday and had CT scan which is reported to have been normal.  She has not been taking anything for her pain.   Home Medications Prior to Admission medications   Medication Sig Start Date End Date Taking? Authorizing Provider  acetaminophen (TYLENOL) 500 MG tablet Take 500 mg by mouth every 6 (six) hours as needed for pain.    [provider]  cyclobenzaprine (FLEXERIL) 10 MG tablet Take 0.5 tablets (5 mg total) by mouth 2 (two) times daily as needed for muscle spasms. 02/08/13   Marlon Pel, PA-C  ondansetron (ZOFRAN ODT) 4 MG disintegrating tablet Take 1 tablet (4 mg total) by mouth every 8 (eight) hours as needed for nausea. 09/02/12   Armando Reichert, CNM  Prenatal Vit-Fe Fumarate-FA (PRENATAL COMPLETE) 14-0.4 MG TABS Take 1 tablet by mouth once. 08/26/12   Minta Balsam, MD  ranitidine (ZANTAC) 150 MG tablet Take 1 tablet (150 mg total) by mouth at bedtime. 08/26/12 08/26/13  Minta Balsam, MD  traMADol (ULTRAM) 50 MG tablet Take 1 tablet (50 mg total) by mouth every 6 (six) hours as needed. 02/08/13   Marlon Pel, PA-C      Allergies    Shrimp [shellfish allergy]    Review of Systems   Review of Systems  Gastrointestinal:  Positive for abdominal pain.  All other systems reviewed and are negative.  Physical Exam Updated Vital Signs BP (!) 137/92    Pulse 92    Temp  98.3 F (36.8 C) (Oral)    Resp 16    Ht 5\' 5"  (1.651 m)    Wt 111.1 kg    LMP 02/11/2021    SpO2 99%    BMI 40.77 kg/m  Physical Exam Vitals and nursing note reviewed.  31 year old female, resting comfortably and in no acute distress. Vital signs are significant for borderline elevated blood pressure. Oxygen saturation is 99%, which is normal. Head is normocephalic and atraumatic. PERRLA, EOMI. Oropharynx is clear. Neck is nontender and supple without adenopathy or JVD. Back is nontender and there is no CVA tenderness. Lungs are clear without rales, wheezes, or rhonchi. Chest is nontender. Heart has regular rate and rhythm without murmur. Abdomen is soft, flat, with moderate right upper quadrant tenderness and +/- Murphy sign.  There is no rebound or guarding.  There are no masses or hepatosplenomegaly and peristalsis is hypoactive. Extremities have no cyanosis or edema, full range of motion is present. Skin is warm and dry without rash. Neurologic: Mental status is normal, cranial nerves are intact, moves all extremities equally.  ED Results / Procedures / Treatments   Labs (all labs ordered are listed, but only abnormal results are displayed) Labs Reviewed  COMPREHENSIVE METABOLIC PANEL - Abnormal; Notable for the following components:      Result Value  Calcium 8.6 (*)    Total Bilirubin 0.1 (*)    All other components within normal limits  CBC WITH DIFFERENTIAL/PLATELET - Abnormal; Notable for the following components:   WBC 10.6 (*)    RBC 3.64 (*)    Hemoglobin 9.7 (*)    HCT 30.6 (*)    Platelets 465 (*)    All other components within normal limits  LIPASE, BLOOD   Procedures Procedures    Medications Ordered in ED Medications  morphine 4 MG/ML injection 4 mg (has no administration in time range)  morphine 4 MG/ML injection 4 mg (4 mg Intravenous Given 02/13/21 0058)  ondansetron (ZOFRAN) injection 4 mg (4 mg Intravenous Given 02/13/21 0058)    ED Course/ Medical  Decision Making/ A&P                           Medical Decision Making Amount and/or Complexity of Data Reviewed Labs: ordered. Radiology: ordered.  Risk Prescription drug management.   Right upper quadrant pain concerning for possible biliary tract disease.  Consider appendicitis, diverticulitis, peptic ulcer disease.  Old records are reviewed confirming ED visit yesterday at [facility].  CT scan showed no acute intra-abdominal pathology although there was incidental finding of asymmetric enlargement of the left ovary with a recommendation for pelvic ultrasound.  WBC was elevated.  We will repeat CBC and metabolic panel.  She will need to have right upper quadrant ultrasound done as an outpatient, can obtain pelvic ultrasound at the same time.  Labs are reassuring.  WBC has decreased from yesterday.  Anemia is stable.  Lipase is normal.  She continued to complain of pain following morphine, she is given a second dose of morphine.  She is felt to be safe for discharge.  Outpatient right upper quadrant ultrasound and pelvic ultrasound have been ordered.  She is given a take-home pack of hydrocodone-acetaminophen and also given a prescription for ondansetron.  Return precautions discussed.        Final Clinical Impression(s) / ED Diagnoses Final diagnoses:  RUQ pain  Normochromic normocytic anemia  Left ovarian enlargement    Rx / DC Orders ED Discharge Orders          Ordered    US Abdomen Limited RUQ/Gall Gladder        02/13/21 0205    US PELVIC COMPLETE WITH TRANSVAGINAL        02/13/21 0205    HYDROcodone-acetaminophen (NORCO) 5-325 MG tablet  Every 4 hours PRN        02/13/21 0205    ondansetron (ZOFRAN) 4 MG tablet  Every 6 hours PRN        02/13/21 0205              Dione Booze, MD 02/13/21 0211

## 2021-02-14 MED FILL — Hydrocodone-Acetaminophen Tab 5-325 MG: ORAL | Qty: 6 | Status: CN

## 2021-02-14 MED FILL — Hydrocodone-Acetaminophen Tab 5-325 MG: ORAL | Qty: 6 | Status: AC

## 2021-02-15 ENCOUNTER — Other Ambulatory Visit: Payer: Self-pay

## 2021-02-15 ENCOUNTER — Encounter (HOSPITAL_COMMUNITY): Payer: Self-pay

## 2021-02-15 ENCOUNTER — Emergency Department (HOSPITAL_COMMUNITY): Payer: Medicaid Other

## 2021-02-15 ENCOUNTER — Inpatient Hospital Stay (HOSPITAL_COMMUNITY)
Admission: EM | Admit: 2021-02-15 | Discharge: 2021-02-17 | DRG: 418 | Disposition: A | Discharge status: Home / Self Care | Payer: Medicaid Other | Attending: Family Medicine | Admitting: Family Medicine

## 2021-02-15 DIAGNOSIS — N838 Other noninflammatory disorders of ovary, fallopian tube and broad ligament: Secondary | ICD-10-CM | POA: Diagnosis present

## 2021-02-15 DIAGNOSIS — Z833 Family history of diabetes mellitus: Secondary | ICD-10-CM

## 2021-02-15 DIAGNOSIS — K810 Acute cholecystitis: Secondary | ICD-10-CM | POA: Diagnosis present

## 2021-02-15 DIAGNOSIS — Z91013 Allergy to seafood: Secondary | ICD-10-CM

## 2021-02-15 DIAGNOSIS — Z6841 Body Mass Index (BMI) 40.0 and over, adult: Secondary | ICD-10-CM

## 2021-02-15 DIAGNOSIS — K8012 Calculus of gallbladder with acute and chronic cholecystitis without obstruction: Principal | ICD-10-CM | POA: Diagnosis present

## 2021-02-15 DIAGNOSIS — Z8249 Family history of ischemic heart disease and other diseases of the circulatory system: Secondary | ICD-10-CM

## 2021-02-15 DIAGNOSIS — N390 Urinary tract infection, site not specified: Secondary | ICD-10-CM | POA: Diagnosis present

## 2021-02-15 DIAGNOSIS — K811 Chronic cholecystitis: Secondary | ICD-10-CM

## 2021-02-15 DIAGNOSIS — F17290 Nicotine dependence, other tobacco product, uncomplicated: Secondary | ICD-10-CM | POA: Diagnosis present

## 2021-02-15 DIAGNOSIS — Z825 Family history of asthma and other chronic lower respiratory diseases: Secondary | ICD-10-CM

## 2021-02-15 DIAGNOSIS — B951 Streptococcus, group B, as the cause of diseases classified elsewhere: Secondary | ICD-10-CM | POA: Diagnosis present

## 2021-02-15 DIAGNOSIS — R1011 Right upper quadrant pain: Secondary | ICD-10-CM | POA: Diagnosis not present

## 2021-02-15 DIAGNOSIS — D649 Anemia, unspecified: Secondary | ICD-10-CM | POA: Diagnosis present

## 2021-02-15 DIAGNOSIS — F319 Bipolar disorder, unspecified: Secondary | ICD-10-CM | POA: Diagnosis present

## 2021-02-15 DIAGNOSIS — R109 Unspecified abdominal pain: Secondary | ICD-10-CM | POA: Diagnosis present

## 2021-02-15 LAB — COMPREHENSIVE METABOLIC PANEL
ALT: 17 U/L (ref 0–44)
AST: 15 U/L (ref 15–41)
Albumin: 3.5 g/dL (ref 3.5–5.0)
Alkaline Phosphatase: 87 U/L (ref 38–126)
Anion gap: 7 (ref 5–15)
BUN: 9 mg/dL (ref 6–20)
CO2: 26 mmol/L (ref 22–32)
Calcium: 9 mg/dL (ref 8.9–10.3)
Chloride: 104 mmol/L (ref 98–111)
Creatinine, Ser: 0.66 mg/dL (ref 0.44–1.00)
GFR, Estimated: >60 mL/min (ref >60)
Glucose, Bld: 108 mg/dL — ABNORMAL HIGH (ref 70–99)
Potassium: 3.6 mmol/L (ref 3.5–5.1)
Sodium: 137 mmol/L (ref 135–145)
Total Bilirubin: 0.1 mg/dL — ABNORMAL LOW (ref 0.3–1.2)
Total Protein: 7.3 g/dL (ref 6.5–8.1)

## 2021-02-15 LAB — CBC WITH DIFFERENTIAL/PLATELET
Abs Immature Granulocytes: 0.07 10*3/uL (ref 0.00–0.07)
Basophils Absolute: 0.1 10*3/uL (ref 0.0–0.1)
Basophils Relative: 1 %
Eosinophils Absolute: 0.3 10*3/uL (ref 0.0–0.5)
Eosinophils Relative: 3 %
HCT: 31.7 % — ABNORMAL LOW (ref 36.0–46.0)
Hemoglobin: 10.4 g/dL — ABNORMAL LOW (ref 12.0–15.0)
Immature Granulocytes: 1 %
Lymphocytes Relative: 19 %
Lymphs Abs: 2.5 10*3/uL (ref 0.7–4.0)
MCH: 27.5 pg (ref 26.0–34.0)
MCHC: 32.8 g/dL (ref 30.0–36.0)
MCV: 83.9 fL (ref 80.0–100.0)
Monocytes Absolute: 0.7 10*3/uL (ref 0.1–1.0)
Monocytes Relative: 5 %
Neutro Abs: 9.4 10*3/uL — ABNORMAL HIGH (ref 1.7–7.7)
Neutrophils Relative %: 71 %
Platelets: 521 10*3/uL — ABNORMAL HIGH (ref 150–400)
RBC: 3.78 MIL/uL — ABNORMAL LOW (ref 3.87–5.11)
RDW: 14.6 % (ref 11.5–15.5)
WBC: 13 10*3/uL — ABNORMAL HIGH (ref 4.0–10.5)
nRBC: 0 % (ref 0.0–0.2)

## 2021-02-15 LAB — HEPATIC FUNCTION PANEL
ALT: 18 U/L (ref 0–44)
AST: 17 U/L (ref 15–41)
Albumin: 3.4 g/dL — ABNORMAL LOW (ref 3.5–5.0)
Alkaline Phosphatase: 89 U/L (ref 38–126)
Bilirubin, Direct: <0.1 mg/dL (ref 0.0–0.2)
Total Bilirubin: 0.2 mg/dL — ABNORMAL LOW (ref 0.3–1.2)
Total Protein: 7.3 g/dL (ref 6.5–8.1)

## 2021-02-15 LAB — URINALYSIS, ROUTINE W REFLEX MICROSCOPIC
Bacteria, UA: NONE SEEN
Bilirubin Urine: NEGATIVE
Glucose, UA: NEGATIVE mg/dL
Ketones, ur: NEGATIVE mg/dL
Nitrite: NEGATIVE
Protein, ur: 30 mg/dL — AB
Specific Gravity, Urine: 1.021 (ref 1.005–1.030)
pH: 6 (ref 5.0–8.0)

## 2021-02-15 LAB — IRON AND TIBC
Iron: 13 ug/dL — ABNORMAL LOW (ref 28–170)
Saturation Ratios: 3 % — ABNORMAL LOW (ref 10.4–31.8)
TIBC: 386 ug/dL (ref 250–450)
UIBC: 373 ug/dL

## 2021-02-15 LAB — LIPASE, BLOOD: Lipase: 24 U/L (ref 11–51)

## 2021-02-15 LAB — PREGNANCY, URINE: Preg Test, Ur: NEGATIVE

## 2021-02-15 LAB — FERRITIN: Ferritin: 18 ng/mL (ref 11–307)

## 2021-02-15 MED ORDER — MORPHINE SULFATE (PF) 2 MG/ML IV SOLN
2.0000 mg | INTRAVENOUS | Status: DC | PRN
  Administered 2021-02-15 – 2021-02-16 (×2): 2 mg via INTRAVENOUS
  Filled 2021-02-15 (×2): qty 1

## 2021-02-15 MED ORDER — SODIUM CHLORIDE 0.9 % IV SOLN
2.0000 g | Freq: Every day | INTRAVENOUS | Status: DC
  Administered 2021-02-15: 2 g via INTRAVENOUS
  Filled 2021-02-15 (×2): qty 20

## 2021-02-15 MED ORDER — SODIUM CHLORIDE 0.9 % IV BOLUS
1000.0000 mL | Freq: Once | INTRAVENOUS | Status: AC
  Administered 2021-02-15: 1000 mL via INTRAVENOUS

## 2021-02-15 MED ORDER — MORPHINE SULFATE (PF) 4 MG/ML IV SOLN
4.0000 mg | Freq: Once | INTRAVENOUS | Status: AC
  Administered 2021-02-15: 4 mg via INTRAVENOUS
  Filled 2021-02-15: qty 1

## 2021-02-15 MED ORDER — POTASSIUM CHLORIDE IN NACL 20-0.9 MEQ/L-% IV SOLN: INTRAVENOUS | Status: AC

## 2021-02-15 MED ORDER — ONDANSETRON HCL 4 MG/2ML IJ SOLN
4.0000 mg | Freq: Once | INTRAMUSCULAR | Status: AC
  Administered 2021-02-15: 4 mg via INTRAVENOUS
  Filled 2021-02-15: qty 2

## 2021-02-15 MED ORDER — ONDANSETRON HCL 4 MG PO TABS: 4.0000 mg | ORAL_TABLET | Freq: Four times a day (QID) | ORAL | Status: DC | PRN

## 2021-02-15 MED ORDER — BOOST / RESOURCE BREEZE PO LIQD CUSTOM
1.0000 | Freq: Three times a day (TID) | ORAL | Status: DC
  Administered 2021-02-15 – 2021-02-17 (×2): 1 via ORAL

## 2021-02-15 MED ORDER — ONDANSETRON HCL 4 MG/2ML IJ SOLN: 4.0000 mg | Freq: Four times a day (QID) | INTRAMUSCULAR | Status: DC | PRN

## 2021-02-15 MED ORDER — ACETAMINOPHEN 325 MG PO TABS
650.0000 mg | ORAL_TABLET | Freq: Four times a day (QID) | ORAL | Status: DC | PRN
  Administered 2021-02-16: 650 mg via ORAL
  Filled 2021-02-15: qty 2

## 2021-02-15 MED ORDER — ACETAMINOPHEN 650 MG RE SUPP: 650.0000 mg | Freq: Four times a day (QID) | RECTAL | Status: DC | PRN

## 2021-02-15 NOTE — Plan of Care (Signed)

## 2021-02-15 NOTE — Consult Note (Signed)
Referring Provider: Burgess Amor, PA-C Primary Care Physician:  Evelene Croon, MD Primary Gastroenterologist:  Dr. Jena Gauss, prior admission in 2012 was seen by Dr. Russella Dar for ERCP  Date of Admission: 02/15/21 Date of Consultation: 02/15/21  Reason for Consultation:  Abdominal pain  HPI:  Gina Hurley is a 31 y.o. year old female presenting to the ED today with 6 day history of RUQ pain. This is her 3rd presentation to the ED since Jan 22nd due to pain. LFTs are normal. US abdomen with mildly dilated CBD tapering distally. Normal gallbladder. Possible fatty liver. Lipase normal. WBC count 13. Hgb 10.4. CT abd/pelvis without contrast was completed in [facility] 1/23 without acute findings. There was asymmetric enlargement of left ovary. WBC count 12.3 on 1/22.   Patient notes prior episode of RUQ pain, N/V in Jan 2012. She was [redacted] weeks pregnant and presented to [facility] with elevated transaminases, MRCP with dilated CBD at 12 mm in size with filling defect, small gallstones in gallbladder. Subsequently,  Dr. Russella Dar  performed ERCP with sphincterotomy, sludge in CBD s/p removal, all apparent sludge/stones removed.   She notes pain is constant in RUQ. Some lesser pain LUQ but predominantly RUQ. Worsened with eating. No vomiting. Associated nausea. Now with pain radiating into right chest. Hurts to take deep breaths. Also noted vaginal bleeding, which she was concerned about as her period ended 4 days ago. US pelvis completed.    Past Medical History:  Diagnosis Date   Bipolar 1 disorder (HCC)    Gallbladder attack    Mental disorder    No pertinent past medical history     Past Surgical History:  Procedure Laterality Date   ADENOIDECTOMY     as a child   Tubes in Ears      Prior to Admission medications   Medication Sig Start Date End Date Taking? Authorizing Provider  HYDROcodone-acetaminophen (NORCO) 5-325 MG tablet Take 1 tablet by mouth every 4 (four) hours as needed for moderate  pain. 02/13/21  Yes Dione Booze, MD  ondansetron (ZOFRAN) 4 MG tablet Take 1 tablet (4 mg total) by mouth every 6 (six) hours as needed for nausea or vomiting. 02/13/21  Yes Dione Booze, MD    No current facility-administered medications for this encounter.   Current Outpatient Medications  Medication Sig Dispense Refill   HYDROcodone-acetaminophen (NORCO) 5-325 MG tablet Take 1 tablet by mouth every 4 (four) hours as needed for moderate pain. 6 tablet 0   ondansetron (ZOFRAN) 4 MG tablet Take 1 tablet (4 mg total) by mouth every 6 (six) hours as needed for nausea or vomiting. 12 tablet 0    Allergies as of 02/15/2021 - Review Complete 02/15/2021  Allergen Reaction Noted   Shrimp [shellfish allergy] Anaphylaxis 09/28/2012   Iodine Rash 07/17/2018    Family History  Problem Relation Age of Onset   Diabetes Father    Hypertension Father    Diabetes Maternal Aunt    Hypertension Maternal Aunt    Hypertension Maternal Uncle    Hypertension Maternal Grandmother    Asthma Paternal Grandmother     Social History   Socioeconomic History   Marital status: Single    Spouse name: Not on file   Number of children: Not on file   Years of education: Not on file   Highest education level: Not on file  Occupational History   Not on file  Tobacco Use   Smoking status: Some Days    Packs/day: 0.30  Types: Cigarettes   Smokeless tobacco: Never  Vaping Use   Vaping Use: Every day  Substance and Sexual Activity   Alcohol use: No   Drug use: No   Sexual activity: Yes    Comment: SAYS QUIT   4 WEEKS AGO   Other Topics Concern   Not on file  Social History Narrative   Not on file   Social Determinants of Health   Financial Resource Strain: Not on file  Food Insecurity: Not on file  Transportation Needs: Not on file  Physical Activity: Not on file  Stress: Not on file  Social Connections: Not on file  Intimate Partner Violence: Not on file    Review of Systems: Gen:  Denies fever, chills, loss of appetite, change in weight or weight loss CV: Denies chest pain, heart palpitations, syncope, edema  Resp: Denies shortness of breath with rest, cough, wheezing GI: see HPI GU : Denies urinary burning, urinary frequency, urinary incontinence.  MS: Denies joint pain,swelling, cramping Derm: Denies rash, itching, dry skin Psych: Denies depression, anxiety,confusion, or memory loss Heme: see HPI  Physical Exam: Vital signs in last 24 hours: Temp:  [98.7 F (37.1 C)] 98.7 F (37.1 C) (01/26 0842) Pulse Rate:  [67-88] 74 (01/26 1330) Resp:  [18-20] 20 (01/26 1330) BP: (114-144)/(61-90) 128/77 (01/26 1330) SpO2:  [99 %-100 %] 100 % (01/26 1330) Weight:  [111 kg-111.1 kg] 111.1 kg (01/26 0842)   General:   Alert,  Well-developed, well-nourished, pleasant and cooperative in NAD Head:  Normocephalic and atraumatic. Eyes:  Sclera clear, no icterus.   Conjunctiva pink. Ears:  Normal auditory acuity. Nose:  No deformity, discharge,  or lesions. Mouth:  No deformity or lesions, dentition normal. Lungs:  Clear throughout to auscultation.   No wheezes, crackles, or rhonchi. No acute distress. Heart:  S1 S2 present; no murmurs, clicks, rubs,  or gallops. Abdomen:  Soft, TTP RUQ and nondistended. No masses, hepatosplenomegaly or hernias noted. Normal bowel sounds, without guarding, and without rebound.   Rectal:  Deferred  Msk:  Symmetrical without gross deformities. Normal posture. Pulses:  Normal pulses noted. Extremities:  Without edema. Neurologic:  Alert and  oriented x4 Psych:  Alert and cooperative. Normal mood and affect.  Intake/Output from previous day: No intake/output data recorded. Intake/Output this shift: Total I/O In: 1000 [IV Piggyback:1000] Out: -   Lab Results: Recent Labs    02/13/21 0044 02/15/21 0958  WBC 10.6* 13.0*  HGB 9.7* 10.4*  HCT 30.6* 31.7*  PLT 465* 521*   BMET Recent Labs    02/13/21 0044 02/15/21 0958  NA 135  137  K 3.8 3.6  CL 106 104  CO2 22 26  GLUCOSE 95 108*  BUN 10 9  CREATININE 0.64 0.66  CALCIUM 8.6* 9.0   LFT Recent Labs    02/13/21 0044 02/15/21 0958  PROT 7.3 7.3  ALBUMIN 3.5 3.5  AST 22 15  ALT 21 17  ALKPHOS 84 87  BILITOT 0.1* 0.1*     Studies/Results: US PELVIC COMPLETE WITH TRANSVAGINAL  Result Date: 02/15/2021 CLINICAL DATA:  Left ovarian mass seen in the CT EXAM: TRANSABDOMINAL AND TRANSVAGINAL ULTRASOUND OF PELVIS DOPPLER ULTRASOUND OF OVARIES TECHNIQUE: Both transabdominal and transvaginal ultrasound examinations of the pelvis were performed. Transabdominal technique was performed for global imaging of the pelvis including uterus, ovaries, adnexal regions, and pelvic cul-de-sac. It was necessary to proceed with endovaginal exam following the transabdominal exam to visualize the ovaries. Color and duplex Doppler ultrasound was  utilized to evaluate blood flow to the ovaries. COMPARISON:  04/15/2018 FINDINGS: Uterus Measurements: 7.9 x 4 x 5.7 cm = volume: 93.2 mL. There is inhomogeneous echogenicity without discrete nodules. Endometrium Thickness: Endometrial stripe measures 11 mm. There are small hyperechoic foci in the endometrium within the fundus without definite acoustic shadowing. Right ovary Measurements: 3.5 x 2 x 2.2 = volume:  8 mL.  Unremarkable Left ovary Left ovary is not sonographically visualized and not evaluated. Color Doppler evaluation of right ovary demonstrates vascular flow. Other findings No abnormal free fluid. IMPRESSION: Endometrial stripe is prominent measuring 11 mm with questionable minimal calcifications. There is inhomogeneous echogenicity in myometrium without discrete nodules. Left ovary is not sonographically visualized and not evaluated. Electronically Signed   By: Ernie Avena M.D.   On: 02/15/2021 12:30   US Abdomen Limited RUQ (LIVER/GB)  Result Date: 02/15/2021 CLINICAL DATA:  Right upper quadrant pain for 6 days. EXAM:  ULTRASOUND ABDOMEN LIMITED RIGHT UPPER QUADRANT COMPARISON:  CT abdomen and pelvis 02/11/2021 FINDINGS: Gallbladder: No gallstones or wall thickening visualized. No sonographic Murphy sign noted by sonographer. Common bile duct: Diameter: 10 mm proximally with distal tapering Liver: Increased parenchymal echogenicity diffusely without a focal lesion identified. Portal vein is patent on color Doppler imaging with normal direction of blood flow towards the liver. Other: None. IMPRESSION: 1. Mildly dilated common bile duct which tapers distally. Recommend laboratory correlation. 2. Normal appearance of the gallbladder. 3. Echogenic liver, nonspecific though may reflect steatosis. Electronically Signed   By: Sebastian Ache M.D.   On: 02/15/2021 12:23    Impression: 31 y.o. year old female presenting to the ED today with 6 day history of RUQ pain, previously presenting two other times. US abdomen with mildly dilated CBD tapering distally. Normal gallbladder. Possible fatty liver. Lipase normal. WBC count 13. CT abd/pelvis without contrast was completed in [facility] 1/23 without acute findings. There was asymmetric enlargement of left ovary. Interestingly, she had similar pain in 2012 while pregnant with elevated transaminases, dilated CBD at 12 mm with filling defect and small gallstones in gallbladder on MRCP; she underwent ERCP by Dr. Damita Lack with sphincterotomy and stone/sludge removal. Plans had been for outpatient elective cholecystectomy, but she was lost to follow-up.  In light of normal LFTs, holding off on MRCP. She does have mildly dilated CBD that could be secondary to scarring after sphincterotomy and decreased flow from bile duct. We are rechecking HFP now to see if any transient bumps, which could go along with sphincter of Oddi dysfunction.   Requesting Surgical input as cholecystectomy recommended.   Also noted to have Hgb 10.4. normocytic anemia. Prior Hgb 12.8 several years ago. She has no overt GI  bleeding but does note vaginal bleeding as well. Will check iron studies. Could pursue further as outpatient.   Leukocytosis: WBC count 13, up from 12.3 when seen in ED at [facility] on 1/22. Afebrile. Continue to monitor.    Plan: Clear liquids Recheck HFP now Follow CBC, HFP Check iron studies Requesting surgical input Further recommendations to follow   Gelene Mink, PhD, ANP-BC [facility]     LOS: 0 days    02/15/2021, 2:57 PM

## 2021-02-15 NOTE — ED Provider Notes (Signed)
[facility] EMERGENCY DEPARTMENT Provider Note   CSN: [id number] Arrival date & time: 02/15/21  [id number]     History  Chief Complaint  Patient presents with   Abdominal Pain    Gina Hurley is a 31 y.o. female with no significant past medical history returning today with complaints of persistent upper abdominal pain.  She describes waxing and waning pain that is constant in her right and left upper abdomen which is been present now for the past 6 days.  She has localizing pain in the right upper quadrant when she takes a deep inspiration as well.  She endorses nausea without emesis and states food intake has been reduced as any consumption worsens her pain.  She has had no vomiting, no diarrhea, no complaints of fevers or chills.  She was seen at [facility] 3 days ago and a CT scan revealed a left ovarian enlargement.  She denies pelvic pain.  Her LMP was 4 days ago and reported as normal.  She denies pregnancy.  At her visit 2 days ago her labs were reassuring but she was scheduled for outpatient gallbladder ultrasound and pelvic ultrasound.  However she was unable to complete these as she did not have a ride to get to these appointments yesterday.  She has found no alleviators for symptoms.  She denies dysuria and vaginal discharge.  Denies risk for STDs.  With ports that she does not feel constipated but has not had a BM in 3 days.     The history is provided by the patient.      Home Medications Prior to Admission medications   Medication Sig Start Date End Date Taking? Authorizing Provider  HYDROcodone-acetaminophen (NORCO) 5-325 MG tablet Take 1 tablet by mouth every 4 (four) hours as needed for moderate pain. 02/13/21  Yes Dione BoozeGlick, David, MD  ondansetron (ZOFRAN) 4 MG tablet Take 1 tablet (4 mg total) by mouth every 6 (six) hours as needed for nausea or vomiting. 02/13/21  Yes Dione BoozeGlick, David, MD      Allergies    Shrimp [shellfish allergy] and Iodine    Review of Systems    Review of Systems  Constitutional:  Positive for appetite change. Negative for fever.  HENT:  Negative for congestion and sore throat.   Eyes: Negative.   Respiratory:  Negative for chest tightness and shortness of breath.   Cardiovascular:  Negative for chest pain.  Gastrointestinal:  Positive for abdominal pain and nausea. Negative for diarrhea and vomiting.  Genitourinary: Negative.   Musculoskeletal:  Negative for arthralgias, joint swelling and neck pain.  Skin: Negative.  Negative for rash and wound.  Neurological:  Negative for dizziness, weakness, light-headedness, numbness and headaches.  Psychiatric/Behavioral: Negative.     Physical Exam Updated Vital Signs BP 137/82 (BP Location: Right Arm)    Pulse 64    Temp 98.4 F (36.9 C) (Oral)    Resp 16    Ht 5\' 5"  (1.651 m)    Wt 111.1 kg    LMP 02/11/2021    SpO2 100%    BMI 40.77 kg/m  Physical Exam Vitals and nursing note reviewed.  Constitutional:      Appearance: She is well-developed.  HENT:     Head: Normocephalic and atraumatic.  Eyes:     Conjunctiva/sclera: Conjunctivae normal.  Cardiovascular:     Rate and Rhythm: Normal rate and regular rhythm.     Heart sounds: Normal heart sounds.  Pulmonary:     Effort: Pulmonary  effort is normal.     Breath sounds: Normal breath sounds. No wheezing.  Abdominal:     General: Abdomen is protuberant. Bowel sounds are normal.     Palpations: Abdomen is soft.     Tenderness: There is abdominal tenderness in the right upper quadrant and left upper quadrant. There is no guarding or rebound. Negative signs include Murphy's sign.  Musculoskeletal:        General: Normal range of motion.     Cervical back: Normal range of motion.  Skin:    General: Skin is warm and dry.  Neurological:     Mental Status: She is alert.    ED Results / Procedures / Treatments   Labs (all labs ordered are listed, but only abnormal results are displayed) Labs Reviewed  CBC WITH  DIFFERENTIAL/PLATELET - Abnormal; Notable for the following components:      Result Value   WBC 13.0 (*)    RBC 3.78 (*)    Hemoglobin 10.4 (*)    HCT 31.7 (*)    Platelets 521 (*)    Neutro Abs 9.4 (*)    All other components within normal limits  COMPREHENSIVE METABOLIC PANEL - Abnormal; Notable for the following components:   Glucose, Bld 108 (*)    Total Bilirubin 0.1 (*)    All other components within normal limits  URINALYSIS, ROUTINE W REFLEX MICROSCOPIC - Abnormal; Notable for the following components:   APPearance CLOUDY (*)    Hgb urine dipstick LARGE (*)    Protein, ur 30 (*)    Leukocytes,Ua LARGE (*)    All other components within normal limits  HEPATIC FUNCTION PANEL - Abnormal; Notable for the following components:   Albumin 3.4 (*)    Total Bilirubin 0.2 (*)    All other components within normal limits  IRON AND TIBC - Abnormal; Notable for the following components:   Iron 13 (*)    Saturation Ratios 3 (*)    All other components within normal limits  URINE CULTURE  LIPASE, BLOOD  PREGNANCY, URINE  FERRITIN  HIV ANTIBODY (ROUTINE TESTING W REFLEX)  COMPREHENSIVE METABOLIC PANEL  CBC    EKG None  Radiology US PELVIC COMPLETE WITH TRANSVAGINAL  Result Date: 02/15/2021 CLINICAL DATA:  Left ovarian mass seen in the CT EXAM: TRANSABDOMINAL AND TRANSVAGINAL ULTRASOUND OF PELVIS DOPPLER ULTRASOUND OF OVARIES TECHNIQUE: Both transabdominal and transvaginal ultrasound examinations of the pelvis were performed. Transabdominal technique was performed for global imaging of the pelvis including uterus, ovaries, adnexal regions, and pelvic cul-de-sac. It was necessary to proceed with endovaginal exam following the transabdominal exam to visualize the ovaries. Color and duplex Doppler ultrasound was utilized to evaluate blood flow to the ovaries. COMPARISON:  04/15/2018 FINDINGS: Uterus Measurements: 7.9 x 4 x 5.7 cm = volume: 93.2 mL. There is inhomogeneous echogenicity  without discrete nodules. Endometrium Thickness: Endometrial stripe measures 11 mm. There are small hyperechoic foci in the endometrium within the fundus without definite acoustic shadowing. Right ovary Measurements: 3.5 x 2 x 2.2 = volume:  8 mL.  Unremarkable Left ovary Left ovary is not sonographically visualized and not evaluated. Color Doppler evaluation of right ovary demonstrates vascular flow. Other findings No abnormal free fluid. IMPRESSION: Endometrial stripe is prominent measuring 11 mm with questionable minimal calcifications. There is inhomogeneous echogenicity in myometrium without discrete nodules. Left ovary is not sonographically visualized and not evaluated. Electronically Signed   By: Ernie Avena M.D.   On: 02/15/2021 12:30   US  Abdomen Limited RUQ (LIVER/GB)  Result Date: 02/15/2021 CLINICAL DATA:  Right upper quadrant pain for 6 days. EXAM: ULTRASOUND ABDOMEN LIMITED RIGHT UPPER QUADRANT COMPARISON:  CT abdomen and pelvis 02/11/2021 FINDINGS: Gallbladder: No gallstones or wall thickening visualized. No sonographic Murphy sign noted by sonographer. Common bile duct: Diameter: 10 mm proximally with distal tapering Liver: Increased parenchymal echogenicity diffusely without a focal lesion identified. Portal vein is patent on color Doppler imaging with normal direction of blood flow towards the liver. Other: None. IMPRESSION: 1. Mildly dilated common bile duct which tapers distally. Recommend laboratory correlation. 2. Normal appearance of the gallbladder. 3. Echogenic liver, nonspecific though may reflect steatosis. Electronically Signed   By: Sebastian Ache M.D.   On: 02/15/2021 12:23    Procedures Procedures    Medications Ordered in ED Medications  morphine 2 MG/ML injection 2 mg (has no administration in time range)  cefTRIAXone (ROCEPHIN) 2 g in sodium chloride 0.9 % 100 mL IVPB (2 g Intravenous New Bag/Given 02/15/21 1837)  0.9 % NaCl with KCl 20 mEq/ L  infusion (has no  administration in time range)  acetaminophen (TYLENOL) tablet 650 mg (has no administration in time range)    Or  acetaminophen (TYLENOL) suppository 650 mg (has no administration in time range)  ondansetron (ZOFRAN) tablet 4 mg (has no administration in time range)    Or  ondansetron (ZOFRAN) injection 4 mg (has no administration in time range)  feeding supplement (BOOST / RESOURCE BREEZE) liquid 1 Container (has no administration in time range)  sodium chloride 0.9 % bolus 1,000 mL (0 mLs Intravenous Stopped 02/15/21 1236)  ondansetron (ZOFRAN) injection 4 mg (4 mg Intravenous Given 02/15/21 0959)  morphine 4 MG/ML injection 4 mg (4 mg Intravenous Given 02/15/21 1001)  morphine 4 MG/ML injection 4 mg (4 mg Intravenous Given 02/15/21 1540)    ED Course/ Medical Decision Making/ A&P                           Medical Decision Making Pt with persistent RUQ abdominal pain with normal LFT's both 2 days ago and again today, but with concern for dilated CBD on US imaging today.  [place] of pelvis also completed given CT imaging from [facility] on 1/24 suggesting left enlarged ovary, not seen on todays [place].    Amount and/or Complexity of Data Reviewed External Data Reviewed: radiology.    Details: Imaging and labs from [facility]: ordered.    Details: Normocytic anemia at 10.4 which is stable, wbc 13.0. large hemoglobin in urine, curent menses. Radiology: ordered.    Details: [place] with normal GB but elevated cbd. Discussion of management or test interpretation with external provider(s): Discussed with Dr. Jena Gauss who recommended admission for HIDA scan.  This pt may need ERCP.  He will consult in am.  Requesting medicine admission.  Discussed with Dr. Mariea Clonts who accepts pt for admission.   Risk Parenteral controlled substances. Decision regarding hospitalization.           Final Clinical Impression(s) / ED Diagnoses Final diagnoses:  RUQ abdominal pain  Ovarian mass, left     Rx / DC Orders ED Discharge Orders     None         Victoriano Lain 02/15/21 2020    Pollyann Savoy, MD 02/16/21 1453

## 2021-02-15 NOTE — ED Triage Notes (Signed)
Pt arrived via EMS with complaints of abdominal pain and abnormal vaginal bleeding. Pt states that she has had increased pain since she was last seen here.

## 2021-02-15 NOTE — ED Notes (Signed)
Pt verbalized understanding of MSE process °

## 2021-02-15 NOTE — ED Notes (Signed)
Patient given ice chips at this time. 

## 2021-02-15 NOTE — H&P (Addendum)
History and Physical    Gina JeffersonKristy N Hurley WUJ:[id number] DOB: 07/15/1990 DOA: 02/15/2021  PCP: Evelene CroonNiemeyer, Meindert, MD   Patient coming from: Home  I have personally briefly reviewed patient's [facility]  Chief Complaint: Abdominal pain  HPI: Gina Hurley is a 31 y.o. female with medical history significant for bipolar disorder.  She presented to the ED with complaints of abdominal pain of 6 days duration.  She reports mostly sharp right upper abdominal pain, she feels it might be worse with food she is not sure, with associated nausea, but no vomiting.  She reports abdominal pain now involves the left side of her abdomen.  No diarrhea.  [facility] on the [facility], came here to the ED on the 24th, subsequently here again today for the same abdominal pain which she feels is worsening. Reports yesterday she started having lower abdominal pain, different from her period pain. Yesterday she saw a little amount of blood from her vagina, but today she saw a more significant amount like she was on her period.  Patient said she just completed her period 4 days ago, started on the 15th.  Normally her period last for 7 days.  She denies prior episodes of irregular or abnormal vaginal bleeding. She reports increased urinary frequency over the past 2 days.  Denies dysuria.  Per Care [facility]where [facility]-without acute intra-abdominal or pelvic pathology, showed asymmetric enlargement of left ovary.  Pelvic ultrasound recommended.  ED visits 1/24 here and pain, patient was referred to completes pelvic ultrasound as outpatient, unfortunately she was unable to find transportation.  ED Course: Stable vitals.  Pregnancy test negative.  Upper quadrant abdominal ultrasound sound shows mildly dilated common bile duct which tapers distally, lab correlation recommended.  Normal gallbladder. Pelvic ultrasound-11 mm endometrial stripe with  questionable minimal calcifications.  Inhomogeneous echogenicity in the myometrium without discrete nodules, left ovary not visualized or evaluated. GI was consulted, and subsequently general surgery.  Plan for hospitalization for further work-up.  Review of Systems: As per HPI all other systems reviewed and negative.  Past Medical History:  Diagnosis Date   Bipolar 1 disorder (HCC)    Gallbladder attack    Mental disorder    No pertinent past medical history     Past Surgical History:  Procedure Laterality Date   ADENOIDECTOMY     as a child   Tubes in Ears       reports that she has been smoking cigarettes. She has been smoking an average of .3 packs per day. She has never used smokeless tobacco. She reports that she does not drink alcohol and does not use drugs.  Allergies  Allergen Reactions   Shrimp [Shellfish Allergy] Anaphylaxis   Iodine Rash    Family History  Problem Relation Age of Onset   Diabetes Father    Hypertension Father    Diabetes Maternal Aunt    Hypertension Maternal Aunt    Hypertension Maternal Uncle    Hypertension Maternal Grandmother    Asthma Paternal Grandmother    Prior to Admission medications   Medication Sig Start Date End Date Taking? Authorizing Provider  HYDROcodone-acetaminophen (NORCO) 5-325 MG tablet Take 1 tablet by mouth every 4 (four) hours as needed for moderate pain. 02/13/21  Yes Dione BoozeGlick, David, MD  ondansetron (ZOFRAN) 4 MG tablet Take 1 tablet (4 mg total) by mouth every 6 (six) hours as needed for nausea or vomiting.  02/13/21  Yes Dione Booze, MD    Physical Exam: Vitals:   02/15/21 1030 02/15/21 1100 02/15/21 1330 02/15/21 1600  BP: 114/61 131/75 128/77 117/78  Pulse: 75 67 74 72  Resp: 18 18 20 16   Temp:      TempSrc:      SpO2: 99% 100% 100% 100%  Weight:      Height:        Constitutional: NAD, calm, comfortable Vitals:   02/15/21 1030 02/15/21 1100 02/15/21 1330 02/15/21 1600  BP: 114/61 131/75 128/77 117/78   Pulse: 75 67 74 72  Resp: 18 18 20 16   Temp:      TempSrc:      SpO2: 99% 100% 100% 100%  Weight:      Height:       Eyes: PERRL, lids and conjunctivae normal ENMT: Mucous membranes are moist.   Neck: normal, supple, no masses, no thyromegaly Respiratory: clear to auscultation bilaterally, no wheezing, no crackles. Normal respiratory effort. No accessory muscle use.  Cardiovascular: Regular rate and rhythm, no murmurs / rubs / gallops. No extremity edema. 2+ pedal pulses.  Abdomen: Minimal tenderness RUQ,  no masses palpated. No hepatosplenomegaly. Bowel sounds positive.  Musculoskeletal: no clubbing / cyanosis. No joint deformity upper and lower extremities. Good ROM, no contractures. Normal muscle tone.  Skin: no rashes, lesions, ulcers. No induration Neurologic: No apparent cranial nerve abnormalities, moving extremities spontaneously.  Psychiatric: Normal judgment and insight. Alert and oriented x 3. Normal mood.   Labs on Admission: I have personally reviewed following labs and imaging studies  CBC: Recent Labs  Lab 02/13/21 0044 02/15/21 0958  WBC 10.6* 13.0*  NEUTROABS 7.0 9.4*  HGB 9.7* 10.4*  HCT 30.6* 31.7*  MCV 84.1 83.9  PLT 465* 521*   Basic Metabolic Panel: Recent Labs  Lab 02/13/21 0044 02/15/21 0958  NA 135 137  K 3.8 3.6  CL 106 104  CO2 22 26  GLUCOSE 95 108*  BUN 10 9  CREATININE 0.64 0.66  CALCIUM 8.6* 9.0   GFR: Estimated Creatinine Clearance: 128.7 mL/min (by C-G formula based on SCr of 0.66 mg/dL). Liver Function Tests: Recent Labs  Lab 02/13/21 0044 02/15/21 0958  AST 22 15  ALT 21 17  ALKPHOS 84 87  BILITOT 0.1* 0.1*  PROT 7.3 7.3  ALBUMIN 3.5 3.5   Recent Labs  Lab 02/13/21 0044 02/15/21 0958  LIPASE 27 24   Urine analysis:    Component Value Date/Time   COLORURINE YELLOW 02/15/2021 0946   APPEARANCEUR CLOUDY (A) 02/15/2021 0946   APPEARANCEUR Clear 03/18/2013 1856   LABSPEC 1.021 02/15/2021 0946   LABSPEC 1.025  03/18/2013 1856   PHURINE 6.0 02/15/2021 0946   GLUCOSEU NEGATIVE 02/15/2021 0946   GLUCOSEU Negative 03/18/2013 1856   HGBUR LARGE (A) 02/15/2021 0946   BILIRUBINUR NEGATIVE 02/15/2021 0946   BILIRUBINUR Negative 03/18/2013 1856   KETONESUR NEGATIVE 02/15/2021 0946   PROTEINUR 30 (A) 02/15/2021 0946   UROBILINOGEN 0.2 09/23/2012 0833   NITRITE NEGATIVE 02/15/2021 0946   LEUKOCYTESUR LARGE (A) 02/15/2021 0946   LEUKOCYTESUR Negative 03/18/2013 1856    Radiological Exams on Admission: 02/17/2021 PELVIC COMPLETE WITH TRANSVAGINAL  Result Date: 02/15/2021 CLINICAL DATA:  Left ovarian mass seen in the CT EXAM: TRANSABDOMINAL AND TRANSVAGINAL ULTRASOUND OF PELVIS DOPPLER ULTRASOUND OF OVARIES TECHNIQUE: Both transabdominal and transvaginal ultrasound examinations of the pelvis were performed. Transabdominal technique was performed for global imaging of the pelvis including uterus, ovaries, adnexal regions, and pelvic cul-de-sac.  It was necessary to proceed with endovaginal exam following the transabdominal exam to visualize the ovaries. Color and duplex Doppler ultrasound was utilized to evaluate blood flow to the ovaries. COMPARISON:  04/15/2018 FINDINGS: Uterus Measurements: 7.9 x 4 x 5.7 cm = volume: 93.2 mL. There is inhomogeneous echogenicity without discrete nodules. Endometrium Thickness: Endometrial stripe measures 11 mm. There are small hyperechoic foci in the endometrium within the fundus without definite acoustic shadowing. Right ovary Measurements: 3.5 x 2 x 2.2 = volume:  8 mL.  Unremarkable Left ovary Left ovary is not sonographically visualized and not evaluated. Color Doppler evaluation of right ovary demonstrates vascular flow. Other findings No abnormal free fluid. IMPRESSION: Endometrial stripe is prominent measuring 11 mm with questionable minimal calcifications. There is inhomogeneous echogenicity in myometrium without discrete nodules. Left ovary is not sonographically visualized and not  evaluated. Electronically Signed   By: Ernie Avena M.D.   On: 02/15/2021 12:30   US Abdomen Limited RUQ (LIVER/GB)  Result Date: 02/15/2021 CLINICAL DATA:  Right upper quadrant pain for 6 days. EXAM: ULTRASOUND ABDOMEN LIMITED RIGHT UPPER QUADRANT COMPARISON:  CT abdomen and pelvis 02/11/2021 FINDINGS: Gallbladder: No gallstones or wall thickening visualized. No sonographic Murphy sign noted by sonographer. Common bile duct: Diameter: 10 mm proximally with distal tapering Liver: Increased parenchymal echogenicity diffusely without a focal lesion identified. Portal vein is patent on color Doppler imaging with normal direction of blood flow towards the liver. Other: None. IMPRESSION: 1. Mildly dilated common bile duct which tapers distally. Recommend laboratory correlation. 2. Normal appearance of the gallbladder. 3. Echogenic liver, nonspecific though may reflect steatosis. Electronically Signed   By: Sebastian Ache M.D.   On: 02/15/2021 12:23    EKG: None.   Assessment/Plan Principal Problem:   Abdominal pain   Abdominal pain- lipase, liver enzymes WNL.  Has a mild leukocytosis of 13.  [facility] 1/[facility], shows asymmetric enlargement of the left ovary otherwise without acute intra-abdominal or pelvic pathology. RUQ ultrasound today shows mild dilated CBD .  Patient has a history of 34mm common bile duct stone seen on MRCP, she will subsequently underwent ERCP, sphincterotomy and stone removal in 2012.  She was advised to undergo cholecystectomy but this did not happen. -GI and general surgery consulted -N.p.o. midnight -IV morphine 2 mg as needed - 1 L bolus given, cont N/s + 20 KCL 100cc/hr x 1 day  Abnormal vaginal bleeding- ? secondary to physiologic stress.  Pregnancy test negative.  Pelvic ultrasound showing 11 mm endometrial stripe, left ovary not sonographically visualized or evaluated.  Prior CT showed asymmetric enlargement of left ovary. -If persistent vaginal bleed consider  GYN referral  Probable UTI-reports urinary frequency, denies dysuria.  Mild leukocytosis of 13.  Not septic.  UA with large leukocytes, 21-50 WBCs, no bacteria. -IV ceftriaxone 2 g daily -Follow-up urine cultures  Anemia-hemoglobin 10.4 today, likely 2/2 patient's menstrual cycle of 7 days.  Ferritin and iron panel already ordered.  DVT prophylaxis: SCDs Code Status: Full Family Communication: None at bedside Disposition Plan:  ~ 2 days Consults called: GI, gen surg Admission status: obs, med surg     Onnie Boer MD [facility]  02/15/2021, 6:09 PM

## 2021-02-16 ENCOUNTER — Encounter (HOSPITAL_COMMUNITY): Payer: Self-pay | Admitting: Internal Medicine

## 2021-02-16 ENCOUNTER — Other Ambulatory Visit: Payer: Self-pay

## 2021-02-16 ENCOUNTER — Observation Stay (HOSPITAL_COMMUNITY): Payer: Medicaid Other | Admitting: Certified Registered"

## 2021-02-16 ENCOUNTER — Encounter (HOSPITAL_COMMUNITY)
Admission: EM | Disposition: A | Discharge status: Home / Self Care | Payer: Self-pay | Source: Home / Self Care | Attending: Family Medicine

## 2021-02-16 DIAGNOSIS — N390 Urinary tract infection, site not specified: Secondary | ICD-10-CM | POA: Diagnosis present

## 2021-02-16 DIAGNOSIS — N838 Other noninflammatory disorders of ovary, fallopian tube and broad ligament: Secondary | ICD-10-CM | POA: Diagnosis present

## 2021-02-16 DIAGNOSIS — Z833 Family history of diabetes mellitus: Secondary | ICD-10-CM | POA: Diagnosis not present

## 2021-02-16 DIAGNOSIS — K8012 Calculus of gallbladder with acute and chronic cholecystitis without obstruction: Secondary | ICD-10-CM | POA: Diagnosis present

## 2021-02-16 DIAGNOSIS — K811 Chronic cholecystitis: Secondary | ICD-10-CM

## 2021-02-16 DIAGNOSIS — Z825 Family history of asthma and other chronic lower respiratory diseases: Secondary | ICD-10-CM | POA: Diagnosis not present

## 2021-02-16 DIAGNOSIS — Z8249 Family history of ischemic heart disease and other diseases of the circulatory system: Secondary | ICD-10-CM | POA: Diagnosis not present

## 2021-02-16 DIAGNOSIS — Z6841 Body Mass Index (BMI) 40.0 and over, adult: Secondary | ICD-10-CM | POA: Diagnosis not present

## 2021-02-16 DIAGNOSIS — Z91013 Allergy to seafood: Secondary | ICD-10-CM | POA: Diagnosis not present

## 2021-02-16 DIAGNOSIS — D649 Anemia, unspecified: Secondary | ICD-10-CM | POA: Diagnosis present

## 2021-02-16 DIAGNOSIS — F319 Bipolar disorder, unspecified: Secondary | ICD-10-CM | POA: Diagnosis present

## 2021-02-16 DIAGNOSIS — K810 Acute cholecystitis: Secondary | ICD-10-CM | POA: Diagnosis not present

## 2021-02-16 DIAGNOSIS — B951 Streptococcus, group B, as the cause of diseases classified elsewhere: Secondary | ICD-10-CM | POA: Diagnosis present

## 2021-02-16 DIAGNOSIS — R1011 Right upper quadrant pain: Secondary | ICD-10-CM | POA: Diagnosis present

## 2021-02-16 DIAGNOSIS — F17290 Nicotine dependence, other tobacco product, uncomplicated: Secondary | ICD-10-CM | POA: Diagnosis present

## 2021-02-16 HISTORY — PX: CHOLECYSTECTOMY: SHX55

## 2021-02-16 LAB — COMPREHENSIVE METABOLIC PANEL
ALT: 16 U/L (ref 0–44)
AST: 16 U/L (ref 15–41)
Albumin: 3.3 g/dL — ABNORMAL LOW (ref 3.5–5.0)
Alkaline Phosphatase: 87 U/L (ref 38–126)
Anion gap: 10 (ref 5–15)
BUN: 7 mg/dL (ref 6–20)
CO2: 26 mmol/L (ref 22–32)
Calcium: 9.1 mg/dL (ref 8.9–10.3)
Chloride: 103 mmol/L (ref 98–111)
Creatinine, Ser: 0.63 mg/dL (ref 0.44–1.00)
GFR, Estimated: >60 mL/min (ref >60)
Glucose, Bld: 77 mg/dL (ref 70–99)
Potassium: 3.7 mmol/L (ref 3.5–5.1)
Sodium: 139 mmol/L (ref 135–145)
Total Bilirubin: 0.2 mg/dL — ABNORMAL LOW (ref 0.3–1.2)
Total Protein: 7.1 g/dL (ref 6.5–8.1)

## 2021-02-16 LAB — CBC
HCT: 31.6 % — ABNORMAL LOW (ref 36.0–46.0)
Hemoglobin: 10.1 g/dL — ABNORMAL LOW (ref 12.0–15.0)
MCH: 26.9 pg (ref 26.0–34.0)
MCHC: 32 g/dL (ref 30.0–36.0)
MCV: 84 fL (ref 80.0–100.0)
Platelets: 572 10*3/uL — ABNORMAL HIGH (ref 150–400)
RBC: 3.76 MIL/uL — ABNORMAL LOW (ref 3.87–5.11)
RDW: 14.5 % (ref 11.5–15.5)
WBC: 11.1 10*3/uL — ABNORMAL HIGH (ref 4.0–10.5)
nRBC: 0 % (ref 0.0–0.2)

## 2021-02-16 LAB — SURGICAL PCR SCREEN
MRSA, PCR: NEGATIVE
Staphylococcus aureus: NEGATIVE

## 2021-02-16 LAB — HIV ANTIBODY (ROUTINE TESTING W REFLEX): HIV Screen 4th Generation wRfx: NONREACTIVE

## 2021-02-16 SURGERY — LAPAROSCOPIC CHOLECYSTECTOMY
Anesthesia: General | Site: Abdomen

## 2021-02-16 MED ORDER — FENTANYL CITRATE PF 50 MCG/ML IJ SOSY: 25.0000 ug | PREFILLED_SYRINGE | INTRAMUSCULAR | Status: DC | PRN

## 2021-02-16 MED ORDER — CHLORHEXIDINE GLUCONATE CLOTH 2 % EX PADS: 6.0000 | MEDICATED_PAD | Freq: Once | CUTANEOUS | Status: DC

## 2021-02-16 MED ORDER — LIDOCAINE HCL (PF) 2 % IJ SOLN
INTRAMUSCULAR | Status: AC
  Filled 2021-02-16: qty 10

## 2021-02-16 MED ORDER — HYDROMORPHONE HCL 1 MG/ML IJ SOLN
0.5000 mg | INTRAMUSCULAR | Status: DC | PRN
  Administered 2021-02-16 – 2021-02-17 (×4): 0.5 mg via INTRAVENOUS
  Filled 2021-02-16 (×4): qty 0.5

## 2021-02-16 MED ORDER — MIDAZOLAM HCL 5 MG/5ML IJ SOLN
INTRAMUSCULAR | Status: DC | PRN
  Administered 2021-02-16: 2 mg via INTRAVENOUS

## 2021-02-16 MED ORDER — FENTANYL CITRATE (PF) 250 MCG/5ML IJ SOLN
INTRAMUSCULAR | Status: DC | PRN
  Administered 2021-02-16: 100 ug via INTRAVENOUS
  Administered 2021-02-16: 50 ug via INTRAVENOUS

## 2021-02-16 MED ORDER — LIDOCAINE 2% (20 MG/ML) 5 ML SYRINGE
INTRAMUSCULAR | Status: DC | PRN
  Administered 2021-02-16: 100 mg via INTRAVENOUS

## 2021-02-16 MED ORDER — SODIUM CHLORIDE 0.9 % IR SOLN
Status: DC | PRN
  Administered 2021-02-16: 1000 mL

## 2021-02-16 MED ORDER — ONDANSETRON HCL 4 MG/2ML IJ SOLN
INTRAMUSCULAR | Status: DC | PRN
  Administered 2021-02-16: 4 mg via INTRAVENOUS

## 2021-02-16 MED ORDER — HEMOSTATIC AGENTS (NO CHARGE) OPTIME
TOPICAL | Status: DC | PRN
  Administered 2021-02-16: 1 via TOPICAL

## 2021-02-16 MED ORDER — SUGAMMADEX SODIUM 200 MG/2ML IV SOLN
INTRAVENOUS | Status: DC | PRN
  Administered 2021-02-16: 200 mg via INTRAVENOUS

## 2021-02-16 MED ORDER — MIDAZOLAM HCL 2 MG/2ML IJ SOLN
INTRAMUSCULAR | Status: AC
  Filled 2021-02-16: qty 2

## 2021-02-16 MED ORDER — SODIUM CHLORIDE 0.9 % IV SOLN
2.0000 g | INTRAVENOUS | Status: DC
  Administered 2021-02-17: 2 g via INTRAVENOUS
  Filled 2021-02-16: qty 20

## 2021-02-16 MED ORDER — BUPIVACAINE HCL (PF) 0.5 % IJ SOLN
INTRAMUSCULAR | Status: AC
  Filled 2021-02-16: qty 30

## 2021-02-16 MED ORDER — ONDANSETRON HCL 4 MG/2ML IJ SOLN
INTRAMUSCULAR | Status: AC
  Filled 2021-02-16: qty 2

## 2021-02-16 MED ORDER — DEXAMETHASONE SODIUM PHOSPHATE 10 MG/ML IJ SOLN
INTRAMUSCULAR | Status: DC | PRN
  Administered 2021-02-16: 5 mg via INTRAVENOUS

## 2021-02-16 MED ORDER — DEXMEDETOMIDINE (PRECEDEX) IN NS 20 MCG/5ML (4 MCG/ML) IV SYRINGE
PREFILLED_SYRINGE | INTRAVENOUS | Status: DC | PRN
  Administered 2021-02-16: 8 ug via INTRAVENOUS

## 2021-02-16 MED ORDER — SODIUM CHLORIDE 0.9 % IV SOLN: INTRAVENOUS | Status: DC

## 2021-02-16 MED ORDER — ONDANSETRON HCL 4 MG/2ML IJ SOLN: 4.0000 mg | Freq: Once | INTRAMUSCULAR | Status: DC | PRN

## 2021-02-16 MED ORDER — SODIUM CHLORIDE 0.9 % IV SOLN: 2.0000 g | INTRAVENOUS | Status: DC

## 2021-02-16 MED ORDER — PROPOFOL 10 MG/ML IV BOLUS
INTRAVENOUS | Status: DC | PRN
  Administered 2021-02-16: 200 mg via INTRAVENOUS

## 2021-02-16 MED ORDER — CHLORHEXIDINE GLUCONATE CLOTH 2 % EX PADS
6.0000 | MEDICATED_PAD | Freq: Once | CUTANEOUS | Status: DC
Start: 2021-02-16 — End: 2021-02-16

## 2021-02-16 MED ORDER — CHLORHEXIDINE GLUCONATE 0.12 % MT SOLN
15.0000 mL | Freq: Once | OROMUCOSAL | Status: AC
  Administered 2021-02-16: 15 mL via OROMUCOSAL

## 2021-02-16 MED ORDER — FENTANYL CITRATE PF 50 MCG/ML IJ SOSY
25.0000 ug | PREFILLED_SYRINGE | INTRAMUSCULAR | Status: DC | PRN
  Administered 2021-02-16 (×2): 50 ug via INTRAVENOUS
  Filled 2021-02-16: qty 1

## 2021-02-16 MED ORDER — BUPIVACAINE HCL (PF) 0.5 % IJ SOLN
INTRAMUSCULAR | Status: DC | PRN
  Administered 2021-02-16: 15 mL

## 2021-02-16 MED ORDER — SODIUM CHLORIDE 0.9 % IV SOLN
2.0000 g | INTRAVENOUS | Status: AC
  Administered 2021-02-16: 2 g via INTRAVENOUS
  Filled 2021-02-16: qty 2

## 2021-02-16 MED ORDER — FENTANYL CITRATE PF 50 MCG/ML IJ SOSY
PREFILLED_SYRINGE | INTRAMUSCULAR | Status: AC
  Filled 2021-02-16: qty 1

## 2021-02-16 MED ORDER — ORAL CARE MOUTH RINSE: 15.0000 mL | Freq: Once | OROMUCOSAL | Status: AC

## 2021-02-16 MED ORDER — OXYCODONE HCL 5 MG PO TABS
5.0000 mg | ORAL_TABLET | Freq: Four times a day (QID) | ORAL | Status: DC | PRN
  Administered 2021-02-16 – 2021-02-17 (×2): 5 mg via ORAL
  Filled 2021-02-16 (×2): qty 1

## 2021-02-16 MED ORDER — ROCURONIUM BROMIDE 10 MG/ML (PF) SYRINGE
PREFILLED_SYRINGE | INTRAVENOUS | Status: DC | PRN
  Administered 2021-02-16: 10 mg via INTRAVENOUS
  Administered 2021-02-16: 70 mg via INTRAVENOUS

## 2021-02-16 MED ORDER — ACETAMINOPHEN 500 MG PO TABS
1000.0000 mg | ORAL_TABLET | Freq: Four times a day (QID) | ORAL | Status: DC
  Administered 2021-02-16 – 2021-02-17 (×4): 1000 mg via ORAL
  Filled 2021-02-16 (×4): qty 2

## 2021-02-16 MED ORDER — PROPOFOL 10 MG/ML IV BOLUS
INTRAVENOUS | Status: AC
  Filled 2021-02-16: qty 20

## 2021-02-16 MED ORDER — LACTATED RINGERS IV SOLN: INTRAVENOUS | Status: DC

## 2021-02-16 MED ORDER — FENTANYL CITRATE (PF) 250 MCG/5ML IJ SOLN
INTRAMUSCULAR | Status: AC
  Filled 2021-02-16: qty 5

## 2021-02-16 MED ORDER — ROCURONIUM BROMIDE 10 MG/ML (PF) SYRINGE
PREFILLED_SYRINGE | INTRAVENOUS | Status: AC
  Filled 2021-02-16: qty 10

## 2021-02-16 SURGICAL SUPPLY — 38 items
APPLIER CLIP ROT 10 11.4 M/L (STAPLE) ×2
BAG RETRIEVAL 10 (BASKET) ×1
CHLORAPREP W/TINT 26 (MISCELLANEOUS) ×2 IMPLANT
CLIP APPLIE ROT 10 11.4 M/L (STAPLE) ×1 IMPLANT
CLOTH BEACON ORANGE TIMEOUT ST (SAFETY) ×2 IMPLANT
COVER LIGHT HANDLE STERIS (MISCELLANEOUS) ×4 IMPLANT
DERMABOND ADVANCED (GAUZE/BANDAGES/DRESSINGS) ×1
DERMABOND ADVANCED .7 DNX12 (GAUZE/BANDAGES/DRESSINGS) ×1 IMPLANT
ELECT REM PT RETURN 9FT ADLT (ELECTROSURGICAL) ×2
ELECTRODE REM PT RTRN 9FT ADLT (ELECTROSURGICAL) ×1 IMPLANT
GAUZE 4X4 16PLY ~~LOC~~+RFID DBL (SPONGE) ×2 IMPLANT
GLOVE SURG POLYISO LF SZ6.5 (GLOVE) ×2 IMPLANT
GLOVE SURG POLYISO LF SZ7 (GLOVE) ×2 IMPLANT
GLOVE SURG UNDER POLY LF SZ6.5 (GLOVE) ×1 IMPLANT
GLOVE SURG UNDER POLY LF SZ7 (GLOVE) ×8 IMPLANT
GOWN STRL REUS W/TWL LRG LVL3 (GOWN DISPOSABLE) ×7 IMPLANT
HEMOSTAT SNOW SURGICEL 2X4 (HEMOSTASIS) ×2 IMPLANT
INST SET LAPROSCOPIC AP (KITS) ×2 IMPLANT
KIT TURNOVER KIT A (KITS) ×2 IMPLANT
MANIFOLD NEPTUNE II (INSTRUMENTS) ×2 IMPLANT
NDL INSUFFLATION 14GA 120MM (NEEDLE) ×1 IMPLANT
NEEDLE INSUFFLATION 14GA 120MM (NEEDLE) ×2 IMPLANT
NS IRRIG 1000ML POUR BTL (IV SOLUTION) ×2 IMPLANT
PACK LAP CHOLE LZT030E (CUSTOM PROCEDURE TRAY) ×2 IMPLANT
PAD ARMBOARD 7.5X6 YLW CONV (MISCELLANEOUS) ×2 IMPLANT
PENCIL HANDSWITCHING (ELECTRODE) ×1 IMPLANT
SET BASIN LINEN APH (SET/KITS/TRAYS/PACK) ×2 IMPLANT
SET TUBE SMOKE EVAC HIGH FLOW (TUBING) ×2 IMPLANT
SLEEVE ENDOPATH XCEL 5M (ENDOMECHANICALS) ×2 IMPLANT
SUT MNCRL AB 4-0 PS2 18 (SUTURE) ×4 IMPLANT
SUT VICRYL 0 UR6 27IN ABS (SUTURE) ×2 IMPLANT
SYS BAG RETRIEVAL 10MM (BASKET) ×1
SYSTEM BAG RETRIEVAL 10MM (BASKET) ×1 IMPLANT
TROCAR ENDO BLADELESS 11MM (ENDOMECHANICALS) ×2 IMPLANT
TROCAR XCEL NON-BLD 5MMX100MML (ENDOMECHANICALS) ×2 IMPLANT
TROCAR XCEL UNIV SLVE 11M 100M (ENDOMECHANICALS) ×2 IMPLANT
TUBE CONNECTING 12X1/4 (SUCTIONS) ×2 IMPLANT
WARMER LAPAROSCOPE (MISCELLANEOUS) ×2 IMPLANT

## 2021-02-16 NOTE — Progress Notes (Signed)
PROGRESS NOTE     Gina Hurley, is a 31 y.o. female, DOB - [birth year], [id number]  Admit date - 02/15/2021   Admitting Physician Destane Speas Denton Brick, MD  Outpatient Primary MD for the patient is Lorelee Market, MD  LOS - 0  Chief Complaint  Patient presents with   Abdominal Pain        Brief Narrative:  31 y.o. female with medical history significant for bipolar disorder admitted on 02/16/2020 with acute cholecystitis, status post lap chole on 02/16/2021 -General surgery recommends against discharge at this time pending tolerance of oral intake , improvement in lab data and better pain control  Assessment & Plan:   Principal Problem:   Abdominal pain Active Problems:   RUQ abdominal pain   Chronic cholecystitis   Acute cholecystitis   1) status post lap chole--02/16/21 -Status post prior  ERCP, sphincterotomy and stone removal in 2012. -General surgery recommends against discharge at this time pending tolerance of oral intake , improvement in lab data and better pain control -Recheck LFTs in the a.m. -Continue IV fluids until tolerating oral intake better  2)Vaginal bleeding--suspect dysfunctional uterine bleeding -CT abdomen and pelvic ultrasound report noted -Outpatient follow-up with GYN if symptoms persist advised  3)Anemia--acute versus acute on chronic patient with vaginal bleeding and status post lap chole -Monitor closely  4) possible UTI--continue IV Rocephin pending urine culture data  5)Morbid Obesity- -Low calorie diet, portion control and increase physical activity discussed with patient -Body mass index is 40.77 kg/m.   Disposition/Need for in-Hospital Stay- patient unable to be discharged at this time due to -requiring IV antibiotics and pain control pending further culture data awaiting tolerance of oral intake.  Status is: Inpatient  Remains inpatient appropriate because:   Disposition: The patient is from: Home              Anticipated  d/c is to: Home              Anticipated d/c date is: 1 day              Patient currently is not medically stable to d/c. Barriers: Not Clinically Stable-   Code Status :  -  Code Status: Full Code   Family Communication:    NA (patient is alert, awake and coherent)   Consults  :  Gen surg  DVT Prophylaxis  :   - SCDs   SCDs Start: 02/15/21 1807    Lab Results  Component Value Date   PLT 572 (H) 02/16/2021    Inpatient Medications  Scheduled Meds:  acetaminophen  1,000 mg Oral Q6H   feeding supplement  1 Container Oral TID BM   Continuous Infusions:  [START ON 02/17/2021] cefTRIAXone (ROCEPHIN)  IV     PRN Meds:.HYDROmorphone (DILAUDID) injection, ondansetron **OR** ondansetron (ZOFRAN) IV, oxyCODONE   Anti-infectives (From admission, onward)    Start     Dose/Rate Route Frequency Ordered Stop   02/17/21 1000  cefTRIAXone (ROCEPHIN) 2 g in sodium chloride 0.9 % 100 mL IVPB  Status:  Discontinued        2 g 200 mL/hr over 30 Minutes Intravenous Every 24 hours 02/16/21 1408 02/16/21 1409   02/17/21 0000  cefTRIAXone (ROCEPHIN) 2 g in sodium chloride 0.9 % 100 mL IVPB  Status:  Discontinued        2 g 200 mL/hr over 30 Minutes Intravenous Every 24 hours 02/16/21 1406 02/16/21 1408   02/17/21 0000  cefTRIAXone (ROCEPHIN) 2 g in sodium  chloride 0.9 % 100 mL IVPB        2 g 200 mL/hr over 30 Minutes Intravenous Every 24 hours 02/16/21 1409     02/16/21 0915  cefoTEtan (CEFOTAN) 2 g in sodium chloride 0.9 % 100 mL IVPB        2 g 200 mL/hr over 30 Minutes Intravenous On call to O.R. 02/16/21 0819 02/16/21 1145   02/15/21 1815  cefTRIAXone (ROCEPHIN) 2 g in sodium chloride 0.9 % 100 mL IVPB  Status:  Discontinued        2 g 200 mL/hr over 30 Minutes Intravenous Daily 02/15/21 1808 02/16/21 [id number]         Subjective: Gina Hurley today has no fevers,  No chest pain,  - Abdominal pain persist, nausea but no emesis -Having difficulty with oral intake at this time  denies   Objective: Vitals:   02/16/21 1345 02/16/21 1358 02/16/21 1412 02/16/21 1812  BP: (!) 109/59 125/76 123/69 135/85  Pulse: 74 67 70 70  Resp: 15 16 20 20   Temp:  97.9 F (36.6 C) 97.7 F (36.5 C) 97.9 F (36.6 C)  TempSrc:  Oral Oral Oral  SpO2: 97% 96% 96% 96%  Weight:      Height:        Intake/Output Summary (Last 24 hours) at 02/16/2021 1815 Last data filed at 02/16/2021 1303 Gross per 24 hour  Intake 1956.63 ml  Output 10 ml  Net 1946.63 ml   Filed Weights   02/15/21 1802 02/16/21 0841 02/16/21 0850  Weight: 111.1 kg 111.1 kg 111.1 kg     Physical Exam  Gen:- Awake Alert, morbidly obese, no acute distress HEENT:- [place].AT, No sclera icterus Neck-Supple Neck,No JVD,.  Lungs-  CTAB , fair symmetrical air movement CV- S1, S2 normal, regular  Abd-  +ve B.Sounds, Abd Soft, appropriate postoperative tenderness,    Extremity/Skin:- No  edema, pedal pulses present  Psych-affect is appropriate, oriented x3 Neuro-no new focal deficits, no tremors  Data Reviewed: I have personally reviewed following labs and imaging studies  CBC: Recent Labs  Lab 02/13/21 0044 02/15/21 0958 02/16/21 0424  WBC 10.6* 13.0* 11.1*  NEUTROABS 7.0 9.4*  --   HGB 9.7* 10.4* 10.1*  HCT 30.6* 31.7* 31.6*  MCV 84.1 83.9 84.0  PLT 465* 521* 0000000*   Basic Metabolic Panel: Recent Labs  Lab 02/13/21 0044 02/15/21 0958 02/16/21 0424  NA 135 137 139  K 3.8 3.6 3.7  CL 106 104 103  CO2 22 26 26   GLUCOSE 95 108* 77  BUN 10 9 7   CREATININE 0.64 0.66 0.63  CALCIUM 8.6* 9.0 9.1   GFR: Estimated Creatinine Clearance: 128.7 mL/min (by C-G formula based on SCr of 0.63 mg/dL). Liver Function Tests: Recent Labs  Lab 02/13/21 0044 02/15/21 0958 02/16/21 0424  AST 22 17   15 16   ALT 21 18   17 16   ALKPHOS 84 89   87 87  BILITOT 0.1* 0.2*   0.1* 0.2*  PROT 7.3 7.3   7.3 7.1  ALBUMIN 3.5 3.4*   3.5 3.3*   Recent Labs  Lab 02/13/21 0044 02/15/21 0958  LIPASE 27 24   No  results for input(s): AMMONIA in the last 168 hours. Coagulation Profile: No results for input(s): INR, PROTIME in the last 168 hours. Cardiac Enzymes: No results for input(s): CKTOTAL, CKMB, CKMBINDEX, TROPONINI in the last 168 hours. BNP (last 3 results) No results for input(s): PROBNP in the last 8760 hours. HbA1C:  No results for input(s): HGBA1C in the last 72 hours. CBG: No results for input(s): GLUCAP in the last 168 hours. Lipid Profile: No results for input(s): CHOL, HDL, LDLCALC, TRIG, CHOLHDL, LDLDIRECT in the last 72 hours. Thyroid Function Tests: No results for input(s): TSH, T4TOTAL, FREET4, T3FREE, THYROIDAB in the last 72 hours. Anemia Panel: Recent Labs    02/15/21 0958  FERRITIN 18  TIBC 386  IRON 13*   Urine analysis:    Component Value Date/Time   COLORURINE YELLOW 02/15/2021 0946   APPEARANCEUR CLOUDY (A) 02/15/2021 0946   APPEARANCEUR Clear 03/18/2013 1856   LABSPEC 1.021 02/15/2021 0946   LABSPEC 1.025 03/18/2013 1856   PHURINE 6.0 02/15/2021 0946   GLUCOSEU NEGATIVE 02/15/2021 0946   GLUCOSEU Negative 03/18/2013 1856   HGBUR LARGE (A) 02/15/2021 0946   BILIRUBINUR NEGATIVE 02/15/2021 0946   BILIRUBINUR Negative 03/18/2013 1856   KETONESUR NEGATIVE 02/15/2021 0946   PROTEINUR 30 (A) 02/15/2021 0946   UROBILINOGEN 0.2 09/23/2012 0833   NITRITE NEGATIVE 02/15/2021 0946   LEUKOCYTESUR LARGE (A) 02/15/2021 0946   LEUKOCYTESUR Negative 03/18/2013 1856   Sepsis Labs: @LABRCNTIP (procalcitonin:4,lacticidven:4)  ) Recent Results (from the past 240 hour(s))  Surgical pcr screen     Status: None   Collection Time: 02/16/21  4:31 AM   Specimen: Nasal Mucosa; Nasal Swab  Result Value Ref Range Status   MRSA, PCR NEGATIVE NEGATIVE Final   Staphylococcus aureus NEGATIVE NEGATIVE Final    Comment: (NOTE) The Xpert SA Assay (FDA approved for NASAL specimens in patients 57 years of age and older), is one component of a comprehensive surveillance program.  It is not intended to diagnose infection nor to guide or monitor treatment. Performed at [facility], [address] [place], [place] [address]       Radiology Studies: US PELVIC COMPLETE WITH TRANSVAGINAL  Result Date: 02/15/2021 CLINICAL DATA:  Left ovarian mass seen in the CT EXAM: TRANSABDOMINAL AND TRANSVAGINAL ULTRASOUND OF PELVIS DOPPLER ULTRASOUND OF OVARIES TECHNIQUE: Both transabdominal and transvaginal ultrasound examinations of the pelvis were performed. Transabdominal technique was performed for global imaging of the pelvis including uterus, ovaries, adnexal regions, and pelvic cul-de-sac. It was necessary to proceed with endovaginal exam following the transabdominal exam to visualize the ovaries. Color and duplex Doppler ultrasound was utilized to evaluate blood flow to the ovaries. COMPARISON:  04/15/2018 FINDINGS: Uterus Measurements: 7.9 x 4 x 5.7 cm = volume: 93.2 mL. There is inhomogeneous echogenicity without discrete nodules. Endometrium Thickness: Endometrial stripe measures 11 mm. There are small hyperechoic foci in the endometrium within the fundus without definite acoustic shadowing. Right ovary Measurements: 3.5 x 2 x 2.2 = volume:  8 mL.  Unremarkable Left ovary Left ovary is not sonographically visualized and not evaluated. Color Doppler evaluation of right ovary demonstrates vascular flow. Other findings No abnormal free fluid. IMPRESSION: Endometrial stripe is prominent measuring 11 mm with questionable minimal calcifications. There is inhomogeneous echogenicity in myometrium without discrete nodules. Left ovary is not sonographically visualized and not evaluated. Electronically Signed   By: Elmer Picker M.D.   On: 02/15/2021 12:30   US Abdomen Limited RUQ (LIVER/GB)  Result Date: 02/15/2021 CLINICAL DATA:  Right upper quadrant pain for 6 days. EXAM: ULTRASOUND ABDOMEN LIMITED RIGHT UPPER QUADRANT COMPARISON:  CT abdomen and pelvis 02/11/2021 FINDINGS: Gallbladder:  No gallstones or wall thickening visualized. No sonographic Murphy sign noted by sonographer. Common bile duct: Diameter: 10 mm proximally with distal tapering Liver: Increased parenchymal echogenicity diffusely without a focal  lesion identified. Portal vein is patent on color Doppler imaging with normal direction of blood flow towards the liver. Other: None. IMPRESSION: 1. Mildly dilated common bile duct which tapers distally. Recommend laboratory correlation. 2. Normal appearance of the gallbladder. 3. Echogenic liver, nonspecific though may reflect steatosis. Electronically Signed   By: Logan Bores M.D.   On: 02/15/2021 12:23     Scheduled Meds:  acetaminophen  1,000 mg Oral Q6H   feeding supplement  1 Container Oral TID BM   Continuous Infusions:  [START ON 02/17/2021] cefTRIAXone (ROCEPHIN)  IV       LOS: 0 days    Roxan Hockey M.D on 02/16/2021 at 6:15 PM  Go to [email or web address] - for contact info  [facility] - Office  [phone]  If 7PM-7AM, please contact night-coverage [email or web address] Password [facility] 02/16/2021, 6:15 PM

## 2021-02-16 NOTE — Progress Notes (Signed)
°  Transition of Care [facility]) Screening Note   Patient Details  Name: Gina Hurley Date of Birth: 12/23/1990   Transition of Care [facility]) CM/SW Contact:    Leitha Bleak, RN Phone Number: 02/16/2021, 11:54 AM  OR today-   Transition of Care Department [facility]) has reviewed patient and no TOC needs have been identified at this time. We will continue to monitor patient advancement through interdisciplinary progression rounds. If new patient transition needs arise, please place a TOC consult.

## 2021-02-16 NOTE — Progress Notes (Incomplete)
Initial Nutrition Assessment  DOCUMENTATION CODES:      INTERVENTION:     NUTRITION DIAGNOSIS:     related to   as evidenced by  .    GOAL:      MONITOR:     REASON FOR ASSESSMENT:   Malnutrition Screening Tool    ASSESSMENT: Patient is an obese 31 yo with hx of bipolar       Labs: BMP Latest Ref Rng & Units 02/16/2021 02/15/2021 02/13/2021  Glucose 70 - 99 mg/dL 77 400(Q) 95  BUN 6 - 20 mg/dL 7 9 10   Creatinine 0.44 - 1.00 mg/dL 6.76 1.95  Sodium 135 - 145 mmol/L 139 137 135  Potassium 3.5 - 5.1 mmol/L 3.7 3.6 3.8  Chloride 98 - 111 mmol/L 103 104 106  CO2 22 - 32 mmol/L 26 26 22   Calcium 8.9 - 10.3 mg/dL 9.1 9.0 0.93)      NUTRITION - FOCUSED PHYSICAL EXAM:  {RD Focused Exam List:21252}  Diet Order:   Diet Order             Diet regular Room service appropriate? Yes; Fluid consistency: Thin  Diet effective now                   EDUCATION NEEDS:     Skin:     Last BM:     Height:   Ht Readings from Last 1 Encounters:  02/16/21 5\' 5"  (1.651 m)    Weight:   Wt Readings from Last 1 Encounters:  02/16/21 111.1 kg    Ideal Body Weight:   57 kg  BMI:  Body mass index is 40.77 kg/m.  Estimated Nutritional Needs:   Kcal:     Protein:     Fluid:     02/18/21 MS,RD,CSG,LDN Contact: AMION

## 2021-02-16 NOTE — Progress Notes (Signed)
Update note:  Called patient's father, Gina Hurley, to update him after completion of her surgery.  It was explained that her gallbladder was able to be removed without issue.  It was further explained that we plan to keep her in the hospital for 1 additional day to make sure all of her blood work is improving tomorrow.  All of Gina Hurley questions were answered to Gina Hurley expressed satisfaction.  Plan: -Transfer back to floor -Regular, heart healthy diet -Repeat blood work tomorrow morning -PRN pain control -Hopefully can be discharged home tomorrow  Theophilus Kinds, DO [facility] [address] [place], [place] [address] [phone] (office)

## 2021-02-16 NOTE — Transfer of Care (Signed)
Immediate Anesthesia Transfer of Care Note  Patient: MERCIA DOWE  Procedure(s) Performed: LAPAROSCOPIC CHOLECYSTECTOMY (Abdomen)  Patient Location: PACU  Anesthesia Type:General  Level of Consciousness: sedated, patient cooperative and responds to stimulation  Airway & Oxygen Therapy: Patient Spontanous Breathing and Patient connected to face mask oxygen  Post-op Assessment: Report given to RN, Post -op Vital signs reviewed and stable and Patient moving all extremities  Post vital signs: Reviewed and stable  Last Vitals:  Vitals Value Taken Time  BP    Temp    Pulse 85 02/16/21 1303  Resp 21 02/16/21 1303  SpO2 99 % 02/16/21 1303  Vitals shown include unvalidated device data.  Last Pain:  Vitals:   02/16/21 0841  TempSrc: Oral  PainSc: 6       Patients Stated Pain Goal: 10 (02/16/21 0841)  Complications: No notable events documented.

## 2021-02-16 NOTE — Anesthesia Postprocedure Evaluation (Signed)
Anesthesia Post Note  Patient: Gina Hurley  Procedure(s) Performed: LAPAROSCOPIC CHOLECYSTECTOMY (Abdomen)  Patient location during evaluation: Phase II Anesthesia Type: General Level of consciousness: awake Pain management: pain level controlled Vital Signs Assessment: post-procedure vital signs reviewed and stable Respiratory status: spontaneous breathing and respiratory function stable Cardiovascular status: blood pressure returned to baseline and stable Postop Assessment: no headache and no apparent nausea or vomiting Anesthetic complications: no Comments: Late entry   No notable events documented.   Last Vitals:  Vitals:   02/16/21 1358 02/16/21 1412  BP: 125/76 123/69  Pulse: 67 70  Resp: 16 20  Temp: 36.6 C 36.5 C  SpO2: 96% 96%    Last Pain:  Vitals:   02/16/21 1444  TempSrc:   PainSc: Asleep                 Windell Norfolk

## 2021-02-16 NOTE — Anesthesia Procedure Notes (Signed)
Procedure Name: Intubation Date/Time: 02/16/2021 11:36 AM Performed by: Myna Bright, CRNA Pre-anesthesia Checklist: Patient identified, Emergency Drugs available, Suction available and Patient being monitored Patient Re-evaluated:Patient Re-evaluated prior to induction Oxygen Delivery Method: Circle system utilized Preoxygenation: Pre-oxygenation with 100% oxygen Induction Type: IV induction Ventilation: Mask ventilation without difficulty Laryngoscope Size: Mac and 3 Grade View: Grade I Tube type: Oral Tube size: 7.0 mm Number of attempts: 1 Airway Equipment and Method: Stylet Placement Confirmation: ETT inserted through vocal cords under direct vision, positive ETCO2 and breath sounds checked- equal and bilateral Secured at: 21 cm Tube secured with: Tape Dental Injury: Teeth and Oropharynx as per pre-operative assessment

## 2021-02-16 NOTE — Anesthesia Preprocedure Evaluation (Signed)
Anesthesia Evaluation  Patient identified by MRN, date of birth, ID band Patient awake    Reviewed: Allergy & Precautions, H&P , NPO status , Patient's Chart, lab work & pertinent test results, reviewed documented beta blocker date and time   Airway Mallampati: II  TM Distance: >3 FB Neck ROM: full    Dental no notable dental hx.    Pulmonary neg pulmonary ROS, Current Smoker and Patient abstained from smoking.,    Pulmonary exam normal breath sounds clear to auscultation       Cardiovascular Exercise Tolerance: Good negative cardio ROS   Rhythm:regular Rate:Normal     Neuro/Psych PSYCHIATRIC DISORDERS Bipolar Disorder negative neurological ROS     GI/Hepatic Neg liver ROS, GERD  Medicated,  Endo/Other  Morbid obesity  Renal/GU negative Renal ROS  negative genitourinary   Musculoskeletal   Abdominal   Peds  Hematology negative hematology ROS (+)   Anesthesia Other Findings   Reproductive/Obstetrics negative OB ROS                             Anesthesia Physical Anesthesia Plan  ASA: 3  Anesthesia Plan: General and General ETT   Post-op Pain Management:    Induction:   PONV Risk Score and Plan: Ondansetron  Airway Management Planned:   Additional Equipment:   Intra-op Plan:   Post-operative Plan:   Informed Consent: I have reviewed the patients History and Physical, chart, labs and discussed the procedure including the risks, benefits and alternatives for the proposed anesthesia with the patient or authorized representative who has indicated his/her understanding and acceptance.     Dental Advisory Given  Plan Discussed with: CRNA  Anesthesia Plan Comments:         Anesthesia Quick Evaluation

## 2021-02-16 NOTE — Op Note (Signed)
Operative Note   Preoperative Diagnosis: Chronic cholecysitits   Postoperative Diagnosis: Same   Procedure(s) Performed: Laparoscopic cholecystectomy   Surgeon: Theophilus Kinds, DO    Assistants: Cecile Sheerer, RN   Anesthesia: General endotracheal   Anesthesiologist: Windell Norfolk, MD    Specimens: Gallbladder    Estimated Blood Loss: Minimal    Blood Replacement: None    Complications: None    Operative Findings: Minimally inflamed gallbladder  Indications: Patient is a 31 year old female who was admitted with concern for abdominal pain and concern for recurrent biliary symptoms.  In 2012 she was noted to have choledocholithiasis, and underwent ERCP with sphincterotomy for stone removal.  She was lost to follow-up for cholecystectomy.  She has a leukocytosis of 11.1.  All risks, benefits, and alternatives to above laparoscopic cholecystectomy were discussed with the patient, all of her questions were answered to her expressed satisfaction,. Patient expresses she wishes to proceed, and informed consent was obtained.  Procedure: The patient was taken to the operating room and placed supine. General endotracheal anesthesia was induced. Intravenous antibiotics were  administered per protocol. An orogastric tube positioned to decompress the stomach. The abdomen was prepared and draped in the usual sterile fashion.    A supraumbilical incision was made and a Veress technique was utilized to achieve pneumoperitoneum to 15 mmHg with carbon dioxide. A 11 mm optiview port was placed through the supraumbilical region, and a 10 mm 0-degree operative laparoscope was introduced. The area underlying the trocar and Veress needle were inspected and without evidence of injury.  Remaining trocars were placed under direct vision. Two 5 mm ports were placed in the right abdomen, between the anterior axillary and midclavicular line.  A final 11 mm port was placed through the mid-epigastrium, near  the falciform ligament.    The gallbladder fundus was elevated cephalad and the infundibulum was retracted to the patient's right. The gallbladder/cystic duct junction was skeletonized. The cystic artery noted in the triangle of Calot and was also skeletonized.  We then continued liberal medial and lateral dissection until the critical view of safety was achieved. A small posterior arterial branch was noted   The cystic duct, cystic artery, and small posterior arterial branch were doubly clipped and divided. The gallbladder was then dissected from the liver bed with electrocautery. The specimen was placed in an Endopouch and was retrieved through the epigastric site.   Final inspection revealed acceptable hemostasis. Surgical SNOW was placed in the gallbladder bed.  Trocars were removed and pneumoperitoneum was released.  0 Vicryl fascial sutures were used to close the epigastric and umbilical port sites. Skin incisions were closed with 4-0 Monocryl subcuticular sutures and Dermabond. The patient was awakened from anesthesia and extubated without complication.    Theophilus Kinds, DO  [facility] [address] [place], [place] [address] [phone] (office)

## 2021-02-16 NOTE — Consult Note (Signed)
[facility] Consult  Reason for Consult: History of choledocholithiasis, likely chronic cholecystitis Referring Physician: Lewie Loron, NP  Chief Complaint   Abdominal Pain     HPI: Gina Hurley is a 31 y.o. female who presents with a 7-day history of right upper quadrant and epigastric tenderness abdominal pain.  She has had pain like this in the past, and at that time, she was noted to have choledocholithiasis on MRCP and required ERCP.  At that time, the patient was pregnant, so she was recommended to follow-up after giving birth for her cholecystectomy.  She was lost to follow-up at that time.  She now presents with similar symptoms.  She describes her pain as a sharp pain in her right upper quadrant that is somewhat constant.  Food does seem to bring the pain on, though the patient states that she has modified her diet to try and decrease these episodes.  She does confirm some nausea without episodes of emesis.  She is passing flatus, and her last bowel movement was several days ago, which she attributes to not having eaten much this week.  In the ED, she underwent abdominal ultrasound, which showed a normal gallbladder without evidence of stones, and the common bile duct appeared to taper off to be 10 mm and have a tapering distally.  Per GI, the mildly dilated common bile duct could be secondary to scarring after sphincterotomy and decreased flow.  She was noted to have a leukocytosis of 13, which is 11.1 today.  Her total bilirubin is 0.2.  She denies history of abdominal surgeries.  She denies use of blood thinning medications.  She confirms smoking, but states she has not been smoking in the last month.  Past Medical History:  Diagnosis Date   Bipolar 1 disorder (HCC)    Gallbladder attack    Mental disorder    No pertinent past medical history     Past Surgical History:  Procedure Laterality Date   ADENOIDECTOMY     as a child   ERCP     ERCP with  sphincterotomy, sludge in CBD s/p removal, all apparent sludge/stones removed.   Tubes in Ears      Family History  Problem Relation Age of Onset   Diabetes Father    Hypertension Father    Diabetes Maternal Aunt    Hypertension Maternal Aunt    Hypertension Maternal Uncle    Hypertension Maternal Grandmother    Asthma Paternal Grandmother     Social History   Tobacco Use   Smoking status: Some Days    Packs/day: 0.30    Types: Cigarettes   Smokeless tobacco: Never  Vaping Use   Vaping Use: Every day  Substance Use Topics   Alcohol use: No   Drug use: No    Medications: I have reviewed the patient's current medications.  Allergies  Allergen Reactions   Shrimp [Shellfish Allergy] Anaphylaxis   Iodine Rash     ROS:  Constitutional: negative for chills, fatigue, and fevers Respiratory: negative for cough and shortness of breath Cardiovascular: negative for chest pain and palpitations Gastrointestinal: positive for abdominal pain and nausea, negative for vomiting Musculoskeletal:negative for back pain and neck pain  Blood pressure 119/74, pulse 64, temperature 97.8 F (36.6 C), resp. rate 18, height 5\' 5"  (1.651 m), weight 111.1 kg, last menstrual period 02/11/2021, SpO2 100 %. Physical Exam Vitals reviewed.  Constitutional:      Appearance: She is well-developed.  HENT:  Head: Normocephalic and atraumatic.  Eyes:     Extraocular Movements: Extraocular movements intact.     Pupils: Pupils are equal, round, and reactive to light.  Cardiovascular:     Rate and Rhythm: Normal rate and regular rhythm.  Pulmonary:     Effort: Pulmonary effort is normal.     Breath sounds: Normal breath sounds.  Abdominal:     Comments: Soft, nondistended, no percussion tenderness, mild tenderness to palpation in right upper quadrant, no rigidity, guarding, rebound tenderness; negative Murphy's  Neurological:     Mental Status: She is alert.    Results: Results for orders  placed or performed during the hospital encounter of 02/15/21 (from the past 48 hour(s))  Urinalysis, Routine w reflex microscopic Urine, Clean Catch     Status: Abnormal   Collection Time: 02/15/21  9:46 AM  Result Value Ref Range   Color, Urine YELLOW YELLOW   APPearance CLOUDY (A) CLEAR   Specific Gravity, Urine 1.021 1.005 - 1.030   pH 6.0 5.0 - 8.0   Glucose, UA NEGATIVE NEGATIVE mg/dL   Hgb urine dipstick LARGE (A) NEGATIVE   Bilirubin Urine NEGATIVE NEGATIVE   Ketones, ur NEGATIVE NEGATIVE mg/dL   Protein, ur 30 (A) NEGATIVE mg/dL   Nitrite NEGATIVE NEGATIVE   Leukocytes,Ua LARGE (A) NEGATIVE   RBC / HPF 11-20 0 - 5 RBC/hpf   WBC, UA 21-50 0 - 5 WBC/hpf   Bacteria, UA NONE SEEN NONE SEEN   Squamous Epithelial / LPF 6-10 0 - 5   Mucus PRESENT    Hyaline Casts, UA PRESENT    Ca Oxalate Crys, UA PRESENT     Comment: Performed at [facility], [address] [place], [place] [address]  Pregnancy, urine     Status: None   Collection Time: 02/15/21  9:46 AM  Result Value Ref Range   Preg Test, Ur NEGATIVE NEGATIVE    Comment:        THE SENSITIVITY OF THIS METHODOLOGY IS >20 mIU/mL. Performed at [facility], [address] [place], [place] [address]   CBC with Differential/Platelet     Status: Abnormal   Collection Time: 02/15/21  9:58 AM  Result Value Ref Range   WBC 13.0 (H) 4.0 - 10.5 K/uL   RBC 3.78 (L) 3.87 - 5.11 MIL/uL   Hemoglobin 10.4 (L) 12.0 - 15.0 g/dL   HCT 09.8 (L) 11.9 - 14.7 %   MCV 83.9 80.0 - 100.0 fL   MCH 27.5 26.0 - 34.0 pg   MCHC 32.8 30.0 - 36.0 g/dL   RDW 82.9 56.2 - 13.0 %   Platelets 521 (H) 150 - 400 K/uL   nRBC 0.0 0.0 - 0.2 %   Neutrophils Relative % 71 %   Neutro Abs 9.4 (H) 1.7 - 7.7 K/uL   Lymphocytes Relative 19 %   Lymphs Abs 2.5 0.7 - 4.0 K/uL   Monocytes Relative 5 %   Monocytes Absolute 0.7 0.1 - 1.0 K/uL   Eosinophils Relative 3 %   Eosinophils Absolute 0.3 0.0 - 0.5 K/uL   Basophils Relative 1 %   Basophils Absolute 0.1  0.0 - 0.1 K/uL   Immature Granulocytes 1 %   Abs Immature Granulocytes 0.07 0.00 - 0.07 K/uL    Comment: Performed at [facility], [address], [place], [place] [address]  Comprehensive metabolic panel     Status: Abnormal   Collection Time: 02/15/21  9:58 AM  Result Value Ref Range   Sodium  137 135 - 145 mmol/L   Potassium 3.6 3.5 - 5.1 mmol/L   Chloride 104 98 - 111 mmol/L   CO2 26 22 - 32 mmol/L   Glucose, Bld 108 (H) 70 - 99 mg/dL    Comment: Glucose reference range applies only to samples taken after fasting for at least 8 hours.   BUN 9 6 - 20 mg/dL   Creatinine, Ser 4.090.66 0.44 - 1.00 mg/dL   Calcium 9.0 8.9 - 81.110.3 mg/dL   Total Protein 7.3 6.5 - 8.1 g/dL   Albumin 3.5 3.5 - 5.0 g/dL   AST 15 15 - 41 U/L   ALT 17 0 - 44 U/L   Alkaline Phosphatase 87 38 - 126 U/L   Total Bilirubin 0.1 (L) 0.3 - 1.2 mg/dL   GFR, Estimated >91>60 >47>60 mL/min    Comment: (NOTE) Calculated using the CKD-EPI Creatinine Equation (2021)    Anion gap 7 5 - 15    Comment: Performed at [facility], [address] [place], [place] [address]  Lipase, blood     Status: None   Collection Time: 02/15/21  9:58 AM  Result Value Ref Range   Lipase 24 11 - 51 U/L    Comment: Performed at [facility], [address] [place], [place] [address]  Hepatic function panel     Status: Abnormal   Collection Time: 02/15/21  9:58 AM  Result Value Ref Range   Total Protein 7.3 6.5 - 8.1 g/dL   Albumin 3.4 (L) 3.5 - 5.0 g/dL   AST 17 15 - 41 U/L   ALT 18 0 - 44 U/L   Alkaline Phosphatase 89 38 - 126 U/L   Total Bilirubin 0.2 (L) 0.3 - 1.2 mg/dL   Bilirubin, Direct <6.5<0.1 0.0 - 0.2 mg/dL   Indirect Bilirubin NOT CALCULATED 0.3 - 0.9 mg/dL    Comment: Performed at [facility], [address] [place], [place] [address]  Iron and TIBC     Status: Abnormal   Collection Time: 02/15/21  9:58 AM  Result Value Ref Range   Iron 13 (L) 28 - 170 ug/dL   TIBC 629386 528250 - 413450 ug/dL   Saturation Ratios 3 (L) 10.4 - 31.8 %    UIBC 373 ug/dL    Comment: Performed at [facility], [address] [place], [place] [address]  Ferritin     Status: None   Collection Time: 02/15/21  9:58 AM  Result Value Ref Range   Ferritin 18 11 - 307 ng/mL    Comment: Performed at [facility], [address] [place], [place] [address]  Comprehensive metabolic panel     Status: Abnormal   Collection Time: 02/16/21  4:24 AM  Result Value Ref Range   Sodium 139 135 - 145 mmol/L   Potassium 3.7 3.5 - 5.1 mmol/L   Chloride 103 98 - 111 mmol/L   CO2 26 22 - 32 mmol/L   Glucose, Bld 77 70 - 99 mg/dL    Comment: Glucose reference range applies only to samples taken after fasting for at least 8 hours.   BUN 7 6 - 20 mg/dL   Creatinine, Ser 3.660.63 0.44 - 1.00 mg/dL   Calcium 9.1 8.9 - 44.010.3 mg/dL   Total Protein 7.1 6.5 - 8.1 g/dL   Albumin 3.3 (L) 3.5 - 5.0 g/dL   AST 16 15 - 41 U/L   ALT 16 0 - 44 U/L   Alkaline Phosphatase 87 38 - 126 U/L  Total Bilirubin 0.2 (L) 0.3 - 1.2 mg/dL   GFR, Estimated >40>60 >98>60 mL/min    Comment: (NOTE) Calculated using the CKD-EPI Creatinine Equation (2021)    Anion gap 10 5 - 15    Comment: Performed at [facility], [address] [place], [place] [address]  CBC     Status: Abnormal   Collection Time: 02/16/21  4:24 AM  Result Value Ref Range   WBC 11.1 (H) 4.0 - 10.5 K/uL   RBC 3.76 (L) 3.87 - 5.11 MIL/uL   Hemoglobin 10.1 (L) 12.0 - 15.0 g/dL   HCT 78.231.6 (L) 95.636.0 - 21.346.0 %   MCV 84.0 80.0 - 100.0 fL   MCH 26.9 26.0 - 34.0 pg   MCHC 32.0 30.0 - 36.0 g/dL   RDW 08.614.5 57.811.5 - 46.915.5 %   Platelets 572 (H) 150 - 400 K/uL   nRBC 0.0 0.0 - 0.2 %    Comment: Performed at [facility], [address] [place], [place] [address]  Surgical pcr screen     Status: None   Collection Time: 02/16/21  4:31 AM   Specimen: Nasal Mucosa; Nasal Swab  Result Value Ref Range   MRSA, PCR NEGATIVE NEGATIVE   Staphylococcus aureus NEGATIVE NEGATIVE    Comment: (NOTE) The Xpert SA Assay (FDA approved for NASAL  specimens in patients 30 years of age and older), is one component of a comprehensive surveillance program. It is not intended to diagnose infection nor to guide or monitor treatment. Performed at [facility], [address] [place], [place] [address]     US PELVIC COMPLETE WITH TRANSVAGINAL  Result Date: 02/15/2021 CLINICAL DATA:  Left ovarian mass seen in the CT EXAM: TRANSABDOMINAL AND TRANSVAGINAL ULTRASOUND OF PELVIS DOPPLER ULTRASOUND OF OVARIES TECHNIQUE: Both transabdominal and transvaginal ultrasound examinations of the pelvis were performed. Transabdominal technique was performed for global imaging of the pelvis including uterus, ovaries, adnexal regions, and pelvic cul-de-sac. It was necessary to proceed with endovaginal exam following the transabdominal exam to visualize the ovaries. Color and duplex Doppler ultrasound was utilized to evaluate blood flow to the ovaries. COMPARISON:  04/15/2018 FINDINGS: Uterus Measurements: 7.9 x 4 x 5.7 cm = volume: 93.2 mL. There is inhomogeneous echogenicity without discrete nodules. Endometrium Thickness: Endometrial stripe measures 11 mm. There are small hyperechoic foci in the endometrium within the fundus without definite acoustic shadowing. Right ovary Measurements: 3.5 x 2 x 2.2 = volume:  8 mL.  Unremarkable Left ovary Left ovary is not sonographically visualized and not evaluated. Color Doppler evaluation of right ovary demonstrates vascular flow. Other findings No abnormal free fluid. IMPRESSION: Endometrial stripe is prominent measuring 11 mm with questionable minimal calcifications. There is inhomogeneous echogenicity in myometrium without discrete nodules. Left ovary is not sonographically visualized and not evaluated. Electronically Signed   By: Ernie AvenaPalani  Rathinasamy M.D.   On: 02/15/2021 12:30   US Abdomen Limited RUQ (LIVER/GB)  Result Date: 02/15/2021 CLINICAL DATA:  Right upper quadrant pain for 6 days. EXAM: ULTRASOUND ABDOMEN LIMITED  RIGHT UPPER QUADRANT COMPARISON:  CT abdomen and pelvis 02/11/2021 FINDINGS: Gallbladder: No gallstones or wall thickening visualized. No sonographic Murphy sign noted by sonographer. Common bile duct: Diameter: 10 mm proximally with distal tapering Liver: Increased parenchymal echogenicity diffusely without a focal lesion identified. Portal vein is patent on color Doppler imaging with normal direction of blood flow towards the liver. Other: None. IMPRESSION: 1. Mildly dilated common bile duct which tapers distally. Recommend laboratory correlation. 2. Normal appearance of  the gallbladder. 3. Echogenic liver, nonspecific though may reflect steatosis. Electronically Signed   By: Sebastian Ache M.D.   On: 02/15/2021 12:23     Assessment & Plan:  LEDORA DELKER is a 31 y.o. female who was admitted with concern for possible cholecystitis with history of choledocholithiasis in 2012  -I evaluated the patient's ultrasound myself, gallbladder without significant findings, common bile duct dilated at 10 mm with distal tapering -Plan for laparoscopic cholecystectomy this morning -I counseled the patient about the indication, risks and benefits of laparoscopic cholecystectomy.  She understands there is a very small chance for bleeding, infection, injury to normal structures (including common bile duct), conversion to open surgery, persistent symptoms, evolution of postcholecystectomy diarrhea, need for secondary interventions, anesthesia reaction, cardiopulmonary issues and other risks not specifically detailed here. I described the expected recovery, the plan for follow-up and the restrictions during the recovery phase.  All questions were answered. -NPO -Leukocytosis slightly improved this morning, 11.1 from 13 -Total bilirubin 0.2 and LFTs within normal limits -Appreciate GI recommendations -Further recommendations to follow surgery -Remainder of care per primary team  All questions were answered to the  satisfaction of the patient.  -- Theophilus Kinds, DO [facility] [address] [place], [place] [phone] (office)

## 2021-02-17 DIAGNOSIS — K810 Acute cholecystitis: Secondary | ICD-10-CM | POA: Diagnosis not present

## 2021-02-17 LAB — COMPREHENSIVE METABOLIC PANEL
ALT: 24 U/L (ref 0–44)
AST: 30 U/L (ref 15–41)
Albumin: 3.2 g/dL — ABNORMAL LOW (ref 3.5–5.0)
Alkaline Phosphatase: 81 U/L (ref 38–126)
Anion gap: 9 (ref 5–15)
BUN: 7 mg/dL (ref 6–20)
CO2: 24 mmol/L (ref 22–32)
Calcium: 9 mg/dL (ref 8.9–10.3)
Chloride: 102 mmol/L (ref 98–111)
Creatinine, Ser: 0.63 mg/dL (ref 0.44–1.00)
GFR, Estimated: >60 mL/min (ref >60)
Glucose, Bld: 120 mg/dL — ABNORMAL HIGH (ref 70–99)
Potassium: 3.7 mmol/L (ref 3.5–5.1)
Sodium: 135 mmol/L (ref 135–145)
Total Bilirubin: 0.2 mg/dL — ABNORMAL LOW (ref 0.3–1.2)
Total Protein: 6.9 g/dL (ref 6.5–8.1)

## 2021-02-17 LAB — CBC
HCT: 29.3 % — ABNORMAL LOW (ref 36.0–46.0)
Hemoglobin: 9.2 g/dL — ABNORMAL LOW (ref 12.0–15.0)
MCH: 26.2 pg (ref 26.0–34.0)
MCHC: 31.4 g/dL (ref 30.0–36.0)
MCV: 83.5 fL (ref 80.0–100.0)
Platelets: 544 10*3/uL — ABNORMAL HIGH (ref 150–400)
RBC: 3.51 MIL/uL — ABNORMAL LOW (ref 3.87–5.11)
RDW: 14.5 % (ref 11.5–15.5)
WBC: 14.1 10*3/uL — ABNORMAL HIGH (ref 4.0–10.5)
nRBC: 0 % (ref 0.0–0.2)

## 2021-02-17 LAB — URINE CULTURE: Culture: 50000 — AB

## 2021-02-17 MED ORDER — CEPHALEXIN 500 MG PO CAPS: 500.0000 mg | ORAL_CAPSULE | Freq: Three times a day (TID) | ORAL | 0 refills | Status: AC

## 2021-02-17 MED ORDER — ACETAMINOPHEN 325 MG PO TABS: 650.0000 mg | ORAL_TABLET | ORAL | 2 refills | Status: AC | PRN

## 2021-02-17 MED ORDER — ONDANSETRON HCL 4 MG PO TABS: 4.0000 mg | ORAL_TABLET | Freq: Four times a day (QID) | ORAL | 0 refills | Status: DC | PRN

## 2021-02-17 NOTE — Discharge Instructions (Addendum)
-  1) please take cephalexin/Keflex antibiotic as prescribed for urinary tract infection 2) please follow-up with general surgeon as advised in 2 weeks for post operative checkup  Surgery Discharge Instructions  General Anesthesia or Sedation Do not drive or operate heavy machinery for 24 hours.  Do not consume alcohol, tranquilizers, sleeping medications, or any non-prescribed medications for 24 hours. Do not make important decisions or sign any important papers in the next 24 hours. You should have someone with you tonight at home.  Activity  You are advised to go directly home from the hospital.  Resume light activity. No heavy lifting over 10 lbs or strenuous exercise.  Fluids and Diet Regular, Heart Healthy diet  Medications  If you have not had a bowel movement in 24 hours, take 2 tablespoons over the counter Milk of mag.             You May resume your blood thinners tomorrow (Aspirin, coumadin, or other).  You are being discharged with prescriptions for Opioid/Narcotic Medications: There are some specific considerations for these medications that you should know. Opioid Meds have risks & benefits. Addiction to these meds is always a concern with prolonged use Take medication only as directed Do not drive while taking narcotic pain medication Do not crush tablets or capsules Do not use a different container than medication was dispensed in Lock the container of medication in a cool, dry place out of reach of children and pets. Opioid medication can cause addiction Do not share with anyone else (this is a felony) Do not store medications for future use. Dispose of them properly.     Disposal:  Find a [employer] household drug take back site near you.  If you can't get to a drug take back site, use the recipe below as a last resort to dispose of expired, unused or unwanted drugs. Disposal  (Do not dispose chemotherapy drugs this way, talk to your prescribing doctor instead.)  Step 1: Mix drugs (do not crush) with dirt, kitty litter, or used coffee grounds and add a small amount of water to dissolve any solid medications. Step 2: Seal drugs in plastic bag. Step 3: Place plastic bag in trash. Step 4: Take prescription container and scratch out personal information, then recycle or throw away.  Operative Site  You have a liquid bandage over your incisions, this will begin to flake off in about a week. Ok to Games developer. Keep wound clean and dry. No baths or swimming. No lifting more than 10 pounds.  Contact Information: If you have questions or concerns, please call our office, [phone], Monday- Thursday 8AM-5PM and Friday 8AM-12Noon.  If it is after hours or on the weekend, please call [facility] Main [facility], [phone], and ask to speak to the surgeon on call for Dr. Okey Dupre at [facility].   SPECIFIC COMPLICATIONS TO WATCH FOR: Inability to urinate Fever over 101? F by mouth Nausea and vomiting lasting longer than 24 hours. Pain not relieved by medication ordered Swelling around the operative site Increased redness, warmth, hardness, around operative area Numbness, tingling, or cold fingers or toes Blood -soaked dressing, (small amounts of oozing may be normal) Increasing and progressive drainage from surgical area or exam site

## 2021-02-17 NOTE — Progress Notes (Signed)
[facility] Progress Note  1 Day Post-Op  Subjective: Patient seen and examined.  She is resting comfortably in bed.  She was able to tolerate dinner and her breakfast this morning without nausea and vomiting.  She does have some abdominal pain, though she describes it as a sore feeling, and it is improved from the pain that she experienced preoperatively.  She has been passing flatus, but denies bowel movement since surgery.  Objective: Vital signs in last 24 hours: Temp:  [97.7 F (36.5 C)-98.3 F (36.8 C)] 97.8 F (36.6 C) (01/28 0434) Pulse Rate:  [65-85] 76 (01/28 0434) Resp:  [12-22] 16 (01/28 0434) BP: (109-135)/(57-85) 125/71 (01/28 0434) SpO2:  [93 %-99 %] 99 % (01/28 0434) Last BM Date: 02/11/21  Intake/Output from previous day: 01/27 0701 - 01/28 0700 In: 1749.8 [I.V.:1649.8; IV Piggyback:100] Out: 10 [Blood:10] Intake/Output this shift: No intake/output data recorded.  General appearance: alert, cooperative, and no distress GI: Soft, nondistended, no percussion tenderness, mild tenderness to palpation along incisions, incision C/D/I with skin glue in place  Lab Results:  Recent Labs    02/16/21 0424 02/17/21 0415  WBC 11.1* 14.1*  HGB 10.1* 9.2*  HCT 31.6* 29.3*  PLT 572* 544*   BMET Recent Labs    02/16/21 0424 02/17/21 0415  NA 139 135  K 3.7 3.7  CL 103 102  CO2 26 24  GLUCOSE 77 120*  BUN 7 7  CREATININE 0.63 0.63  CALCIUM 9.1 9.0   PT/INR No results for input(s): LABPROT, INR in the last 72 hours.  Studies/Results: US PELVIC COMPLETE WITH TRANSVAGINAL  Result Date: 02/15/2021 CLINICAL DATA:  Left ovarian mass seen in the CT EXAM: TRANSABDOMINAL AND TRANSVAGINAL ULTRASOUND OF PELVIS DOPPLER ULTRASOUND OF OVARIES TECHNIQUE: Both transabdominal and transvaginal ultrasound examinations of the pelvis were performed. Transabdominal technique was performed for global imaging of the pelvis including uterus, ovaries, adnexal  regions, and pelvic cul-de-sac. It was necessary to proceed with endovaginal exam following the transabdominal exam to visualize the ovaries. Color and duplex Doppler ultrasound was utilized to evaluate blood flow to the ovaries. COMPARISON:  04/15/2018 FINDINGS: Uterus Measurements: 7.9 x 4 x 5.7 cm = volume: 93.2 mL. There is inhomogeneous echogenicity without discrete nodules. Endometrium Thickness: Endometrial stripe measures 11 mm. There are small hyperechoic foci in the endometrium within the fundus without definite acoustic shadowing. Right ovary Measurements: 3.5 x 2 x 2.2 = volume:  8 mL.  Unremarkable Left ovary Left ovary is not sonographically visualized and not evaluated. Color Doppler evaluation of right ovary demonstrates vascular flow. Other findings No abnormal free fluid. IMPRESSION: Endometrial stripe is prominent measuring 11 mm with questionable minimal calcifications. There is inhomogeneous echogenicity in myometrium without discrete nodules. Left ovary is not sonographically visualized and not evaluated. Electronically Signed   By: Ernie Avena M.D.   On: 02/15/2021 12:30   US Abdomen Limited RUQ (LIVER/GB)  Result Date: 02/15/2021 CLINICAL DATA:  Right upper quadrant pain for 6 days. EXAM: ULTRASOUND ABDOMEN LIMITED RIGHT UPPER QUADRANT COMPARISON:  CT abdomen and pelvis 02/11/2021 FINDINGS: Gallbladder: No gallstones or wall thickening visualized. No sonographic Murphy sign noted by sonographer. Common bile duct: Diameter: 10 mm proximally with distal tapering Liver: Increased parenchymal echogenicity diffusely without a focal lesion identified. Portal vein is patent on color Doppler imaging with normal direction of blood flow towards the liver. Other: None. IMPRESSION: 1. Mildly dilated common bile duct which tapers distally. Recommend laboratory correlation. 2. Normal appearance of the gallbladder.  3. Echogenic liver, nonspecific though may reflect steatosis. Electronically  Signed   By: Sebastian Ache M.D.   On: 02/15/2021 12:23    Anti-infectives: Anti-infectives (From admission, onward)    Start     Dose/Rate Route Frequency Ordered Stop   02/17/21 1000  cefTRIAXone (ROCEPHIN) 2 g in sodium chloride 0.9 % 100 mL IVPB  Status:  Discontinued        2 g 200 mL/hr over 30 Minutes Intravenous Every 24 hours 02/16/21 1408 02/16/21 1409   02/17/21 0000  cefTRIAXone (ROCEPHIN) 2 g in sodium chloride 0.9 % 100 mL IVPB  Status:  Discontinued        2 g 200 mL/hr over 30 Minutes Intravenous Every 24 hours 02/16/21 1406 02/16/21 1408   02/17/21 0000  cefTRIAXone (ROCEPHIN) 2 g in sodium chloride 0.9 % 100 mL IVPB        2 g 200 mL/hr over 30 Minutes Intravenous Every 24 hours 02/16/21 1409     02/16/21 0915  cefoTEtan (CEFOTAN) 2 g in sodium chloride 0.9 % 100 mL IVPB        2 g 200 mL/hr over 30 Minutes Intravenous On call to O.R. 02/16/21 0819 02/16/21 1145   02/15/21 1815  cefTRIAXone (ROCEPHIN) 2 g in sodium chloride 0.9 % 100 mL IVPB  Status:  Discontinued        2 g 200 mL/hr over 30 Minutes Intravenous Daily 02/15/21 1808 02/16/21 3149       Assessment/Plan:  Patient is a 31 year old female who is status post laparoscopic cholecystectomy for history of choledocholithiasis and likely chronic cholecystitis.  -Mild leukocytosis of 14.1 noted on blood work today, likely reactive from surgery -LFTs and bilirubins all within normal limits -Continue regular, heart healthy diet -Follow-up with me in 2 weeks -No need for antibiotics at discharge -PRN roxicodone -Stable for discharge from general surgery standpoint   LOS: 1 day    Wynne Jury A Tamario Heal 02/17/2021

## 2021-02-17 NOTE — Discharge Summary (Signed)
Gina Hurley, is a 31 y.o. female  DOB 1990-09-11  MRN [id number].  Admission date:  02/15/2021  Admitting Physician  Roxan Hockey, MD  Discharge Date:  02/17/2021   Primary MD  Lorelee Market, MD  Recommendations for primary care physician for things to follow:   1) please take cephalexin/Keflex antibiotic as prescribed for urinary tract infection 2) please follow-up with general surgeon as advised in 2 weeks for post operative checkup   Admission Diagnosis  RUQ abdominal pain [R10.11] Abdominal pain [R10.9] Ovarian mass, left [N83.8] Acute cholecystitis [K81.0]   Discharge Diagnosis  RUQ abdominal pain [R10.11] Abdominal pain [R10.9] Ovarian mass, left [N83.8] Acute cholecystitis [K81.0]    Principal Problem:   Acute cholecystitis Active Problems:   Obesity, Class III, BMI 40-49.9 (morbid obesity) ([place])   Abdominal pain   RUQ abdominal pain   Chronic cholecystitis      Past Medical History:  Diagnosis Date   Bipolar 1 disorder ([place])    Gallbladder attack    Mental disorder    No pertinent past medical history     Past Surgical History:  Procedure Laterality Date   ADENOIDECTOMY     as a child   ERCP     ERCP with sphincterotomy, sludge in CBD s/p removal, all apparent sludge/stones removed.   Tubes in Ears         HPI  from the history and physical done on the day of admission:    Chief Complaint: Abdominal pain   HPI: Gina Hurley is a 31 y.o. female with medical history significant for bipolar disorder.  She presented to the ED with complaints of abdominal pain of 6 days duration.  She reports mostly sharp right upper abdominal pain, she feels it might be worse with food she is not sure, with associated nausea, but no vomiting.  She reports abdominal pain now involves the left side of her abdomen.  No diarrhea.  Patient went to [facility] 3 days ago on the  23rd, came here to the ED on the 24th, subsequently here again today for the same abdominal pain which she feels is worsening. Reports yesterday she started having lower abdominal pain, different from her period pain. Yesterday she saw a little amount of blood from her vagina, but today she saw a more significant amount like she was on her period.  Patient said she just completed her period 4 days ago, started on the 15th.  Normally her period last for 7 days.  She denies prior episodes of irregular or abnormal vaginal bleeding. She reports increased urinary frequency over the past 2 days.  Denies dysuria.   Per Care Everywhere CT at [facility] 1/23-without acute intra-abdominal or pelvic pathology, showed asymmetric enlargement of left ovary.  Pelvic ultrasound recommended.   ED visits 1/24 here and pain, patient was referred to completes pelvic ultrasound as outpatient, unfortunately she was unable to find transportation.   ED Course: Stable vitals.  Pregnancy test negative.  Upper quadrant abdominal ultrasound sound  shows mildly dilated common bile duct which tapers distally, lab correlation recommended.  Normal gallbladder. Pelvic ultrasound-11 mm endometrial stripe with questionable minimal calcifications.  Inhomogeneous echogenicity in the myometrium without discrete nodules, left ovary not visualized or evaluated. GI was consulted, and subsequently general surgery.  Plan for hospitalization for further work-up.   Review of Systems: As per HPI all other systems reviewed and negative     Hospital Course:    1)Status post lap chole--02/16/21 -Status post prior  ERCP, sphincterotomy and stone removal in 2012. -Medically stable at this time tolerating oral intake well, passing gas, no BM yet -Per general surgery okay to discharge home   2)Vaginal bleeding--suspect dysfunctional uterine bleeding -CT abdomen and pelvic ultrasound report noted -Outpatient follow-up with GYN if symptoms  persist advised   3)Anemia--acute versus acute on chronic patient with vaginal bleeding and status post lap chole -Repeat CBC with   4) Streptococcus UTI--treated with IV Rocephin , okay to discharge on p.o. Keflex    5)Morbid Obesity- -Low calorie diet, portion control and increase physical activity discussed with patient -Body mass index is 40.77 kg/m.  6) leukocytosis-suspect this is reactive in the postop patient     Disposition--discharge home   Disposition: The patient is from: Home              Anticipated d/c is to: Home            Code Status :  -  Code Status: Full Code    Family Communication:    NA (patient is alert, awake and coherent)    Consults  :  Gen surg  Discharge Condition: Stable  Follow UP   Follow-up Information     Pappayliou, Catherine A, DO. Call.   Specialty: General Surgery Why: Call to make a phone follow up with me in 2 weeks Contact information: [id number] [place] [facility] [id number] [phone]                 Diet and Activity recommendation:  As advised  Discharge Instructions    Discharge Instructions     Call MD for:  difficulty breathing, headache or visual disturbances   Complete by: As directed    Call MD for:  persistant dizziness or light-headedness   Complete by: As directed    Call MD for:  persistant nausea and vomiting   Complete by: As directed    Call MD for:  severe uncontrolled pain   Complete by: As directed    Call MD for:  temperature >100.4   Complete by: As directed    Diet - low sodium heart healthy   Complete by: As directed    Discharge instructions   Complete by: As directed    1) please take cephalexin/Keflex antibiotic as prescribed for urinary tract infection 2) please follow-up with general surgeon as advised in 2 weeks for post operative checkup   Discharge wound care:   Complete by: As directed    Keep wound clean and dry   Increase activity slowly   Complete by: As directed         Discharge Medications     Allergies as of 02/17/2021       Reactions   Shrimp [shellfish Allergy] Anaphylaxis   Iodine Rash        Medication List     STOP taking these medications    HYDROcodone-acetaminophen 5-325 MG tablet Commonly known as: Norco       TAKE these medications  acetaminophen 325 MG tablet Commonly known as: Tylenol Take 2 tablets (650 mg total) by mouth every 4 (four) hours as needed.   cephALEXin 500 MG capsule Commonly known as: Keflex Take 1 capsule (500 mg total) by mouth 3 (three) times daily for 3 days. For UTI Start taking on: February 18, 2021   ondansetron 4 MG tablet Commonly known as: ZOFRAN Take 1 tablet (4 mg total) by mouth every 6 (six) hours as needed for nausea or vomiting.               Discharge Care Instructions  (From admission, onward)           Start     Ordered   02/17/21 0000  Discharge wound care:       Comments: Keep wound clean and dry   02/17/21 1245           Major procedures and Radiology Reports - PLEASE review detailed and final reports for all details, in brief -   US PELVIC COMPLETE WITH TRANSVAGINAL  Result Date: 02/15/2021 CLINICAL DATA:  Left ovarian mass seen in the CT EXAM: TRANSABDOMINAL AND TRANSVAGINAL ULTRASOUND OF PELVIS DOPPLER ULTRASOUND OF OVARIES TECHNIQUE: Both transabdominal and transvaginal ultrasound examinations of the pelvis were performed. Transabdominal technique was performed for global imaging of the pelvis including uterus, ovaries, adnexal regions, and pelvic cul-de-sac. It was necessary to proceed with endovaginal exam following the transabdominal exam to visualize the ovaries. Color and duplex Doppler ultrasound was utilized to evaluate blood flow to the ovaries. COMPARISON:  04/15/2018 FINDINGS: Uterus Measurements: 7.9 x 4 x 5.7 cm = volume: 93.2 mL. There is inhomogeneous echogenicity without discrete nodules. Endometrium Thickness: Endometrial stripe  measures 11 mm. There are small hyperechoic foci in the endometrium within the fundus without definite acoustic shadowing. Right ovary Measurements: 3.5 x 2 x 2.2 = volume:  8 mL.  Unremarkable Left ovary Left ovary is not sonographically visualized and not evaluated. Color Doppler evaluation of right ovary demonstrates vascular flow. Other findings No abnormal free fluid. IMPRESSION: Endometrial stripe is prominent measuring 11 mm with questionable minimal calcifications. There is inhomogeneous echogenicity in myometrium without discrete nodules. Left ovary is not sonographically visualized and not evaluated. Electronically Signed   By: Elmer Picker M.D.   On: 02/15/2021 12:30   US Abdomen Limited RUQ (LIVER/GB)  Result Date: 02/15/2021 CLINICAL DATA:  Right upper quadrant pain for 6 days. EXAM: ULTRASOUND ABDOMEN LIMITED RIGHT UPPER QUADRANT COMPARISON:  CT abdomen and pelvis 02/11/2021 FINDINGS: Gallbladder: No gallstones or wall thickening visualized. No sonographic Murphy sign noted by sonographer. Common bile duct: Diameter: 10 mm proximally with distal tapering Liver: Increased parenchymal echogenicity diffusely without a focal lesion identified. Portal vein is patent on color Doppler imaging with normal direction of blood flow towards the liver. Other: None. IMPRESSION: 1. Mildly dilated common bile duct which tapers distally. Recommend laboratory correlation. 2. Normal appearance of the gallbladder. 3. Echogenic liver, nonspecific though may reflect steatosis. Electronically Signed   By: Logan Bores M.D.   On: 02/15/2021 12:23    Micro Results   Recent Results (from the past 240 hour(s))  Urine Culture     Status: Abnormal   Collection Time: 02/15/21 11:00 AM   Specimen: Urine, Clean Catch  Result Value Ref Range Status   Specimen Description   Final    URINE, CLEAN CATCH Performed at [facility], [address], [place],  [address]    Special Requests   Final  NONE Performed at [facility], [address], [place], [place] [address]    Culture (A)  Final    50,000 COLONIES/mL GROUP B STREP(S.AGALACTIAE)ISOLATED TESTING AGAINST S. AGALACTIAE NOT ROUTINELY PERFORMED DUE TO PREDICTABILITY OF AMP/PEN/VAN SUSCEPTIBILITY. Performed at [facility], [place] [address], [place], [place] [address]    Report Status 02/17/2021 FINAL  Final  Surgical pcr screen     Status: None   Collection Time: 02/16/21  4:31 AM   Specimen: Nasal Mucosa; Nasal Swab  Result Value Ref Range Status   MRSA, PCR NEGATIVE NEGATIVE Final   Staphylococcus aureus NEGATIVE NEGATIVE Final    Comment: (NOTE) The Xpert SA Assay (FDA approved for NASAL specimens in patients 28 years of age and older), is one component of a comprehensive surveillance program. It is not intended to diagnose infection nor to guide or monitor treatment. Performed at [facility], [address], [place], [place] [address]    Today   Subjective   Gina Hurley today has no new complaints No fever  Or chills  -Tolerating oral intake well, passing gas, no BM -No nausea or vomiting          Patient has been seen and examined prior to discharge   Objective   Blood pressure 125/71, pulse 76, temperature 97.8 F (36.6 C), temperature source Oral, resp. rate 16, height 5\' 5"  (1.651 m), weight 111.1 kg, last menstrual period 02/11/2021, SpO2 99 %.   Intake/Output Summary (Last 24 hours) at 02/17/2021 1246 Last data filed at 02/17/2021 0048 Gross per 24 hour  Intake 749.83 ml  Output 10 ml  Net 739.83 ml    Exam Gen:- Awake Alert, no acute distress , morbidly obese HEENT:- [place].AT, No sclera icterus Neck-Supple Neck,No JVD,.  Lungs-  CTAB , good air movement bilaterally  CV- S1, S2 normal, regular Abd-  +ve B.Sounds, Abd Soft, appropriate postop tenderness, no CVA area tenderness Extremity/Skin:- No  edema,   good pulses Psych-affect is appropriate, oriented x3 Neuro-no new focal deficits,  no tremors    Data Review   CBC w Diff:  Lab Results  Component Value Date   WBC 14.1 (H) 02/17/2021   HGB 9.2 (L) 02/17/2021   HGB 12.8 03/18/2013   HCT 29.3 (L) 02/17/2021   HCT 37.4 03/18/2013   PLT 544 (H) 02/17/2021   PLT 370 03/18/2013   LYMPHOPCT 19 02/15/2021   MONOPCT 5 02/15/2021   EOSPCT 3 02/15/2021   BASOPCT 1 02/15/2021    CMP:  Lab Results  Component Value Date   NA 135 02/17/2021   NA 137 03/18/2013   K 3.7 02/17/2021   K 4.1 03/18/2013   CL 102 02/17/2021   CL 107 03/18/2013   CO2 24 02/17/2021   CO2 23 03/18/2013   BUN 7 02/17/2021   BUN 16 03/18/2013   CREATININE 0.63 02/17/2021   CREATININE 0.72 03/18/2013   PROT 6.9 02/17/2021   ALBUMIN 3.2 (L) 02/17/2021   BILITOT 0.2 (L) 02/17/2021   ALKPHOS 81 02/17/2021   AST 30 02/17/2021   ALT 24 02/17/2021  .   Total Discharge time is about 33 minutes  Roxan Hockey M.D on 02/17/2021 at 12:46 PM  Go to [email or web address] -  for contact info  [facility] - Office  [phone]

## 2021-02-19 ENCOUNTER — Encounter (HOSPITAL_COMMUNITY): Payer: Self-pay | Admitting: Surgery

## 2021-02-19 LAB — SURGICAL PATHOLOGY

## 2021-03-01 ENCOUNTER — Ambulatory Visit (INDEPENDENT_AMBULATORY_CARE_PROVIDER_SITE_OTHER): Payer: Medicaid Other | Admitting: Surgery

## 2021-03-01 DIAGNOSIS — Z09 Encounter for follow-up examination after completed treatment for conditions other than malignant neoplasm: Secondary | ICD-10-CM

## 2021-03-01 NOTE — Progress Notes (Signed)
[facility]  I am calling the patient for post operative evaluation. This is not a billable encounter as it is under the global charges for the surgery.  I verified that I was speaking to Gina Hurley, and I was present in the office during the phone conversation.  The patient had a laparoscopic cholecystectomy on 1/27. The patient reports that she has been doing well since the surgery.  She denies any pain like the pain that brought her to the hospital.  She is occasionally using her pain medications that she was discharged with, however she normally uses them to help her sleep in the evenings..  She is tolerating a diet, having good pain control, and having regular Bms.  The incisions are healing well, and the skin glue has peeled off. The patient has no concerns.  All questions were answered to the patient's expressed satisfaction.  Pathology: FINAL MICROSCOPIC DIAGNOSIS:   A. GALLBLADDER, CHOLECYSTECTOMY:  - Chronic cholecystitis   Will see the patient PRN.   Theophilus Kinds, DO [facility] [address] [place], [place] [address] [phone] (office)

## 2022-05-21 ENCOUNTER — Emergency Department (HOSPITAL_COMMUNITY)
Admission: EM | Admit: 2022-05-21 | Discharge: 2022-05-22 | Disposition: A | Discharge status: Home / Self Care | Payer: Medicaid Other | Attending: Emergency Medicine | Admitting: Emergency Medicine

## 2022-05-21 ENCOUNTER — Other Ambulatory Visit: Payer: Self-pay

## 2022-05-21 DIAGNOSIS — J020 Streptococcal pharyngitis: Secondary | ICD-10-CM | POA: Insufficient documentation

## 2022-05-21 DIAGNOSIS — J029 Acute pharyngitis, unspecified: Secondary | ICD-10-CM | POA: Diagnosis present

## 2022-05-21 DIAGNOSIS — Z1152 Encounter for screening for COVID-19: Secondary | ICD-10-CM | POA: Diagnosis not present

## 2022-05-21 NOTE — ED Triage Notes (Signed)
Pt c/o sore throat, cough, headache x 4 days with diarrhea and body aches. No known sick contacts.

## 2022-05-22 LAB — GROUP A STREP BY PCR: Group A Strep by PCR: DETECTED — AB

## 2022-05-22 LAB — SARS CORONAVIRUS 2 BY RT PCR: SARS Coronavirus 2 by RT PCR: NEGATIVE

## 2022-05-22 MED ORDER — PENICILLIN G BENZATHINE 1200000 UNIT/2ML IM SUSY
1.2000 10*6.[IU] | PREFILLED_SYRINGE | Freq: Once | INTRAMUSCULAR | Status: AC
  Administered 2022-05-22: 1.2 10*6.[IU] via INTRAMUSCULAR
  Filled 2022-05-22: qty 2

## 2022-05-22 NOTE — ED Provider Notes (Signed)
[place] EMERGENCY DEPARTMENT AT [facility] Provider Note   CSN: [id number] Arrival date & time: 05/21/22  2158     History  Chief Complaint  Patient presents with   Sore Throat    Gina Hurley is a 32 y.o. female.   Sore Throat  Patient is a 32 year old female with past medical history significant for gallstones and reflux and bipolar  She presents emergency room today with complaint of sore throat, dry cough, some diarrhea and myalgias for the past 4 days.  She denies any changes in her voice or difficulty breathing.  No chest pain.  No fevers at home.  No syncope or near syncope.  She states she has had strep throat 1 or 2 times in the past and feels similar.     Home Medications Prior to Admission medications   Medication Sig Start Date End Date Taking? Authorizing Provider  ondansetron (ZOFRAN) 4 MG tablet Take 1 tablet (4 mg total) by mouth every 6 (six) hours as needed for nausea or vomiting. 02/17/21   Shon Hale, MD      Allergies    Shrimp [shellfish allergy] and Iodine    Review of Systems   Review of Systems  Physical Exam Updated Vital Signs BP (!) 150/100 (BP Location: Right Arm)   Pulse 100   Temp 97.8 F (36.6 C) (Oral)   Resp 20   Ht 5\' 5"  (1.651 m)   Wt 113.4 kg   LMP 04/03/2022 (Approximate)   SpO2 98%   BMI 41.60 kg/m  Physical Exam Vitals and nursing note reviewed.  Constitutional:      General: She is not in acute distress.    Appearance: Normal appearance. She is not ill-appearing.  HENT:     Head: Normocephalic and atraumatic.     Mouth/Throat:     Mouth: Mucous membranes are moist.     Tonsils: Tonsillar exudate and tonsillar abscess present.     Comments: Bilateral tonsillar exudates with tonsillar hypertrophy approximately 2+ uvula midline normal phonation posterior pharyngeal erythema present. Eyes:     General: No scleral icterus.       Right eye: No discharge.        Left eye: No discharge.      Conjunctiva/sclera: Conjunctivae normal.  Neck:     Comments: Cervical anterior lymphadenopathy Cardiovascular:     Rate and Rhythm: Normal rate.  Pulmonary:     Effort: Pulmonary effort is normal.     Breath sounds: No stridor.  Abdominal:     Tenderness: There is no abdominal tenderness.  Musculoskeletal:     Cervical back: Neck supple.  Neurological:     Mental Status: She is alert and oriented to person, place, and time. Mental status is at baseline.     ED Results / Procedures / Treatments   Labs (all labs ordered are listed, but only abnormal results are displayed) Labs Reviewed  GROUP A STREP BY PCR - Abnormal; Notable for the following components:      Result Value   Group A Strep by PCR DETECTED (*)    All other components within normal limits  SARS CORONAVIRUS 2 BY RT PCR    EKG None  Radiology No results found.  Procedures Procedures    Medications Ordered in ED Medications  penicillin g benzathine (BICILLIN LA) 1200000 UNIT/2ML injection 1.2 Million Units (has no administration in time range)    ED Course/ Medical Decision Making/ A&P  Medical Decision Making  Patient is a 31 year old female with past medical history significant for gallstones and reflux and bipolar  She presents emergency room today with complaint of sore throat, dry cough, some diarrhea and myalgias for the past 4 days.  She denies any changes in her voice or difficulty breathing.  No chest pain.  No fevers at home.  No syncope or near syncope.  She states she has had strep throat 1 or 2 times in the past and feels similar.  Physical exam consistent with strep throat  Rapid strep test positive offered Bicillin versus penicillin p.o. for 10-day course she states she would prefer intramuscular Bicillin.  She will follow-up outpatient.  Return precautions discussed.  Doubt deep space infection, retropharyngeal or pain tonsillar abscess.    Final  Clinical Impression(s) / ED Diagnoses Final diagnoses:  Strep pharyngitis    Rx / DC Orders ED Discharge Orders     None         Gailen Shelter, [place] 05/22/22 8119    Zadie Rhine, MD 05/22/22 [phone]

## 2022-05-22 NOTE — Discharge Instructions (Signed)
You tested positive for strep pharyngitis this is also called strep throat.  Hydrate, alternate Tylenol and ibuprofen. Often pts start to feel improvement in 3-4 days.   Please use Tylenol or ibuprofen for pain.  You may use 600 mg ibuprofen every 6 hours or 1000 mg of Tylenol every 6 hours.  You may choose to alternate between the 2.  This would be most effective.  Not to exceed 4 g of Tylenol within 24 hours.  Not to exceed 3200 mg ibuprofen 24 hours.

## 2022-05-22 NOTE — ED Notes (Signed)
Patient verbalizes understanding of discharge instructions. Opportunity for questioning and answers were provided. Armband removed by staff, pt discharged from ED. Pt ambulatory to ED waiting room with steady gait.  

## 2022-08-04 ENCOUNTER — Emergency Department (HOSPITAL_COMMUNITY)
Admission: EM | Admit: 2022-08-04 | Discharge: 2022-08-04 | Disposition: A | Discharge status: Home / Self Care | Payer: Medicaid Other | Attending: Emergency Medicine | Admitting: Emergency Medicine

## 2022-08-04 ENCOUNTER — Other Ambulatory Visit: Payer: Self-pay

## 2022-08-04 ENCOUNTER — Encounter (HOSPITAL_COMMUNITY): Payer: Self-pay | Admitting: Emergency Medicine

## 2022-08-04 ENCOUNTER — Emergency Department (HOSPITAL_COMMUNITY): Payer: Medicaid Other

## 2022-08-04 DIAGNOSIS — R0781 Pleurodynia: Secondary | ICD-10-CM | POA: Insufficient documentation

## 2022-08-04 DIAGNOSIS — W19XXXA Unspecified fall, initial encounter: Secondary | ICD-10-CM | POA: Insufficient documentation

## 2022-08-04 LAB — HCG, SERUM, QUALITATIVE: Preg, Serum: NEGATIVE

## 2022-08-04 MED ORDER — CYCLOBENZAPRINE HCL 10 MG PO TABS: 10.0000 mg | ORAL_TABLET | Freq: Two times a day (BID) | ORAL | 0 refills | Status: DC | PRN

## 2022-08-04 NOTE — ED Provider Notes (Signed)
[place] EMERGENCY DEPARTMENT AT [facility] Provider Note   CSN: [id number] Arrival date & time: 08/04/22  1932     History Chief Complaint  Patient presents with   Rib Injury    Gina Hurley is a 32 y.o. female.  Patient presents to the emergency department concerns of right rib pain.  She reports this was sustained after a mechanical fall while intoxicated few days ago.  Reports lingering pain in the right ribs particularly under the right breast without radiation.  Has tried taking exercise Tylenol at home without significant improvement in symptoms.  Concern for possible pregnancy as she is sexually active and has missed her period.  Currently denies any nausea, vomiting, diarrhea, abdominal pain.  HPI     Home Medications Prior to Admission medications   Medication Sig Start Date End Date Taking? Authorizing Provider  cyclobenzaprine (FLEXERIL) 10 MG tablet Take 1 tablet (10 mg total) by mouth 2 (two) times daily as needed for muscle spasms. 08/04/22  Yes Smitty Knudsen, PA-C  ondansetron (ZOFRAN) 4 MG tablet Take 1 tablet (4 mg total) by mouth every 6 (six) hours as needed for nausea or vomiting. 02/17/21   Shon Hale, MD      Allergies    Shrimp [shellfish allergy] and Iodine    Review of Systems   Review of Systems  Musculoskeletal:        Rib pain  All other systems reviewed and are negative.   Physical Exam Updated Vital Signs BP 133/77 (BP Location: Right Arm)   Pulse 76   Temp 98.2 F (36.8 C) (Oral)   Resp 18   Ht 5\' 5"  (1.651 m)   Wt 113 kg   LMP 07/14/2022 (Exact Date)   SpO2 100%   BMI 41.46 kg/m  Physical Exam Vitals and nursing note reviewed.  Constitutional:      General: She is not in acute distress.    Appearance: She is well-developed.  HENT:     Head: Normocephalic and atraumatic.  Eyes:     Conjunctiva/sclera: Conjunctivae normal.  Cardiovascular:     Rate and Rhythm: Normal rate and regular rhythm.     Heart  sounds: No murmur heard. Pulmonary:     Effort: Pulmonary effort is normal. No respiratory distress.     Breath sounds: Normal breath sounds. No wheezing or rales.  Abdominal:     Palpations: Abdomen is soft.     Tenderness: There is no abdominal tenderness.  Musculoskeletal:        General: Tenderness present. No swelling, deformity or signs of injury.       Arms:     Cervical back: Neck supple.     Comments: Tenderness to palpation along the anterior aspect of the lower thoracic ribs.  No obvious bony deformity no bruising noted.  Skin:    General: Skin is warm and dry.     Capillary Refill: Capillary refill takes less than 2 seconds.  Neurological:     Mental Status: She is alert.  Psychiatric:        Mood and Affect: Mood normal.     ED Results / Procedures / Treatments   Labs (all labs ordered are listed, but only abnormal results are displayed) Labs Reviewed  HCG, SERUM, QUALITATIVE    EKG None  Radiology DG Ribs Unilateral W/Chest Right  Result Date: 08/04/2022 CLINICAL DATA:  Right-sided rib pain after fall EXAM: RIGHT RIBS AND CHEST - 3+ VIEW COMPARISON:  None  Available. FINDINGS: No fracture or other bone lesions are seen involving the ribs. There is no evidence of pneumothorax or pleural effusion. Both lungs are clear. Heart size and mediastinal contours are within normal limits. IMPRESSION: Negative. Electronically Signed   By: Minerva Fester M.D.   On: 08/04/2022 21:48    Procedures Procedures   Medications Ordered in ED Medications - No data to display  ED Course/ Medical Decision Making/ A&P                           Medical Decision Making Amount and/or Complexity of Data Reviewed Labs: ordered. Radiology: ordered.  Risk Prescription drug management.   This patient presents to the ED for concern of chest wall pain.  Differential diagnosis includes rib fracture, pneumothorax, PE, pneumonia, pleurisy   Lab Tests:  I Ordered, and personally  interpreted labs.  The pertinent results include: hCG negative   Imaging Studies ordered:  I ordered imaging studies including x-ray of right ribs/chest I independently visualized and interpreted imaging which showed no evidence of any acute fractures in the ribs, no evidence of acute cardiopulmonary disease process present I agree with the radiologist interpretation   Problem List / ED Course:  Patient presents emergency department concerns of a rib injury.  She reports that about 2 weeks ago she fell onto the concrete while intoxicated.  Has not experiencing pain in the right ribs since then.  Concerned about possible pregnancy at this time as well as her last menstrual period was on 6/23 and has had some light spotting since then without it full period.  On examination, patient does appear to have some tenderness to palpation over the right chest wall but no obvious discrepancies are noted concerning for rib injury.  No palpable crepitus no diminished lung sounds.  Will evaluate with x-ray imaging and also pregnancy test. X-ray imaging negative for any acute findings concerning for possible rib injury.  Patient is also not pregnant at this time.  Advised patient to manage symptoms at home with over-the-counter medication such as Tylenol, ibuprofen, Aleve.  Prescription for Flexeril was also sent to patient's pharmacy for further management of her symptoms that she noted some sensations of spasming to the ribs on the right side with movement at times.  Advised patient to follow-up with a primary care provider or return to the emergency department for further evaluation of concerns of poor symptom progression or worsening of symptoms.  No acute or emergent condition noted at this point requiring further evaluation or admission for further management.  Patient is agreeable to treatment plan verbalized understanding all return precautions.  All questions answered prior to patient discharge.  Patient  discharged home in good condition.   Social Determinants of Health:  No current primary care provider  Final Clinical Impression(s) / ED Diagnoses Final diagnoses:  Rib pain on right side  Fall, initial encounter    Rx / DC Orders ED Discharge Orders          Ordered    cyclobenzaprine (FLEXERIL) 10 MG tablet  2 times daily PRN        08/04/22 2249              Smitty Knudsen, PA-C 08/06/22 1317    Glyn Ade, MD 08/08/22 1456

## 2022-08-04 NOTE — ED Triage Notes (Signed)
Pt c/o right sided rib pain x 2 weeks after falling on concrete. Pt also c/o spotting, LMP was 6/23. States that she normally doesn't spot between menstrual cycles. Does not know if she is pregnant.

## 2022-08-04 NOTE — Discharge Instructions (Signed)
You were seen in the ER today for rib pain. Your xray was negative for any fractures or injuries to the ribs. There was also no signs of pneumonia or other lung condition. I have sent a prescription for Flexeril. If your symptoms are worsening, please return to the ER.

## 2022-10-01 ENCOUNTER — Emergency Department (HOSPITAL_COMMUNITY): Payer: Medicaid Other

## 2022-10-01 ENCOUNTER — Emergency Department (HOSPITAL_COMMUNITY)
Admission: EM | Admit: 2022-10-01 | Discharge: 2022-10-01 | Disposition: A | Discharge status: Home / Self Care | Payer: Medicaid Other | Attending: Emergency Medicine | Admitting: Emergency Medicine

## 2022-10-01 ENCOUNTER — Other Ambulatory Visit: Payer: Self-pay

## 2022-10-01 DIAGNOSIS — H9202 Otalgia, left ear: Secondary | ICD-10-CM | POA: Diagnosis not present

## 2022-10-01 DIAGNOSIS — Z20822 Contact with and (suspected) exposure to covid-19: Secondary | ICD-10-CM | POA: Diagnosis not present

## 2022-10-01 DIAGNOSIS — Y92480 Sidewalk as the place of occurrence of the external cause: Secondary | ICD-10-CM | POA: Diagnosis not present

## 2022-10-01 DIAGNOSIS — X501XXA Overexertion from prolonged static or awkward postures, initial encounter: Secondary | ICD-10-CM | POA: Insufficient documentation

## 2022-10-01 DIAGNOSIS — J029 Acute pharyngitis, unspecified: Secondary | ICD-10-CM | POA: Insufficient documentation

## 2022-10-01 DIAGNOSIS — M79672 Pain in left foot: Secondary | ICD-10-CM

## 2022-10-01 DIAGNOSIS — S9032XA Contusion of left foot, initial encounter: Secondary | ICD-10-CM | POA: Diagnosis not present

## 2022-10-01 DIAGNOSIS — S99922A Unspecified injury of left foot, initial encounter: Secondary | ICD-10-CM | POA: Diagnosis present

## 2022-10-01 LAB — RESP PANEL BY RT-PCR (RSV, FLU A&B, COVID)  RVPGX2
Influenza A by PCR: NEGATIVE
Influenza B by PCR: NEGATIVE
Resp Syncytial Virus by PCR: NEGATIVE
SARS Coronavirus 2 by RT PCR: NEGATIVE

## 2022-10-01 LAB — GROUP A STREP BY PCR: Group A Strep by PCR: NOT DETECTED

## 2022-10-01 MED ORDER — AMOXICILLIN-POT CLAVULANATE 875-125 MG PO TABS: 1.0000 | ORAL_TABLET | Freq: Two times a day (BID) | ORAL | 0 refills | Status: DC

## 2022-10-01 MED ORDER — METHYLPREDNISOLONE 4 MG PO TBPK: ORAL_TABLET | ORAL | 0 refills | Status: DC

## 2022-10-01 NOTE — Progress Notes (Signed)
Orthopedic Tech Progress Note Patient Details:  Gina Hurley 1990-09-03 [id number]  Ortho Devices Type of Ortho Device: Crutches, Postop shoe/boot Ortho Device/Splint Location: RLE Ortho Device/Splint Interventions: Ordered, Adjustment, Application   Post Interventions Patient Tolerated: Well Instructions Provided: Adjustment of device  Hassel Uphoff A Yaniyah Koors 10/01/2022, 7:43 PM

## 2022-10-01 NOTE — ED Triage Notes (Signed)
Patient reports stepping off of a sidewalk yesterday yesterday and having pain to left foot. Patient also c/o lymph node swollen to left side of neck that is affecting her left ear.

## 2022-10-01 NOTE — ED Notes (Signed)
Ortho tech contacted fpr post-op shoe nad crutch fitting

## 2022-10-01 NOTE — ED Provider Notes (Signed)
[place] EMERGENCY DEPARTMENT AT [facility] Provider Note   CSN: [id number] Arrival date & time: 10/01/22  1314     History  Chief Complaint  Patient presents with   Foot Injury   Otalgia    Gina Hurley is a 32 y.o. female.   Foot Injury Otalgia Associated symptoms: sore throat      32 year old female presenting to the Emergency Department with left foot pain.  She stepped off a sidewalk yesterday and twisted her foot and sustained pain right side of her left she also states that for the past few days she has had bilateral sore throat, some worsening pain in the left side of her neck with a swollen lymph node present.  She denies any fevers, chills, she has had a cough and congestion for the last 2 days.  She is tolerating oral intake, no known sick contacts.  She states that she has pain while ambulating about the base of the left great toe.  Home Medications Prior to Admission medications   Medication Sig Start Date End Date Taking? Authorizing Provider  amoxicillin-clavulanate (AUGMENTIN) 875-125 MG tablet Take 1 tablet by mouth every 12 (twelve) hours. 10/01/22  Yes Ernie Avena, MD  methylPREDNISolone (MEDROL DOSEPAK) 4 MG TBPK tablet Take as directed on the box 10/01/22  Yes Ernie Avena, MD  cyclobenzaprine (FLEXERIL) 10 MG tablet Take 1 tablet (10 mg total) by mouth 2 (two) times daily as needed for muscle spasms. 08/04/22   Smitty Knudsen, PA-C  ondansetron (ZOFRAN) 4 MG tablet Take 1 tablet (4 mg total) by mouth every 6 (six) hours as needed for nausea or vomiting. 02/17/21   Shon Hale, MD      Allergies    Shrimp [shellfish allergy] and Iodine    Review of Systems   Review of Systems  HENT:  Positive for sore throat.   Musculoskeletal:  Positive for arthralgias.  All other systems reviewed and are negative.   Physical Exam Updated Vital Signs BP (!) 153/99   Pulse 84   Temp 98 F (36.7 C) (Oral)   Resp 16   Ht 5\' 5"  (1.651 m)    Wt 111.1 kg   LMP 09/21/2022   SpO2 99%   BMI 40.77 kg/m  Physical Exam Vitals and nursing note reviewed.  Constitutional:      General: She is not in acute distress.    Appearance: She is well-developed.  HENT:     Head: Normocephalic and atraumatic.     Mouth/Throat:     Pharynx: Posterior oropharyngeal erythema present.     Tonsils: Tonsillar exudate present. No tonsillar abscesses. 2+ on the right. 2+ on the left.     Comments: Bilateral tonsillar exudate president, oral pharyngeal erythema Eyes:     Conjunctiva/sclera: Conjunctivae normal.  Cardiovascular:     Rate and Rhythm: Normal rate and regular rhythm.     Heart sounds: No murmur heard. Pulmonary:     Effort: Pulmonary effort is normal. No respiratory distress.     Breath sounds: Normal breath sounds.  Abdominal:     Palpations: Abdomen is soft.     Tenderness: There is no abdominal tenderness.  Musculoskeletal:        General: No swelling.     Cervical back: Neck supple.     Comments: Tenderness about the proximal left phalanx.  2+ DP pulses  Skin:    General: Skin is warm and dry.     Capillary Refill: Capillary refill  takes less than 2 seconds.  Neurological:     Mental Status: She is alert.  Psychiatric:        Mood and Affect: Mood normal.     ED Results / Procedures / Treatments   Labs (all labs ordered are listed, but only abnormal results are displayed) Labs Reviewed  GROUP A STREP BY PCR  RESP PANEL BY RT-PCR (RSV, FLU A&B, COVID)  [id number]    EKG None  Radiology DG Foot Complete Left  Result Date: 10/01/2022 CLINICAL DATA:  Foot pain, trauma. EXAM: LEFT FOOT - COMPLETE 3 VIEW COMPARISON:  None Available. FINDINGS: There is no evidence of fracture or dislocation. There is no evidence of arthropathy or other focal bone abnormality. Soft tissues are unremarkable. There is a plantar calcaneal spur. IMPRESSION: Plantar calcaneal spur.  No acute osseous abnormalities. Electronically Signed   By:  Layla Maw M.D.   On: 10/01/2022 15:26    Procedures Procedures    Medications Ordered in ED Medications - No data to display  ED Course/ Medical Decision Making/ A&P                                 Medical Decision Making Amount and/or Complexity of Data Reviewed Radiology: ordered.  Risk Prescription drug management.    32 year old female presenting to the Emergency Department with left foot pain.  She stepped off a sidewalk yesterday and twisted her foot and sustained pain right side of her left she also states that for the past few days she has had bilateral sore throat, some worsening pain in the left side of her neck with a swollen lymph node present.  She denies any fevers, chills, she has had a cough and congestion for the last 2 days.  She is tolerating oral intake, no known sick contacts.  She states that she has pain while ambulating about the base of the left great toe.   On arrival, the patient was vitally stable, afebrile, not tachycardic or tachypneic.  Physical exam revealed tenderness about the proximal left phalanx, 2+ DP pulses, no other signs of trauma.  Oral pharyngeal erythema and tonsillar exudate was present bilaterally.  Considered strep pharyngitis, viral infection, COVID-19, no fluctuance or obvious peritonsillar abscess.  Patient endorsing some unilateral nature however she is showing evidence of bilateral pharyngitis with exudate.  Will discharge her on a course of antibiotics, return precautions provided for worsening signs and symptoms of peritonsillar abscess.  X-ray imaging of the left foot was performed which revealed no evidence of fracture.  Given the patient's pain, she was placed in a postop shoe, crutches provided, advised NSAIDs and Tylenol for pain control, follow-up outpatient with sports medicine.   Final Clinical Impression(s) / ED Diagnoses Final diagnoses:  Left foot pain  Hematoma of left foot  Pharyngitis, unspecified etiology     Rx / DC Orders ED Discharge Orders          Ordered    AMB referral to sports medicine        10/01/22 1945    amoxicillin-clavulanate (AUGMENTIN) 875-125 MG tablet  Every 12 hours        10/01/22 1945    methylPREDNISolone (MEDROL DOSEPAK) 4 MG TBPK tablet        10/01/22 1949              Ernie Avena, MD 10/01/22 1949

## 2022-10-01 NOTE — ED Provider Triage Note (Signed)
Emergency Medicine Provider Triage Evaluation Note  Gina Hurley , a 32 y.o. female  was evaluated in triage.  Pt complains of left foot pain.  She accidentally stepped off a curb wrong yesterday and twisted her left foot.  No pain in the ankle.  No numbness or tingling.  Also complains of a sore throat, cough, and congestion for 2 days.  No known sick contacts.  Review of Systems  Positive: See HPI Negative: See HPI  Physical Exam  BP (!) 153/99   Pulse 84   Temp 98 F (36.7 C) (Oral)   Resp 16   Ht 5\' 5"  (1.651 m)   Wt 111.1 kg   LMP 09/21/2022   SpO2 99%   BMI 40.77 kg/m  Gen:   Awake, no distress   Resp:  Normal effort lungs clear to auscultation MSK:   Tenderness at the base of the left great toe, 2+ DP and PT pulses, normal sensation and capillary refill distally, no tenderness over the ankle, no obvious deformity Other:  Mild oropharyngeal erythema, no exudates, patent airway, no respiratory distress  Medical Decision Making  Medically screening exam initiated at 3:25 PM.  Appropriate orders placed.  Gina Hurley was informed that the remainder of the evaluation will be completed by another provider, this initial triage assessment does not replace that evaluation, and the importance of remaining in the ED until their evaluation is complete.     Tonette Lederer, PA-C 10/01/22 1540

## 2022-10-01 NOTE — Discharge Instructions (Addendum)
Your x-ray was negative for fracture. We have placed you in a post op shoe for comfort and provided crutches. Follow-up with sports medicine.  Recommend Tylenol and ibuprofen for pain control.

## 2022-12-13 ENCOUNTER — Emergency Department (HOSPITAL_COMMUNITY): Payer: Medicaid Other

## 2022-12-13 ENCOUNTER — Emergency Department (HOSPITAL_COMMUNITY)
Admission: EM | Admit: 2022-12-13 | Discharge: 2022-12-14 | Discharge status: Against Medical Advice | Payer: Medicaid Other | Attending: Emergency Medicine | Admitting: Emergency Medicine

## 2022-12-13 ENCOUNTER — Encounter (HOSPITAL_COMMUNITY): Payer: Self-pay

## 2022-12-13 DIAGNOSIS — R0789 Other chest pain: Secondary | ICD-10-CM | POA: Diagnosis not present

## 2022-12-13 DIAGNOSIS — R0602 Shortness of breath: Secondary | ICD-10-CM | POA: Diagnosis not present

## 2022-12-13 DIAGNOSIS — Z1152 Encounter for screening for COVID-19: Secondary | ICD-10-CM | POA: Diagnosis not present

## 2022-12-13 DIAGNOSIS — R059 Cough, unspecified: Secondary | ICD-10-CM | POA: Insufficient documentation

## 2022-12-13 DIAGNOSIS — R079 Chest pain, unspecified: Secondary | ICD-10-CM | POA: Diagnosis present

## 2022-12-13 DIAGNOSIS — R0981 Nasal congestion: Secondary | ICD-10-CM | POA: Insufficient documentation

## 2022-12-13 DIAGNOSIS — Z5321 Procedure and treatment not carried out due to patient leaving prior to being seen by health care provider: Secondary | ICD-10-CM | POA: Insufficient documentation

## 2022-12-13 LAB — RESP PANEL BY RT-PCR (RSV, FLU A&B, COVID)  RVPGX2
Influenza A by PCR: NEGATIVE
Influenza B by PCR: NEGATIVE
Resp Syncytial Virus by PCR: NEGATIVE
SARS Coronavirus 2 by RT PCR: NEGATIVE

## 2022-12-13 LAB — CBC
HCT: 36.4 % (ref 36.0–46.0)
Hemoglobin: 11.7 g/dL — ABNORMAL LOW (ref 12.0–15.0)
MCH: 27.1 pg (ref 26.0–34.0)
MCHC: 32.1 g/dL (ref 30.0–36.0)
MCV: 84.3 fL (ref 80.0–100.0)
Platelets: 434 10*3/uL — ABNORMAL HIGH (ref 150–400)
RBC: 4.32 MIL/uL (ref 3.87–5.11)
RDW: 15.5 % (ref 11.5–15.5)
WBC: 6.4 10*3/uL (ref 4.0–10.5)
nRBC: 0 % (ref 0.0–0.2)

## 2022-12-13 LAB — TROPONIN I (HIGH SENSITIVITY)
Troponin I (High Sensitivity): <2 ng/L (ref <18)
Troponin I (High Sensitivity): <2 ng/L (ref <18)

## 2022-12-13 LAB — BASIC METABOLIC PANEL
Anion gap: 9 (ref 5–15)
BUN: 8 mg/dL (ref 6–20)
CO2: 25 mmol/L (ref 22–32)
Calcium: 9.1 mg/dL (ref 8.9–10.3)
Chloride: 104 mmol/L (ref 98–111)
Creatinine, Ser: 0.78 mg/dL (ref 0.44–1.00)
GFR, Estimated: >60 mL/min (ref >60)
Glucose, Bld: 76 mg/dL (ref 70–99)
Potassium: 3.7 mmol/L (ref 3.5–5.1)
Sodium: 138 mmol/L (ref 135–145)

## 2022-12-13 LAB — HCG, SERUM, QUALITATIVE: Preg, Serum: NEGATIVE

## 2022-12-13 NOTE — ED Provider Triage Note (Cosign Needed)
Emergency Medicine Provider Triage Evaluation Note  Gina Hurley , a 32 y.o. female  was evaluated in triage.  Pt complains of chest pain.  Started 4 days ago.  Primarily the right chest radiates to the center of her chest and at times to her back.  Also endorsing a cough and nasal congestion with intermittent shortness of breath.  Denies fever.  Review of Systems  Positive: See above Negative: See above  Physical Exam  There were no vitals taken for this visit. Gen:   Awake, no distress   Resp:  Normal effort  MSK:   Moves extremities without difficulty  Other:    Medical Decision Making  Medically screening exam initiated at 6:14 PM.  Appropriate orders placed.  Gina Hurley was informed that the remainder of the evaluation will be completed by another provider, this initial triage assessment does not replace that evaluation, and the importance of remaining in the ED until their evaluation is complete.  Work up started   Gareth Eagle, PA-C 12/13/22 1816

## 2022-12-13 NOTE — ED Triage Notes (Signed)
Pt is coming in with congestion/cough/ and some right sided chest pain that radiates to the middle for the chest. Has been going on for around 3 to 4 days. She reports no fevers at home and is otherwise stable at this time.

## 2022-12-20 ENCOUNTER — Other Ambulatory Visit: Payer: Self-pay

## 2022-12-20 ENCOUNTER — Emergency Department (HOSPITAL_COMMUNITY): Payer: Medicaid Other

## 2022-12-20 ENCOUNTER — Emergency Department (HOSPITAL_COMMUNITY)
Admission: EM | Admit: 2022-12-20 | Discharge: 2022-12-20 | Disposition: A | Discharge status: Home / Self Care | Payer: Medicaid Other | Attending: Emergency Medicine | Admitting: Emergency Medicine

## 2022-12-20 ENCOUNTER — Encounter (HOSPITAL_COMMUNITY): Payer: Self-pay

## 2022-12-20 DIAGNOSIS — M5412 Radiculopathy, cervical region: Secondary | ICD-10-CM | POA: Insufficient documentation

## 2022-12-20 DIAGNOSIS — R21 Rash and other nonspecific skin eruption: Secondary | ICD-10-CM | POA: Diagnosis not present

## 2022-12-20 DIAGNOSIS — M25512 Pain in left shoulder: Secondary | ICD-10-CM

## 2022-12-20 LAB — POC URINE PREG, ED: Preg Test, Ur: NEGATIVE

## 2022-12-20 MED ORDER — KETOROLAC TROMETHAMINE 15 MG/ML IJ SOLN
15.0000 mg | Freq: Once | INTRAMUSCULAR | Status: AC
  Administered 2022-12-20: 15 mg via INTRAVENOUS
  Filled 2022-12-20: qty 1

## 2022-12-20 MED ORDER — LIDOCAINE 5 % EX PTCH
1.0000 | MEDICATED_PATCH | CUTANEOUS | Status: DC
  Administered 2022-12-20: 1 via TRANSDERMAL
  Filled 2022-12-20: qty 1

## 2022-12-20 MED ORDER — METHOCARBAMOL 500 MG PO TABS: 500.0000 mg | ORAL_TABLET | Freq: Two times a day (BID) | ORAL | 0 refills | Status: DC | PRN

## 2022-12-20 NOTE — Discharge Instructions (Addendum)
You were seen in the ER today for evaluation of your left shoulder pain and rash. Please make sure you follow up with a primary care provider about your rash in the next few days as well as your shoulder pain. For pain, I recommend taking 1000mg  of Tylenol and 600mg  of ibuprofen every 6 hours as needed for pain. I am also prescribing you a muscle relaxer to take as needed. Please do not drive or operate any heavy machinery while on this medication as it will make you sleepy. I have included additional information into the discharge paperwork. Please review. If you have any concerns, new or worsening symptoms, please return to the ER for re-evaluation.   I have listed the information for a dermatologist and orthopedic provider for you to follow up with. Please call to schedule an appointment.   Contact a health care provider if: You sweat at night more than normal. You pee (urinate) more or less than normal, or your pee is a darker color than normal. Your eyes become sensitive to light. Your skin or the white parts of your eyes turn yellow (jaundice). Your skin tingles or is numb. You get painful blisters in your nose or mouth. Your rash does not go away after a few days, or it gets worse. You are more tired or thirsty than normal. You have new or worse symptoms. These may include: Pain in your abdomen. Fever. Diarrhea or vomiting. Weakness or weight loss. Get help right away if: You get confused. You have a severe headache, a stiff neck, or severe joint pain or stiffness. You become very sleepy or not responsive. You have a seizure.

## 2022-12-20 NOTE — ED Provider Notes (Signed)
  [place] EMERGENCY DEPARTMENT AT [facility] Provider Note   CSN: [id number] Arrival date & time: 12/20/22  1424     History {Add pertinent medical, surgical, social history, OB history to HPI:1} Chief Complaint  Patient presents with   Shoulder Pain   Tingling   Rash    Gina Hurley is a 32 y.o. female.   Shoulder Pain Rash      Home Medications Prior to Admission medications   Medication Sig Start Date End Date Taking? Authorizing Provider  amoxicillin-clavulanate (AUGMENTIN) 875-125 MG tablet Take 1 tablet by mouth every 12 (twelve) hours. 10/01/22   Ernie Avena, MD  cyclobenzaprine (FLEXERIL) 10 MG tablet Take 1 tablet (10 mg total) by mouth 2 (two) times daily as needed for muscle spasms. 08/04/22   Smitty Knudsen, PA-C  methylPREDNISolone (MEDROL DOSEPAK) 4 MG TBPK tablet Take as directed on the box 10/01/22   Ernie Avena, MD  ondansetron (ZOFRAN) 4 MG tablet Take 1 tablet (4 mg total) by mouth every 6 (six) hours as needed for nausea or vomiting. 02/17/21   Shon Hale, MD      Allergies    Shrimp [shellfish allergy] and Iodine    Review of Systems   Review of Systems  Skin:  Positive for rash.    Physical Exam Updated Vital Signs BP (!) 130/90   Pulse 78   Temp 98.1 F (36.7 C)   Resp 12   SpO2 100%  Physical Exam  ED Results / Procedures / Treatments   Labs (all labs ordered are listed, but only abnormal results are displayed) Labs Reviewed - No data to display  EKG None  Radiology No results found.  Procedures Procedures  {Document cardiac monitor, telemetry assessment procedure when appropriate:1}  Medications Ordered in ED Medications - No data to display  ED Course/ Medical Decision Making/ A&P   {   Click here for ABCD2, HEART and other calculatorsREFRESH Note before signing :1}                              Medical Decision Making  ***  {Document critical care time when appropriate:1} {Document  review of labs and clinical decision tools ie heart score, Chads2Vasc2 etc:1}  {Document your independent review of radiology images, and any outside records:1} {Document your discussion with family members, caretakers, and with consultants:1} {Document social determinants of health affecting pt's care:1} {Document your decision making why or why not admission, treatments were needed:1} Final Clinical Impression(s) / ED Diagnoses Final diagnoses:  None    Rx / DC Orders ED Discharge Orders     None

## 2022-12-20 NOTE — ED Triage Notes (Signed)
Pt c.o left shoulder pain and right hand numbness/tingling. Pt also c.o rash to both her arms. Pt was seen here last week for chest pain and LWBS after triage.

## 2022-12-20 NOTE — ED Provider Triage Note (Signed)
Emergency Medicine Provider Triage Evaluation Note  Gina Hurley , a 32 y.o. female  was evaluated in triage.  Pt complains of left shoulder pain and right hand numbness/tingling.  She also noticed red spots on her left forearm which have spread to her right forearm.  Denies injury to her upper extremities.   Review of Systems  Positive: As above Negative: As above  Physical Exam  There were no vitals taken for this visit. Gen:   Awake, no distress   Resp:  Normal effort  MSK:   Moves extremities without difficulty  Other:  Small, red papules to bilateral forearms  Medical Decision Making  Medically screening exam initiated at 2:34 PM.  Appropriate orders placed.  Gina Hurley was informed that the remainder of the evaluation will be completed by another provider, this initial triage assessment does not replace that evaluation, and the importance of remaining in the ED until their evaluation is complete.     Lenard Simmer, PA-C 12/20/22 1436

## 2023-01-24 ENCOUNTER — Other Ambulatory Visit: Payer: Self-pay

## 2023-01-24 ENCOUNTER — Emergency Department (HOSPITAL_COMMUNITY)
Admission: EM | Admit: 2023-01-24 | Discharge: 2023-01-24 | Disposition: A | Discharge status: Home / Self Care | Payer: Medicaid Other | Attending: Emergency Medicine | Admitting: Emergency Medicine

## 2023-01-24 ENCOUNTER — Encounter (HOSPITAL_COMMUNITY): Payer: Self-pay

## 2023-01-24 DIAGNOSIS — Z3A01 Less than 8 weeks gestation of pregnancy: Secondary | ICD-10-CM | POA: Diagnosis not present

## 2023-01-24 DIAGNOSIS — Z3490 Encounter for supervision of normal pregnancy, unspecified, unspecified trimester: Secondary | ICD-10-CM

## 2023-01-24 DIAGNOSIS — R300 Dysuria: Secondary | ICD-10-CM | POA: Diagnosis not present

## 2023-01-24 DIAGNOSIS — O219 Vomiting of pregnancy, unspecified: Secondary | ICD-10-CM | POA: Diagnosis present

## 2023-01-24 DIAGNOSIS — O209 Hemorrhage in early pregnancy, unspecified: Secondary | ICD-10-CM | POA: Insufficient documentation

## 2023-01-24 DIAGNOSIS — R112 Nausea with vomiting, unspecified: Secondary | ICD-10-CM

## 2023-01-24 LAB — COMPREHENSIVE METABOLIC PANEL
ALT: 10 U/L (ref 0–44)
AST: 16 U/L (ref 15–41)
Albumin: 3.8 g/dL (ref 3.5–5.0)
Alkaline Phosphatase: 61 U/L (ref 38–126)
Anion gap: 9 (ref 5–15)
BUN: 9 mg/dL (ref 6–20)
CO2: 23 mmol/L (ref 22–32)
Calcium: 9.8 mg/dL (ref 8.9–10.3)
Chloride: 105 mmol/L (ref 98–111)
Creatinine, Ser: 0.61 mg/dL (ref 0.44–1.00)
GFR, Estimated: >60 mL/min (ref >60)
Glucose, Bld: 131 mg/dL — ABNORMAL HIGH (ref 70–99)
Potassium: 4.3 mmol/L (ref 3.5–5.1)
Sodium: 137 mmol/L (ref 135–145)
Total Bilirubin: 0.4 mg/dL (ref 0.0–1.2)
Total Protein: 7.9 g/dL (ref 6.5–8.1)

## 2023-01-24 LAB — URINALYSIS, ROUTINE W REFLEX MICROSCOPIC
Bilirubin Urine: NEGATIVE
Glucose, UA: NEGATIVE mg/dL
Hgb urine dipstick: NEGATIVE
Ketones, ur: 20 mg/dL — AB
Nitrite: NEGATIVE
Protein, ur: 30 mg/dL — AB
Specific Gravity, Urine: 1.031 — ABNORMAL HIGH (ref 1.005–1.030)
pH: 5 (ref 5.0–8.0)

## 2023-01-24 LAB — CBC
HCT: 37.9 % (ref 36.0–46.0)
Hemoglobin: 13 g/dL (ref 12.0–15.0)
MCH: 28.4 pg (ref 26.0–34.0)
MCHC: 34.3 g/dL (ref 30.0–36.0)
MCV: 82.9 fL (ref 80.0–100.0)
Platelets: 450 10*3/uL — ABNORMAL HIGH (ref 150–400)
RBC: 4.57 MIL/uL (ref 3.87–5.11)
RDW: 14.6 % (ref 11.5–15.5)
WBC: 10.6 10*3/uL — ABNORMAL HIGH (ref 4.0–10.5)
nRBC: 0 % (ref 0.0–0.2)

## 2023-01-24 LAB — CBG MONITORING, ED: Glucose-Capillary: 78 mg/dL (ref 70–99)

## 2023-01-24 LAB — LIPASE, BLOOD: Lipase: 34 U/L (ref 11–51)

## 2023-01-24 LAB — HCG, SERUM, QUALITATIVE: Preg, Serum: POSITIVE — AB

## 2023-01-24 LAB — HCG, QUANTITATIVE, PREGNANCY: hCG, Beta Chain, Quant, S: 108955 m[IU]/mL — ABNORMAL HIGH (ref <5)

## 2023-01-24 MED ORDER — ONDANSETRON HCL 4 MG PO TABS: 4.0000 mg | ORAL_TABLET | Freq: Four times a day (QID) | ORAL | 0 refills | Status: DC

## 2023-01-24 MED ORDER — ONDANSETRON 4 MG PO TBDP
4.0000 mg | ORAL_TABLET | Freq: Once | ORAL | Status: AC
  Administered 2023-01-24: 4 mg via ORAL
  Filled 2023-01-24: qty 1

## 2023-01-24 NOTE — ED Triage Notes (Signed)
 Pt states took pregnancy test at home, positive, states she wants to see how far along she is; endorses  n/v x several days; denies fevers; LMP November

## 2023-01-24 NOTE — Discharge Instructions (Addendum)
 Please follow-up with the OB/GYN in regards recent ER visit.  Today your labs are reassuring however it does appear that you did test positive for pregnancy which is most likely causing her nausea vomiting.  I have prescribed you Zofran  to help with your nausea at home.  Please remain hydrated and eat food as tolerated.  If symptoms change or worsen please go to the [facility] as they are in ER for pregnant patients.

## 2023-01-24 NOTE — ED Provider Notes (Signed)
 [place] EMERGENCY DEPARTMENT AT [facility] Provider Note   CSN: [id number] Arrival date & time: 01/24/23  1153     History  Chief Complaint  Patient presents with   Nausea   Emesis    Gina Hurley is a 33 y.o. female history of GERD, bipolar 1, G2, P2 presented with nausea vomiting for the past 3 days.  Patient has any fevers, abdominal pain, vaginal bleeding or discharge or dysuria or hematuria.  Patient states that she is unsure whether or not she is pregnant.  Patient has chest pain shortness of breath.  Patient that she does not feel she has been able to keep much food or fluid down recently.  [facility]  LMP 11/22/2022.  Home Medications Prior to Admission medications   Medication Sig Start Date End Date Taking? Authorizing Provider  ondansetron  (ZOFRAN ) 4 MG tablet Take 1 tablet (4 mg total) by mouth every 6 (six) hours. 01/24/23  Yes Victor Lynwood DASEN, PA-C  amoxicillin -clavulanate (AUGMENTIN ) 875-125 MG tablet Take 1 tablet by mouth every 12 (twelve) hours. 10/01/22   Jerrol Lynwood, MD  methocarbamol  (ROBAXIN ) 500 MG tablet Take 1 tablet (500 mg total) by mouth 2 (two) times daily as needed for muscle spasms. 12/20/22   Bernis Ernst, PA-C  methylPREDNISolone  (MEDROL  DOSEPAK) 4 MG TBPK tablet Take as directed on the box 10/01/22   Jerrol Lynwood, MD      Allergies    Shrimp [shellfish allergy] and Iodine    Review of Systems   Review of Systems  Gastrointestinal:  Positive for vomiting.    Physical Exam Updated Vital Signs BP 109/61 (BP Location: Left Arm)   Pulse 77   Temp 98.3 F (36.8 C) (Oral)   Resp 18   Ht 5' 5 (1.651 m)   Wt 86.2 kg   LMP 11/22/2022 (Approximate)   SpO2 100%   BMI 31.62 kg/m  Physical Exam Vitals reviewed.  Constitutional:      General: She is not in acute distress.    Comments: Eating graham crackers and drinking water on exam  HENT:     Head: Normocephalic and atraumatic.  Eyes:      Extraocular Movements: Extraocular movements intact.     Conjunctiva/sclera: Conjunctivae normal.     Pupils: Pupils are equal, round, and reactive to light.  Cardiovascular:     Rate and Rhythm: Normal rate and regular rhythm.     Pulses: Normal pulses.     Heart sounds: Normal heart sounds.     Comments: 2+ bilateral radial/dorsalis pedis pulses with regular rate Pulmonary:     Effort: Pulmonary effort is normal. No respiratory distress.     Breath sounds: Normal breath sounds.  Abdominal:     Palpations: Abdomen is soft.     Tenderness: There is no abdominal tenderness. There is no guarding or rebound.  Musculoskeletal:        General: Normal range of motion.     Cervical back: Normal range of motion and neck supple.     Comments: 5 out of 5 bilateral grip/leg extension strength  Skin:    General: Skin is warm and dry.     Capillary Refill: Capillary refill takes less than 2 seconds.  Neurological:     General: No focal deficit present.     Mental Status: She is alert and oriented to person, place, and time.     Comments: Sensation intact in all 4 limbs  Psychiatric:        Mood and Affect: Mood normal.     ED Results / Procedures / Treatments   Labs (all labs ordered are listed, but only abnormal results are displayed) Labs Reviewed  COMPREHENSIVE METABOLIC PANEL - Abnormal; Notable for the following components:      Result Value   Glucose, Bld 131 (*)    All other components within normal limits  CBC - Abnormal; Notable for the following components:   WBC 10.6 (*)    Platelets 450 (*)    All other components within normal limits  URINALYSIS, ROUTINE W REFLEX MICROSCOPIC - Abnormal; Notable for the following components:   APPearance TURBID (*)    Specific Gravity, Urine 1.031 (*)    Ketones, ur 20 (*)    Protein, ur 30 (*)    Leukocytes,Ua MODERATE (*)    Bacteria, UA MANY (*)    All other components within normal limits  HCG, SERUM, QUALITATIVE - Abnormal;  Notable for the following components:   Preg, Serum POSITIVE (*)    All other components within normal limits  HCG, QUANTITATIVE, PREGNANCY - Abnormal; Notable for the following components:   hCG, Beta Chain, Quant, S [id number] (*)    All other components within normal limits  URINE CULTURE  LIPASE, BLOOD  CBG MONITORING, ED    EKG None  Radiology No results found.  Procedures Procedures    Medications Ordered in ED Medications  ondansetron  (ZOFRAN -ODT) disintegrating tablet 4 mg (4 mg Oral Given 01/24/23 1946)    ED Course/ Medical Decision Making/ A&P                                 Medical Decision Making Amount and/or Complexity of Data Reviewed Labs: ordered.  Risk Prescription drug management.   JAMETTA Hurley 33 y.o. presented today for nausea vomiting.  Working DDx that I considered at this time includes, but not limited to, gastroenteritis, colitis, small bowel obstruction, appendicitis, cholecystitis, hepatobiliary pathology, gastritis, PUD, ACS, aortic dissection, diverticulosis/diverticulitis, pancreatitis, nephrolithiasis, medication induced, AAA, UTI, pyelonephritis, ruptured ectopic pregnancy, PID, ovarian torsion.  R/o DDx: gastroenteritis, colitis, small bowel obstruction, appendicitis, cholecystitis, hepatobiliary pathology, gastritis, PUD, ACS, aortic dissection, diverticulosis/diverticulitis, pancreatitis, nephrolithiasis, medication induced, AAA, UTI, pyelonephritis, ruptured ectopic pregnancy, PID, ovarian torsion: These are considered less likely due to history of present illness, physical exam, labs/imaging findings.  Review of prior external notes: 12/20/2022 ED  Unique Tests and My Interpretation:  CBC with differential: Unremarkable CMP: Unremarkable Lipase: Unremarkable UA: Squamous epithelial cells present Serum pregnancy: Positive Serum quantitative: 108,955  Social Determinants of Health: none  Discussion with Independent Historian:   Significant other  Discussion of Management of Tests: None  Risk: Medium: prescription drug management  Risk Stratification Score: None  Plan: On exam patient was in no acute distress stable vitals.  Patient's exam is unremarkable.  Labs from triage do show that patient is pregnant but otherwise had unremarkable labs.  Patient's urine does have squamous epithelial cells present and patient is not endorsing any urinary symptoms and so with the p.o. cells will not treat for UTI and will send urine for culture.  Patient is not endorsing any vaginal bleeding or vaginal symptoms and so at this time do not need type and screen along with Rh or ultrasound.  Will see if patient can tolerate p.o. and will most likely discharge with Zofran  and OB/GYN follow-up.  Upon  chart review it does appear patient is Rh+ for future reference.  Patient passed p.o. challenge and is asking to be discharged at this time.  The patient passed p.o. challenge and labs being reassuring do feel this is reasonable and will prescribe Zofran  have her follow-up with her OB/GYN.  Patient was given return precautions. Patient stable for discharge at this time.  Patient verbalized understanding of plan.  This chart was dictated using voice recognition software.  Despite best efforts to proofread,  errors can occur which can change the documentation meaning.         Final Clinical Impression(s) / ED Diagnoses Final diagnoses:  Pregnancy, unspecified gestational age  Nausea and vomiting, unspecified vomiting type    Rx / DC Orders ED Discharge Orders          Ordered    ondansetron  (ZOFRAN ) 4 MG tablet  Every 6 hours        01/24/23 1941              Victor Lynwood ONEIDA DEVONNA 01/24/23 2008    Yolande Lamar BROCKS, MD 01/26/23 [phone]

## 2023-01-24 NOTE — ED Notes (Signed)
 Verbal consent given for MSE

## 2023-01-24 NOTE — ED Provider Triage Note (Signed)
 Emergency Medicine Provider Triage Evaluation Note  Gina Hurley , a 33 y.o. female  was evaluated in triage.  Pt complains of nausea and positive pregnancy test at home.  Review of Systems  Positive: Nausea Negative: Vaginal bleeding, vaginal discharge, abdominal pain, back pain  Physical Exam  BP 121/83 (BP Location: Right Arm)   Pulse (!) 114   Temp 98.5 F (36.9 C) (Oral)   Resp 18   Ht 5' 5 (1.651 m)   Wt 86.2 kg   LMP 11/22/2022 (Approximate)   SpO2 100%   BMI 31.62 kg/m  Gen:   Awake, no distress   Resp:  Normal effort  MSK:   Moves extremities without difficulty  Other:    Medical Decision Making  Medically screening exam initiated at 2:45 PM.  Appropriate orders placed.  AMALIE KORAN was informed that the remainder of the evaluation will be completed by another provider, this initial triage assessment does not replace that evaluation, and the importance of remaining in the ED until their evaluation is complete.  Patient reporting to emergency room.  Reports she has had nausea and vomiting for the past few days.  Reports she had positive pregnancy test at home.  Last menstrual period in early November.  Has not had any abdominal pain, dysuria, chest pain, shortness of breath or vaginal bleeding.  Patient has had 3 prior pregnancies none of which she had felt this nauseous with.   Shermon Warren LOISE, PA-C 01/24/23 1446

## 2023-01-27 LAB — URINE CULTURE: Culture: >=100000 — AB

## 2023-01-28 ENCOUNTER — Telehealth (HOSPITAL_BASED_OUTPATIENT_CLINIC_OR_DEPARTMENT_OTHER): Payer: Self-pay | Admitting: *Deleted

## 2023-01-28 ENCOUNTER — Telehealth (HOSPITAL_BASED_OUTPATIENT_CLINIC_OR_DEPARTMENT_OTHER): Payer: Self-pay

## 2023-01-28 NOTE — Telephone Encounter (Signed)
 Post ED Visit - Positive Culture Follow-up: Successful Patient Follow-Up  Culture assessed and recommendations reviewed by:  []  Rankin Dee, Pharm.D. []  Venetia Gully, Pharm.D., BCPS AQ-ID []  Garrel Crews, Pharm.D., BCPS []  Almarie Lunger, Pharm.D., BCPS []  [place], [address].D., BCPS, AAHIVP []  Rosaline Bihari, Pharm.D., BCPS, AAHIVP []  Vernell Meier, PharmD, BCPS []  Latanya Hint, PharmD, BCPS []  Donald Medley, PharmD, BCPS []  Rocky Bold, PharmD OLEGARIO Gaines Carrier PharmD  Positive urine culture  [x]  Patient discharged without antimicrobial prescription and treatment is now indicated []  Organism is resistant to prescribed ED discharge antimicrobial []  Patient with positive blood cultures  Changes discussed with ED provider: [name] PA New antibiotic prescription Keflex  500 mg QID x 5d Called to   Contacted patient, date 10/28/23, time 1013  LVM requesting callback.   Gretta Avelina Buss 01/28/2023, 10:10 AM

## 2023-01-28 NOTE — Telephone Encounter (Signed)
 Post ED Visit - Positive Culture Follow-up: Successful Patient Follow-Up  Culture assessed and recommendations reviewed by:  [x]  Dorn Poot Pharm.D. []  Venetia Gully, Pharm.D., BCPS AQ-ID []  Garrel Crews, Pharm.D., BCPS []  Almarie Lunger, Pharm.D., BCPS []  [place], [address].D., BCPS, AAHIVP []  Rosaline Bihari, Pharm.D., BCPS, AAHIVP []  Vernell Meier, PharmD, BCPS []  Latanya Hint, PharmD, BCPS []  Donald Medley, PharmD, BCPS []  Rocky Bold, PharmD  Positive urine culture  [x]  Patient discharged without antimicrobial prescription and treatment is now indicated []  Organism is resistant to prescribed ED discharge antimicrobial []  Patient with positive blood cultures  Changes discussed with ED provider: [name] New antibiotic prescription Keflex  500 mg QID x 5 days Called to [facility] at [phone]  Contacted patient, date 02/07/23, time 1030   Gina Hurley [place]erto 01/28/2023, 10:30 AM

## 2023-01-28 NOTE — Progress Notes (Signed)
 ED Antimicrobial Stewardship Positive Culture Follow Up   Gina Hurley is an 33 y.o. female who presented to [facility] on 01/24/2023 with a chief complaint of  Chief Complaint  Patient presents with   Nausea   Emesis    Recent Results (from the past 720 hours)  Urine Culture     Status: Abnormal   Collection Time: 01/24/23  7:36 PM   Specimen: Urine, Clean Catch  Result Value Ref Range Status   Specimen Description URINE, CLEAN CATCH  Final   Special Requests   Final    NONE Performed at [facility], [address] [place], [place] [address]    Culture >=100,000 COLONIES/mL ESCHERICHIA COLI (A)  Final   Report Status 01/27/2023 FINAL  Final   Organism ID, Bacteria ESCHERICHIA COLI (A)  Final      Susceptibility   Escherichia coli - MIC*    AMPICILLIN >=32 RESISTANT Resistant     CEFAZOLIN  <=4 SENSITIVE Sensitive     CEFEPIME <=0.12 SENSITIVE Sensitive     CEFTRIAXONE  <=0.25 SENSITIVE Sensitive     CIPROFLOXACIN <=0.25 SENSITIVE Sensitive     GENTAMICIN <=1 SENSITIVE Sensitive     IMIPENEM <=0.25 SENSITIVE Sensitive     NITROFURANTOIN <=16 SENSITIVE Sensitive     TRIMETH /SULFA  <=20 SENSITIVE Sensitive     AMPICILLIN/SULBACTAM 16 INTERMEDIATE Intermediate     PIP/TAZO <=4 SENSITIVE Sensitive ug/mL    * >=100,000 COLONIES/mL ESCHERICHIA COLI     [x]  Patient discharged originally without antimicrobial agent and treatment is now indicated  New antibiotic prescription: Keflex   ED Provider: Susette Lash, PA-C   Dorn Poot 01/28/2023, 8:02 AM Clinical Pharmacist Monday - Friday phone -  [phone] Saturday - Sunday phone - [phone]

## 2023-02-25 ENCOUNTER — Telehealth: Payer: Medicaid Other | Admitting: *Deleted

## 2023-02-25 DIAGNOSIS — Z3687 Encounter for antenatal screening for uncertain dates: Secondary | ICD-10-CM | POA: Diagnosis not present

## 2023-02-25 DIAGNOSIS — Z3490 Encounter for supervision of normal pregnancy, unspecified, unspecified trimester: Secondary | ICD-10-CM | POA: Diagnosis not present

## 2023-02-25 DIAGNOSIS — F319 Bipolar disorder, unspecified: Secondary | ICD-10-CM | POA: Diagnosis not present

## 2023-02-25 DIAGNOSIS — O0990 Supervision of high risk pregnancy, unspecified, unspecified trimester: Secondary | ICD-10-CM | POA: Insufficient documentation

## 2023-02-25 NOTE — Progress Notes (Signed)
 New OB Intake  I connected with Gina Hurley  on 02/25/23 at  8:15 AM EST by MyChart Video Visit and verified that I am speaking with the correct person using two identifiers. Nurse is located at [facility] and pt is located at home.  I discussed the limitations, risks, security and privacy concerns of performing an evaluation and management service by telephone and the availability of in person appointments. I also discussed with the patient that there may be a patient responsible charge related to this service. The patient expressed understanding and agreed to proceed.  I explained I am completing New OB Intake today. We discussed EDD not yet determined. She reports irregular periods with approximate LMP of 11/22/22. Dating US  ordered. Pt is [id number]. I reviewed her allergies, medications and Medical/Surgical/OB history.    Patient Active Problem List   Diagnosis Date Noted   Supervision of low-risk pregnancy 02/25/2023   Bipolar 1 disorder (HCC)     Concerns addressed today  Delivery Plans Plans to deliver at [facility]. Discussed the nature of our practice with multiple providers including residents and students. Due to the size of the practice, the delivering provider may not be the same as those providing prenatal care.   Patient is not interested in water birth.   MyChart/Babyscripts MyChart access verified. I explained pt will have some visits in office and some virtually. Babyscripts instructions given and will be sent to her at new ob visit once EDD determined.   Blood Pressure Cuff/Weight Scale States she and her husband have a BP cuff.  Explained after first prenatal appt pt will check weekly and document in Babyscripts. Patient does have weight scale.  Anatomy US  Explained nexrt scheduled US  will be around 19 weeks. Anatomy US  will be scheduled at new ob visit once EDD determined after dating US .   Is patient a CenteringPregnancy candidate?  Declined Declined due to Group  setting   Is patient a Mom+Baby Combined Care candidate?  Not a candidate    Interested in [place]?  Declines   Is patient a candidate for Babyscripts Optimization? Not determined   First visit review I reviewed new OB appt with patient. Explained pt will be seen by Dr. Izell at first visit. Discussed [place] genetic screening with patient. She is undecided about Pan[facility] and Horizon.. Routine prenatal labs needed at new ob visit.    Last Pap No results found for: EDMON Rock Skip OBIE 02/25/2023  8:55 AM

## 2023-02-27 ENCOUNTER — Ambulatory Visit (HOSPITAL_COMMUNITY)
Admission: RE | Admit: 2023-02-27 | Discharge: 2023-02-27 | Disposition: A | Discharge status: Home / Self Care | Payer: Medicaid Other | Source: Ambulatory Visit | Attending: Obstetrics and Gynecology | Admitting: Obstetrics and Gynecology

## 2023-02-27 ENCOUNTER — Other Ambulatory Visit: Payer: Self-pay | Admitting: Obstetrics and Gynecology

## 2023-02-27 DIAGNOSIS — Z3687 Encounter for antenatal screening for uncertain dates: Secondary | ICD-10-CM

## 2023-02-27 DIAGNOSIS — Z3490 Encounter for supervision of normal pregnancy, unspecified, unspecified trimester: Secondary | ICD-10-CM

## 2023-02-27 DIAGNOSIS — F319 Bipolar disorder, unspecified: Secondary | ICD-10-CM

## 2023-03-05 ENCOUNTER — Encounter: Payer: Self-pay | Admitting: Obstetrics and Gynecology

## 2023-03-05 ENCOUNTER — Telehealth: Payer: Self-pay

## 2023-03-05 ENCOUNTER — Ambulatory Visit (INDEPENDENT_AMBULATORY_CARE_PROVIDER_SITE_OTHER): Payer: Medicaid Other | Admitting: Obstetrics and Gynecology

## 2023-03-05 ENCOUNTER — Other Ambulatory Visit (HOSPITAL_COMMUNITY)
Admission: RE | Admit: 2023-03-05 | Discharge: 2023-03-05 | Disposition: A | Discharge status: Home / Self Care | Payer: Medicaid Other | Source: Ambulatory Visit | Attending: Obstetrics and Gynecology | Admitting: Obstetrics and Gynecology

## 2023-03-05 VITALS — BP 121/70 | HR 88 | Wt 231.4 lb

## 2023-03-05 DIAGNOSIS — O0992 Supervision of high risk pregnancy, unspecified, second trimester: Secondary | ICD-10-CM

## 2023-03-05 DIAGNOSIS — O99210 Obesity complicating pregnancy, unspecified trimester: Secondary | ICD-10-CM | POA: Diagnosis not present

## 2023-03-05 DIAGNOSIS — F319 Bipolar disorder, unspecified: Secondary | ICD-10-CM

## 2023-03-05 DIAGNOSIS — Z3A14 14 weeks gestation of pregnancy: Secondary | ICD-10-CM

## 2023-03-05 DIAGNOSIS — Z6839 Body mass index (BMI) 39.0-39.9, adult: Secondary | ICD-10-CM | POA: Diagnosis not present

## 2023-03-05 DIAGNOSIS — O10919 Unspecified pre-existing hypertension complicating pregnancy, unspecified trimester: Secondary | ICD-10-CM | POA: Insufficient documentation

## 2023-03-05 DIAGNOSIS — O0930 Supervision of pregnancy with insufficient antenatal care, unspecified trimester: Secondary | ICD-10-CM | POA: Insufficient documentation

## 2023-03-05 MED ORDER — ASPIRIN 81 MG PO TBEC: 81.0000 mg | DELAYED_RELEASE_TABLET | Freq: Every day | ORAL | 1 refills | Status: DC

## 2023-03-05 NOTE — Telephone Encounter (Signed)
Called Pt to advise of [place] appt on 04/08/23 at 8:15a, no answer , left VM.

## 2023-03-05 NOTE — Progress Notes (Signed)
New OB Note  03/05/2023   Clinic: [facility]  Chief Complaint: new OB  Transfer of Care Patient: no  History of Present Illness: Gina Hurley is a 33 y.o. G4P3003 at 14/4 weeks (EDC 8/8, based on Patient's last menstrual period was 11/22/2022 (within days).=13wk u/s).  Preg complicated by has Supervision of low-risk pregnancy; Bipolar 1 disorder (HCC); Chronic hypertension affecting pregnancy; BMI 39.0-39.9,adult; and Obesity in pregnancy on their problem list.   No morning sickness, OB s/s.   ROS: A 12-point review of systems was performed and negative, except as stated in the above HPI.  OBGYN History: As per HPI. OB History  Gravida Para Term Preterm AB Living  4 3 3   3   SAB IAB Ectopic Multiple Live Births      3    # Outcome Date GA Lbr Len/2nd Weight Sex Type Anes PTL Lv  4 Current           3 Term 03/23/14 [redacted]w[redacted]d  9 lb 13 oz (4.451 kg) M Vag-Spont None  LIV     Birth Comments: wnl  2 Term 09/28/12 [redacted]w[redacted]d 12:18 / 00:15 8 lb 14.2 oz (4.03 kg) M Vag-Spont EPI  LIV  1 Term 06/03/10 [redacted]w[redacted]d 14:00 8 lb 4 oz (3.742 kg) F Vag-Spont EPI N LIV     Birth Comments: gall bladder issues     Past Medical History: Past Medical History:  Diagnosis Date   Bipolar 1 disorder (HCC)    Gallbladder attack    Mental disorder     Past Surgical History: Past Surgical History:  Procedure Laterality Date   ADENOIDECTOMY     as a child   CHOLECYSTECTOMY N/A 02/16/2021   Procedure: LAPAROSCOPIC CHOLECYSTECTOMY;  Surgeon: Lewie Chamber, DO;  Location: [facility];  Service: General;  Laterality: N/A;   ERCP     ERCP with sphincterotomy, sludge in CBD s/p removal, all apparent sludge/stones removed.   Tubes in Ears      Family History:  Family History  Problem Relation Age of Onset   Diabetes Mother    Heart disease Father    Diabetes Father    Hypertension Father    Diabetes Maternal Aunt    Hypertension Maternal Aunt    Hypertension Maternal  Uncle    Hypertension Maternal Grandmother    Asthma Paternal Grandmother     Social History:  Social History   Socioeconomic History   Marital status: Single    Spouse name: Not on file   Number of children: Not on file   Years of education: Not on file   Highest education level: Not on file  Occupational History   Not on file  Tobacco Use   Smoking status: Some Days    Current packs/day: 0.30    Types: Cigarettes   Smokeless tobacco: Never  Vaping Use   Vaping status: Former   Quit date: 09/12/2022  Substance and Sexual Activity   Alcohol use: Not Currently   Drug use: No   Sexual activity: Yes    Birth control/protection: None  Other Topics Concern   Not on file  Social History Narrative   Not on file   Social Drivers of Health   Financial Resource Strain: Not on file  Food Insecurity: Not on file  Transportation Needs: Not on file  Physical Activity: Not on file  Stress: Not on file  Social Connections: Not on file  Intimate Partner Violence: Not on file  Allergy: Allergies  Allergen Reactions   Shellfish Allergy Anaphylaxis and Swelling   Iodine Rash    Current Outpatient Medications: Prenatal  Physical Exam:   BP 121/70   Pulse 88   Wt 231 lb 6.4 oz (105 kg)   LMP 11/22/2022 (Within Days)   BMI 38.51 kg/m  Body mass index is 38.51 kg/m.   Vag. Bleeding: None. Fundal height: not applicable FHTs: 150s  General appearance: Well nourished, well developed female in no acute distress.  Neck:  Supple, normal appearance, and no thyromegaly  Cardiovascular: S1, S2 normal, no murmur, rub or gallop, regular rate and rhythm Respiratory:  Clear to auscultation bilateral. Normal respiratory effort Abdomen: positive bowel sounds and no masses, hernias; diffusely non tender to palpation, non distended Breasts: patient denies any s/s. Neuro/Psych:  Normal mood and affect.  Skin:  Warm and dry.   Pelvic exam: normal external genitalia, vulva,  vagina, cervix, uterus and adnexa, VULVA: normal appearing vulva with no masses, tenderness or lesions, VAGINA: normal appearing vagina with normal color and discharge, no lesions, CERVIX: normal appearing cervix without discharge or lesions, UTERUS: gravid, c/w 14wks, ADNEXA: normal adnexa in size, nontender and no masses, exam chaperoned by CMA Jennette Kettle.  Laboratory: none  Imaging:  reviewed  Assessment: patient doing well  Plan: 1. Supervision of high risk pregnancy in second trimester (Primary) Routine care. Offer afp next visit. Declines genetics. CMA to schedule anatomy u/s - Culture, OB Urine - CBC/D/Plt+RPR+Rh+ABO+RubIgG... - Hemoglobin A1c - Cytology - PAP( [place]) - [place] MFM OB DETAIL +14 WK; Future  2. Chronic hypertension affecting pregnancy Based on prior VS in flowsheets, I told her I recommend diagnosis of CHTN. Patient amenable to starting low dose asa (sent in ) - Protein / creatinine ratio, urine - Comprehensive metabolic panel - TSH Rfx on Abnormal to Free T4 - [place] MFM OB DETAIL +14 WK; Future  3. BMI 39.0-39.9,adult Patient's flowsheet weight is around 230s-240s. Back in January, she was 190lbs which she attributes to work stress and no anything to do with the pregnancy. Total weight gain goal d/w her of maxing out in the 240s-250s  4. Obesity in pregnancy  5. [redacted] weeks gestation of pregnancy - Protein / creatinine ratio, urine - Comprehensive metabolic panel - TSH Rfx on Abnormal to Free T4 - [place] MFM OB DETAIL +14 WK; Future  6. Bipolar 1 disorder (HCC) No issues on no meds  Problem list reviewed and updated.  Follow up in 4 weeks.  The nature of [facility] with multiple MDs and other Advanced Practice Providers was explained to patient; also emphasized that residents, students are part of our team.  >50% of 35 min visit spent on counseling and coordination of care.  Return in about 1 month (around 04/02/2023) for  high risk ob, md or app, in person.  No future appointments.  Cornelia Copa MD Attending Center for [facility])

## 2023-03-06 ENCOUNTER — Encounter: Payer: Self-pay | Admitting: *Deleted

## 2023-03-06 LAB — CBC/D/PLT+RPR+RH+ABO+RUBIGG...
Antibody Screen: NEGATIVE
Basophils Absolute: 0.1 10*3/uL (ref 0.0–0.2)
Basos: 1 %
EOS (ABSOLUTE): 0.3 10*3/uL (ref 0.0–0.4)
Eos: 3 %
HCV Ab: NONREACTIVE
HIV Screen 4th Generation wRfx: NONREACTIVE
Hematocrit: 37.2 % (ref 34.0–46.6)
Hemoglobin: 12.5 g/dL (ref 11.1–15.9)
Hepatitis B Surface Ag: NEGATIVE
Immature Grans (Abs): 0 10*3/uL (ref 0.0–0.1)
Immature Granulocytes: 0 %
Lymphocytes Absolute: 2.4 10*3/uL (ref 0.7–3.1)
Lymphs: 22 %
MCH: 29.8 pg (ref 26.6–33.0)
MCHC: 33.6 g/dL (ref 31.5–35.7)
MCV: 89 fL (ref 79–97)
Monocytes Absolute: 0.5 10*3/uL (ref 0.1–0.9)
Monocytes: 4 %
Neutrophils Absolute: 7.4 10*3/uL — ABNORMAL HIGH (ref 1.4–7.0)
Neutrophils: 70 %
Platelets: 392 10*3/uL (ref 150–450)
RBC: 4.19 x10E6/uL (ref 3.77–5.28)
RDW: 15.5 % — ABNORMAL HIGH (ref 11.7–15.4)
RPR Ser Ql: REACTIVE — AB
Rh Factor: POSITIVE
Rubella Antibodies, IGG: 7.12 {index} (ref >0.99)
WBC: 10.6 10*3/uL (ref 3.4–10.8)

## 2023-03-06 LAB — COMPREHENSIVE METABOLIC PANEL
ALT: 10 [IU]/L (ref 0–32)
AST: 13 [IU]/L (ref 0–40)
Albumin: 4.4 g/dL (ref 3.9–4.9)
Alkaline Phosphatase: 74 [IU]/L (ref 44–121)
BUN/Creatinine Ratio: 15 (ref 9–23)
BUN: 8 mg/dL (ref 6–20)
Bilirubin Total: <0.2 mg/dL (ref 0.0–1.2)
CO2: 20 mmol/L (ref 20–29)
Calcium: 9.8 mg/dL (ref 8.7–10.2)
Chloride: 100 mmol/L (ref 96–106)
Creatinine, Ser: 0.52 mg/dL — ABNORMAL LOW (ref 0.57–1.00)
Globulin, Total: 2.8 g/dL (ref 1.5–4.5)
Glucose: 88 mg/dL (ref 70–99)
Potassium: 4.6 mmol/L (ref 3.5–5.2)
Sodium: 134 mmol/L (ref 134–144)
Total Protein: 7.2 g/dL (ref 6.0–8.5)
eGFR: 127 mL/min/{1.73_m2} (ref >59)

## 2023-03-06 LAB — HEMOGLOBIN A1C
Est. average glucose Bld gHb Est-mCnc: 105 mg/dL
Hgb A1c MFr Bld: 5.3 % (ref 4.8–5.6)

## 2023-03-06 LAB — RPR, QUANT+TP ABS (REFLEX)
Rapid Plasma Reagin, Quant: 1:8 {titer} — ABNORMAL HIGH
T Pallidum Abs: REACTIVE — AB

## 2023-03-06 LAB — PROTEIN / CREATININE RATIO, URINE
Creatinine, Urine: 31.5 mg/dL
Protein, Ur: 14.4 mg/dL
Protein/Creat Ratio: 457 mg/g{creat} — ABNORMAL HIGH (ref 0–200)

## 2023-03-06 LAB — TSH RFX ON ABNORMAL TO FREE T4: TSH: 1.1 u[IU]/mL (ref 0.450–4.500)

## 2023-03-06 LAB — HCV INTERPRETATION

## 2023-03-10 ENCOUNTER — Telehealth: Payer: Self-pay | Admitting: Obstetrics and Gynecology

## 2023-03-10 DIAGNOSIS — A528 Late syphilis, latent: Secondary | ICD-10-CM | POA: Insufficient documentation

## 2023-03-10 LAB — CYTOLOGY - PAP
Chlamydia: POSITIVE — AB
Comment: NEGATIVE
Comment: NEGATIVE
Comment: NEGATIVE
Comment: NEGATIVE
Comment: NORMAL
Diagnosis: HIGH — AB
HPV 16: POSITIVE — AB
HPV 18 / 45: NEGATIVE
High risk HPV: POSITIVE — AB
Neisseria Gonorrhea: NEGATIVE

## 2023-03-10 NOTE — Telephone Encounter (Signed)
OB telephone note Patient called me back and I was able to go over the syphilis diagnosis. Pt states she had a baby in [place] with [facility] in 2016 and was never told anything. So presumably negative; will try and pull records from care everywhere. D/w her re: need for treatment with 3 shots a week apart and importance of partner getting tested, too; I told her he can go to the [facility] for free treatment and testing  Message sent to office again to try and set up appts for her  Cornelia Copa MD Attending Center for [employer] (Faculty Practice) 03/10/2023 Time: [phone]

## 2023-03-10 NOTE — Telephone Encounter (Signed)
OB Telephone Note Patient called at [phone] but no answer; after ringing, generic VM picked up. VM left asking her to call the office ASAP  03/05/2023 RPR+ and TPA reactive with 1:8 titer Last prior one I found was negative in 09/2012, 07/2022, 05/2010 and 01/2010  Recommend diagnosis of late latent and treatment with PCN 2.4 million units qwk x 3 doses

## 2023-03-11 ENCOUNTER — Telehealth: Payer: Self-pay

## 2023-03-11 LAB — CULTURE, OB URINE

## 2023-03-11 LAB — URINE CULTURE, OB REFLEX

## 2023-03-11 NOTE — Telephone Encounter (Signed)
Called pt and left message that I am calling to schedule appointments.  Please call the office.   Leonette Nutting

## 2023-03-12 ENCOUNTER — Telehealth: Payer: Self-pay | Admitting: Obstetrics and Gynecology

## 2023-03-12 DIAGNOSIS — A568 Sexually transmitted chlamydial infection of other sites: Secondary | ICD-10-CM | POA: Insufficient documentation

## 2023-03-12 DIAGNOSIS — D069 Carcinoma in situ of cervix, unspecified: Secondary | ICD-10-CM | POA: Insufficient documentation

## 2023-03-12 DIAGNOSIS — O2342 Unspecified infection of urinary tract in pregnancy, second trimester: Secondary | ICD-10-CM | POA: Insufficient documentation

## 2023-03-12 DIAGNOSIS — R87613 High grade squamous intraepithelial lesion on cytologic smear of cervix (HGSIL): Secondary | ICD-10-CM | POA: Insufficient documentation

## 2023-03-12 MED ORDER — SULFAMETHOXAZOLE-TRIMETHOPRIM 800-160 MG PO TABS: 1.0000 | ORAL_TABLET | Freq: Two times a day (BID) | ORAL | 0 refills | Status: AC

## 2023-03-12 MED ORDER — CEFADROXIL 500 MG PO CAPS: 500.0000 mg | ORAL_CAPSULE | Freq: Two times a day (BID) | ORAL | 0 refills | Status: DC

## 2023-03-12 MED ORDER — AZITHROMYCIN 500 MG PO TABS: 1000.0000 mg | ORAL_TABLET | Freq: Once | ORAL | 0 refills | Status: AC

## 2023-03-12 NOTE — Addendum Note (Signed)
Addended by: Wickliffe Bing on: 03/12/2023 09:04 AM   Modules accepted: Orders

## 2023-03-12 NOTE — Telephone Encounter (Signed)
Pt returned call and I informed her that she has tested + chlamydia, a UTI, the need for treatment for +RPR. and also abnormal pap smear in which she will need a colpo.   Pt scheduled on 03/17/23, 03/24/23, and 03/31/23 at 2:30pm per patient need.  Pt advised that antibiotics have [facility] on [address]  That will treat her UTI and chlamydia.  Pt informed that she will have the colpo on 04/08/23 at her next OB visit. Pt advised to make sure that her partner(s) get treatment as well.  Pt verbalized understanding with no further questions.   Leonette Nutting  03/11/24

## 2023-03-12 NOTE — Telephone Encounter (Signed)
OB Telephone Note Patient called and phone rang and VM left for her to call the office  Patient's labs came back positive for chlamydia, a UTI and an HSIL pap  Azithro and bactrim sent in for CT and UTI and patient needs colpo. Message sent to front desk to make her next OB visit a colpo, too  OB clinic still trying to get in touch with patient for PCN treatment for her syphilis  Cornelia Copa MD Attending Center for [employer] (Faculty Practice) 03/12/2023 Time: 0900

## 2023-03-17 ENCOUNTER — Ambulatory Visit (INDEPENDENT_AMBULATORY_CARE_PROVIDER_SITE_OTHER): Payer: Medicaid Other

## 2023-03-17 VITALS — BP 123/71 | HR 88

## 2023-03-17 DIAGNOSIS — Z3A16 16 weeks gestation of pregnancy: Secondary | ICD-10-CM | POA: Diagnosis not present

## 2023-03-17 DIAGNOSIS — F319 Bipolar disorder, unspecified: Secondary | ICD-10-CM

## 2023-03-17 DIAGNOSIS — O98112 Syphilis complicating pregnancy, second trimester: Secondary | ICD-10-CM

## 2023-03-17 DIAGNOSIS — O98119 Syphilis complicating pregnancy, unspecified trimester: Secondary | ICD-10-CM

## 2023-03-17 DIAGNOSIS — O0990 Supervision of high risk pregnancy, unspecified, unspecified trimester: Secondary | ICD-10-CM

## 2023-03-17 MED ORDER — PENICILLIN G BENZATHINE 1200000 UNIT/2ML IM SUSY
2.4000 10*6.[IU] | PREFILLED_SYRINGE | Freq: Once | INTRAMUSCULAR | Status: AC
  Administered 2023-03-17: 2.4 10*6.[IU] via INTRAMUSCULAR

## 2023-03-17 NOTE — Progress Notes (Unsigned)
 Otilio Jefferson here for Bicillin Injection. Slightly raised red, circular, macular rash seen on forearms and legs. No rash seen on palms of hands or reported by patient on soles of feet. Reviewed with Leanora Cover, MD who recommends previously planned Bicillin injection weekly for 3 weeks. Injection administered without complication. Patient will return in one week for next injection.  Marjo Bicker, RN 03/17/2023  4:31 PM

## 2023-03-24 ENCOUNTER — Ambulatory Visit: Payer: Medicaid Other | Admitting: *Deleted

## 2023-03-24 VITALS — BP 128/63 | HR 89 | Ht 65.0 in | Wt 238.5 lb

## 2023-03-24 DIAGNOSIS — A528 Late syphilis, latent: Secondary | ICD-10-CM

## 2023-03-24 DIAGNOSIS — O0990 Supervision of high risk pregnancy, unspecified, unspecified trimester: Secondary | ICD-10-CM

## 2023-03-24 DIAGNOSIS — F319 Bipolar disorder, unspecified: Secondary | ICD-10-CM

## 2023-03-24 MED ORDER — PENICILLIN G BENZATHINE 1200000 UNIT/2ML IM SUSY
2.4000 10*6.[IU] | PREFILLED_SYRINGE | Freq: Once | INTRAMUSCULAR | Status: AC
  Administered 2023-03-24: 2.4 10*6.[IU] via INTRAMUSCULAR

## 2023-03-24 NOTE — Progress Notes (Signed)
 Here for 2nd injection for treatment Syphilis. FHR 157.Rash noted last week has resolved, no rash noted today.  Injections given without complaint. Advised to keep scheduled appointment for next week for 3rd injections. Also discussed avoid intercourse until both she and partner treated. She voices understanding. Gina Hurley

## 2023-03-31 ENCOUNTER — Other Ambulatory Visit: Payer: Self-pay

## 2023-03-31 ENCOUNTER — Ambulatory Visit: Payer: Medicaid Other

## 2023-03-31 VITALS — BP 120/62 | HR 90 | Wt 240.2 lb

## 2023-03-31 DIAGNOSIS — O0990 Supervision of high risk pregnancy, unspecified, unspecified trimester: Secondary | ICD-10-CM

## 2023-03-31 DIAGNOSIS — O98112 Syphilis complicating pregnancy, second trimester: Secondary | ICD-10-CM

## 2023-03-31 DIAGNOSIS — O98119 Syphilis complicating pregnancy, unspecified trimester: Secondary | ICD-10-CM

## 2023-03-31 DIAGNOSIS — Z3A18 18 weeks gestation of pregnancy: Secondary | ICD-10-CM

## 2023-03-31 MED ORDER — PENICILLIN G BENZATHINE 1200000 UNIT/2ML IM SUSY
2.4000 10*6.[IU] | PREFILLED_SYRINGE | Freq: Once | INTRAMUSCULAR | Status: AC
  Administered 2023-03-31: 2.4 10*6.[IU] via INTRAMUSCULAR

## 2023-03-31 NOTE — Progress Notes (Signed)
 Gina Hurley here for third Bicillin LA injection. Injection administered without complication.   Marjo Bicker, RN 03/31/2023  2:42 PM

## 2023-04-04 ENCOUNTER — Encounter: Payer: Self-pay | Admitting: Family Medicine

## 2023-04-04 ENCOUNTER — Encounter: Payer: Medicaid Other | Admitting: Obstetrics and Gynecology

## 2023-04-04 ENCOUNTER — Other Ambulatory Visit (HOSPITAL_COMMUNITY)
Admission: RE | Admit: 2023-04-04 | Discharge: 2023-04-04 | Disposition: A | Discharge status: Home / Self Care | Source: Ambulatory Visit | Attending: Family Medicine | Admitting: Family Medicine

## 2023-04-04 ENCOUNTER — Ambulatory Visit: Payer: Medicaid Other | Admitting: Family Medicine

## 2023-04-04 ENCOUNTER — Other Ambulatory Visit: Payer: Self-pay

## 2023-04-04 VITALS — BP 139/82 | HR 99 | Wt 241.0 lb

## 2023-04-04 DIAGNOSIS — A568 Sexually transmitted chlamydial infection of other sites: Secondary | ICD-10-CM

## 2023-04-04 DIAGNOSIS — R87613 High grade squamous intraepithelial lesion on cytologic smear of cervix (HGSIL): Secondary | ICD-10-CM | POA: Insufficient documentation

## 2023-04-04 DIAGNOSIS — O10919 Unspecified pre-existing hypertension complicating pregnancy, unspecified trimester: Secondary | ICD-10-CM

## 2023-04-04 DIAGNOSIS — O2342 Unspecified infection of urinary tract in pregnancy, second trimester: Secondary | ICD-10-CM

## 2023-04-04 DIAGNOSIS — O0992 Supervision of high risk pregnancy, unspecified, second trimester: Secondary | ICD-10-CM

## 2023-04-04 DIAGNOSIS — O10912 Unspecified pre-existing hypertension complicating pregnancy, second trimester: Secondary | ICD-10-CM

## 2023-04-04 DIAGNOSIS — O99210 Obesity complicating pregnancy, unspecified trimester: Secondary | ICD-10-CM

## 2023-04-04 DIAGNOSIS — Z3A19 19 weeks gestation of pregnancy: Secondary | ICD-10-CM | POA: Diagnosis not present

## 2023-04-04 DIAGNOSIS — O99212 Obesity complicating pregnancy, second trimester: Secondary | ICD-10-CM

## 2023-04-04 DIAGNOSIS — O0990 Supervision of high risk pregnancy, unspecified, unspecified trimester: Secondary | ICD-10-CM

## 2023-04-04 DIAGNOSIS — F319 Bipolar disorder, unspecified: Secondary | ICD-10-CM

## 2023-04-04 DIAGNOSIS — A528 Late syphilis, latent: Secondary | ICD-10-CM

## 2023-04-04 DIAGNOSIS — O98312 Other infections with a predominantly sexual mode of transmission complicating pregnancy, second trimester: Secondary | ICD-10-CM

## 2023-04-04 NOTE — Progress Notes (Signed)
    GYNECOLOGY CLINIC COLPOSCOPY PROCEDURE NOTE  33 y.o. [id number] here for colposcopy for pap finding of:  Result Date Procedure Results Follow-ups  03/05/2023 Cytology - PAP( [place]) High risk HPV: Positive (A) Comment: Normal Reference Range HPV - Negative Comment: Normal Reference Range HPV 16- Negative Comment: Normal Reference Range HPV 16 18 45 -Negative Neisseria Gonorrhea: Negative Chlamydia: Positive (A) HPV 16: Positive (A) HPV 18 / 45: Negative Adequacy: Satisfactory for evaluation; transformation zone component PRESENT. Diagnosis: - High grade squamous intraepithelial lesion (HSIL) (A) Comment: Normal Reference Ranger Chlamydia - Negative Comment: Normal Reference Range Neisseria Gonorrhea - Negative   07/29/2012 Cytology - PAP      Discussed role for HPV in cervical dysplasia, need for surveillance, nature of the procedure, and risks and benefits.  Pregnancy test: Lab Results  Component Value Date   PREGTESTUR NEGATIVE 12/20/2022    Allergies  Allergen Reactions   Shellfish Allergy Anaphylaxis and Swelling   Iodine Rash    Patient given informed consent, signed copy in the chart, time out was performed.    Placed in lithotomy position. Cervix viewed with speculum and colposcope after application of acetic acid.   Colposcopy Adequacy Cervix fully visualized: No , incredibly challenging colpo with constantly prolapsing vaginal side walls. Unable to adequately visualize the cervix baseline much less after application of acetic acid.  SCJ fully visualized: No    Colposcopy Findings See above, unable to get adequate visualization so random biopsies were obtained from all four quadrants.  ECC specimen was not obtained.  All specimens were labeled and sent to pathology.  Hemostatic measures: Monsel's solution  Complications: none  Patient tolerated the procedure well.  OBGyn Exam  Colposcopy Impressions   Plan Treatment plan pending biopsy  results, per patient preference they will be communicated by telephone.  Patient was given post procedure instructions.  Will follow up pathology and manage accordingly; patient will be contacted with results and recommendations.  Routine preventative health maintenance measures emphasized.  Venora Maples, MD/MPH Attending Family Medicine Physician, [facility] for [facility], [facility]

## 2023-04-04 NOTE — Progress Notes (Signed)
   Subjective:  Gina Hurley is a 33 y.o. [phone] at [redacted]w[redacted]d being seen today for ongoing prenatal care.  She is currently monitored for the following issues for this high-risk pregnancy and has Supervision of high risk pregnancy, antepartum; Bipolar 1 disorder (HCC); Chronic hypertension affecting pregnancy; BMI 39.0-39.9,adult; Obesity in pregnancy; Late prenatal care affecting pregnancy, antepartum; Late latent syphilis; Chlamydia trachomatis infection in pregnancy in second trimester; UTI in pregnancy, antepartum, second trimester; and HGSIL (high grade squamous intraepithelial lesion) on Pap smear of cervix on their problem list.  Patient reports no complaints.  Contractions: Not present. Vag. Bleeding: None.  Movement: Present. Denies leaking of fluid.   The following portions of the patient's history were reviewed and updated as appropriate: allergies, current medications, past family history, past medical history, past social history, past surgical history and problem list. Problem list updated.  Objective:   Vitals:   04/04/23 1145  BP: 139/82  Pulse: 99  Weight: 241 lb (109.3 kg)    Fetal Status: Fetal Heart Rate (bpm): 161   Movement: Present     General:  Alert, oriented and cooperative. Patient is in no acute distress.  Skin: Skin is warm and dry. No rash noted.   Cardiovascular: Normal heart rate noted  Respiratory: Normal respiratory effort, no problems with respiration noted  Abdomen: Soft, gravid, appropriate for gestational age. Pain/Pressure: Present     Pelvic: Vag. Bleeding: None     Cervical exam deferred        Extremities: Normal range of motion.  Edema: Trace  Mental Status: Normal mood and affect. Normal behavior. Normal judgment and thought content.   Urinalysis:      Assessment and Plan:  Pregnancy: G4P3003 at [redacted]w[redacted]d  1. [redacted] weeks gestation of pregnancy (Primary)   2. Supervision of high risk pregnancy, antepartum BP and FHR normal Due for AFP but  deferred given late start and long visit Consider AFP next visit  3. HGSIL (high grade squamous intraepithelial lesion) on Pap smear of cervix HSIL, +HRHPV, +HPV 16 on new OB Long conversation regarding pap smear as screening test and role of colposcopy as diagnostic test Colposcopy completed today, see procedure note  4. Late latent syphilis S/p penicillin x3 Partner has been treated as well F/u RPR on third trimester labs  5. Chronic hypertension affecting pregnancy Normotensive today, no meds Taking ASA  6. Chlamydia trachomatis infection in pregnancy in second trimester S/p Azithromycin Partner has been treated as well TOC next visit  7. Bipolar 1 disorder (HCC) Reports she is doing well, no meds  8. UTI in pregnancy, antepartum, second trimester Inadvertently not addressed at this visit, per chart review has been rx'd antibiotics TOC next visit  9. Obesity in pregnancy   Preterm labor symptoms and general obstetric precautions including but not limited to vaginal bleeding, contractions, leaking of fluid and fetal movement were reviewed in detail with the patient. Please refer to After Visit Summary for other counseling recommendations.  No follow-ups on file.   Venora Maples, MD

## 2023-04-08 ENCOUNTER — Ambulatory Visit: Payer: Medicaid Other | Attending: Obstetrics and Gynecology

## 2023-04-08 ENCOUNTER — Other Ambulatory Visit: Payer: Self-pay

## 2023-04-08 ENCOUNTER — Encounter: Payer: Self-pay | Admitting: Family Medicine

## 2023-04-08 ENCOUNTER — Ambulatory Visit: Payer: Medicaid Other

## 2023-04-08 ENCOUNTER — Ambulatory Visit (HOSPITAL_BASED_OUTPATIENT_CLINIC_OR_DEPARTMENT_OTHER): Payer: Medicaid Other | Admitting: Maternal & Fetal Medicine

## 2023-04-08 ENCOUNTER — Telehealth: Payer: Self-pay | Admitting: Family Medicine

## 2023-04-08 VITALS — BP 129/75 | HR 80

## 2023-04-08 DIAGNOSIS — O10919 Unspecified pre-existing hypertension complicating pregnancy, unspecified trimester: Secondary | ICD-10-CM

## 2023-04-08 DIAGNOSIS — Z3A19 19 weeks gestation of pregnancy: Secondary | ICD-10-CM

## 2023-04-08 DIAGNOSIS — O99210 Obesity complicating pregnancy, unspecified trimester: Secondary | ICD-10-CM

## 2023-04-08 DIAGNOSIS — O0992 Supervision of high risk pregnancy, unspecified, second trimester: Secondary | ICD-10-CM | POA: Diagnosis present

## 2023-04-08 DIAGNOSIS — O4442 Low lying placenta NOS or without hemorrhage, second trimester: Secondary | ICD-10-CM

## 2023-04-08 DIAGNOSIS — O0990 Supervision of high risk pregnancy, unspecified, unspecified trimester: Secondary | ICD-10-CM | POA: Diagnosis present

## 2023-04-08 DIAGNOSIS — Z3A14 14 weeks gestation of pregnancy: Secondary | ICD-10-CM | POA: Insufficient documentation

## 2023-04-08 DIAGNOSIS — O4440 Low lying placenta NOS or without hemorrhage, unspecified trimester: Secondary | ICD-10-CM | POA: Diagnosis not present

## 2023-04-08 DIAGNOSIS — F319 Bipolar disorder, unspecified: Secondary | ICD-10-CM | POA: Insufficient documentation

## 2023-04-08 DIAGNOSIS — A528 Late syphilis, latent: Secondary | ICD-10-CM | POA: Diagnosis not present

## 2023-04-08 LAB — SURGICAL PATHOLOGY

## 2023-04-08 NOTE — Progress Notes (Signed)
 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

## 2023-04-08 NOTE — Telephone Encounter (Signed)
 Patient called to receive results of colpo. Discussed CIN 2/3 requires LEEP postpartum but no action right now. All questions answered.

## 2023-04-08 NOTE — Progress Notes (Signed)
 Patient information  Patient Name: Gina Hurley  Patient MRN:   [id number]  Referring practice: MFM Referring Provider: [facility])  MFM CONSULT  Gina Hurley is a 33 y.o. [phone] at [redacted]w[redacted]d here for ultrasound and consultation. Patient Active Problem List   Diagnosis Date Noted   Chlamydia trachomatis infection in pregnancy in second trimester 03/12/2023   UTI in pregnancy, antepartum, second trimester 03/12/2023   HGSIL (high grade squamous intraepithelial lesion) on Pap smear of cervix 03/12/2023   Late latent syphilis 03/10/2023   Chronic hypertension affecting pregnancy 03/05/2023   BMI 39.0-39.9,adult 03/05/2023   Obesity in pregnancy 03/05/2023   Late prenatal care affecting pregnancy, antepartum 03/05/2023   Supervision of high risk pregnancy, antepartum 02/25/2023   Bipolar 1 disorder (HCC)     Gina Hurley has a pregnancy with the complications mentioned in the problem list. During today's visit we focused on the following concerns:   RE CHTN: no meds except 81 mg ASA.   RE syphilis: s/p treatment. Needs repeat RPR every trimester or with exposure. Increase in titer suggest re-infection and need for re-treatment.   RE low-lying placenta: I discussed the increased risk of bleeding associated with this condition.  This is most likely that this will resolve prior to delivery.  Vaginal delivery is preferred if there are no other contraindications.   RE elevated BMI: I discussed the potential complications associated with obesity in pregnancy.  I discussed the need for continued growth ultrasounds and possibly antenatal testing depending upon how the pregnancy course progresses.  Maternal weight gain should be limited to 10 to 20 pounds during the pregnancy.  While normal weight loss may occur during the first and early second trimester, efforts to actively lose weight with the use of medication is not recommended during pregnancy.  A whole food  diet and regular exercise of at least 15 to 30 minutes of moderately strenuous activity is recommended in the absence of any contraindications. Weight loss with the use of medications is not recommended during pregnancy.  If other existing comorbidities are present then 81 mg of aspirin should be considered for preeclampsia risk reduction.   Sonographic findings Single intrauterine pregnancy at 19w 4d  Fetal cardiac activity:  Observed and appears normal. Presentation: Breech. The anatomic structures that were well seen appear normal without evidence of soft markers. Due to poor acoustic windows some structures remain suboptimally visualized. Fetal biometry shows the estimated fetal weight at the 56 percentile.  Amniotic fluid: Within normal limits.  MVP: 4.63 cm. Placenta: Anterior, low-lying, 0.9 cm from int os. Adnexa: No adnexal mass visualized. Cervical length: 4.1 cm.  There are limitations of prenatal ultrasound such as the inability to detect certain abnormalities due to poor visualization. Various factors such as fetal position, gestational age and maternal body habitus may increase the difficulty in visualizing the fetal anatomy.    Recommendations -EDD should be 08/29/2023 based on  LMP  (11/22/22). -Follow up ultrasound in 4-6 weeks to attempt visualization of the anatomy not seen and reassess the fetal growth -Baseline preeclampsia labs: CMP, CBC and urine protein/creatinine ratio if not previously completed.  -RPR should be repeated every trimester or sooner if there are concerns for repeat exposure.  -Early glucose screening due to multiple risk factors. -Continue Aspirin 81 mg for preeclampsia prophylaxis -Follow-up anatomy and fetal growth in 4 to 6 weeks -Serial growth ultrasounds starting around 28 weeks to monitor for fetal growth restriction -  Antenatal testing to start around 32 weeks due to the increased risk of stillbirth and high risk pregnancy -Avoid digital vaginal  exam if there is bleeding due to a low-lying placenta.  -Continue routine prenatal care with referring OB provider  Review of Systems: A review of systems was performed and was negative except per HPI   Vitals and Physical Exam    04/08/2023    8:05 AM 04/04/2023   11:45 AM 03/31/2023    2:55 PM  Vitals with BMI  Weight  241 lbs 240 lbs 3 oz  BMI  40.1 39.97  Systolic 129 139 161  Diastolic 75 82 62  Pulse 80 99 90    Sitting comfortably on the sonogram table Nonlabored breathing Normal rate and rhythm Abdomen is nontender  Past pregnancies OB History  Gravida Para Term Preterm AB Living  4 3 3   3   SAB IAB Ectopic Multiple Live Births      3    # Outcome Date GA Lbr Len/2nd Weight Sex Type Anes PTL Lv  4 Current           3 Term 03/23/14 [redacted]w[redacted]d  9 lb 13 oz (4.451 kg) M Vag-Spont None  LIV     Birth Comments: wnl  2 Term 09/28/12 [redacted]w[redacted]d 12:18 / 00:15 8 lb 14.2 oz (4.03 kg) M Vag-Spont EPI  LIV  1 Term 06/03/10 [redacted]w[redacted]d 14:00 8 lb 4 oz (3.742 kg) F Vag-Spont EPI N LIV     Birth Comments: gall bladder issues     I spent 45 minutes reviewing the patients chart, including labs and images as well as counseling the patient about her medical conditions. Greater than 50% of the time was spent in direct face-to-face patient counseling.  Braxton Feathers, DO Maternal fetal medicine, [place]   04/08/2023  9:32 AM

## 2023-04-09 ENCOUNTER — Telehealth: Payer: Self-pay | Admitting: *Deleted

## 2023-04-09 NOTE — Telephone Encounter (Signed)
 I called Angenette and left a message on voicemail that I am trying to reach her with results and to schedule an appointment. Requested she call office back.  Nancy Fetter

## 2023-04-09 NOTE — Telephone Encounter (Signed)
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

## 2023-04-11 NOTE — Telephone Encounter (Signed)
 Called pt; VM left stating I am calling with results. Will review at next OB visit since this was third attempt.

## 2023-05-02 ENCOUNTER — Ambulatory Visit: Admitting: Medical

## 2023-05-02 ENCOUNTER — Encounter: Payer: Self-pay | Admitting: Family Medicine

## 2023-05-02 ENCOUNTER — Encounter: Payer: Self-pay | Admitting: Medical

## 2023-05-02 ENCOUNTER — Other Ambulatory Visit (HOSPITAL_COMMUNITY)
Admission: RE | Admit: 2023-05-02 | Discharge: 2023-05-02 | Disposition: A | Discharge status: Home / Self Care | Source: Ambulatory Visit | Attending: Medical | Admitting: Medical

## 2023-05-02 VITALS — BP 124/81 | HR 89 | Wt 242.7 lb

## 2023-05-02 DIAGNOSIS — O98312 Other infections with a predominantly sexual mode of transmission complicating pregnancy, second trimester: Secondary | ICD-10-CM

## 2023-05-02 DIAGNOSIS — O10919 Unspecified pre-existing hypertension complicating pregnancy, unspecified trimester: Secondary | ICD-10-CM

## 2023-05-02 DIAGNOSIS — O10912 Unspecified pre-existing hypertension complicating pregnancy, second trimester: Secondary | ICD-10-CM

## 2023-05-02 DIAGNOSIS — O2342 Unspecified infection of urinary tract in pregnancy, second trimester: Secondary | ICD-10-CM

## 2023-05-02 DIAGNOSIS — O0930 Supervision of pregnancy with insufficient antenatal care, unspecified trimester: Secondary | ICD-10-CM

## 2023-05-02 DIAGNOSIS — D069 Carcinoma in situ of cervix, unspecified: Secondary | ICD-10-CM

## 2023-05-02 DIAGNOSIS — F319 Bipolar disorder, unspecified: Secondary | ICD-10-CM | POA: Diagnosis not present

## 2023-05-02 DIAGNOSIS — A568 Sexually transmitted chlamydial infection of other sites: Secondary | ICD-10-CM | POA: Diagnosis present

## 2023-05-02 DIAGNOSIS — Z3A23 23 weeks gestation of pregnancy: Secondary | ICD-10-CM

## 2023-05-02 DIAGNOSIS — O0932 Supervision of pregnancy with insufficient antenatal care, second trimester: Secondary | ICD-10-CM

## 2023-05-02 DIAGNOSIS — O99212 Obesity complicating pregnancy, second trimester: Secondary | ICD-10-CM

## 2023-05-02 DIAGNOSIS — A528 Late syphilis, latent: Secondary | ICD-10-CM

## 2023-05-02 DIAGNOSIS — O0992 Supervision of high risk pregnancy, unspecified, second trimester: Secondary | ICD-10-CM

## 2023-05-02 DIAGNOSIS — O4440 Low lying placenta NOS or without hemorrhage, unspecified trimester: Secondary | ICD-10-CM

## 2023-05-02 DIAGNOSIS — O4442 Low lying placenta NOS or without hemorrhage, second trimester: Secondary | ICD-10-CM

## 2023-05-02 DIAGNOSIS — O99210 Obesity complicating pregnancy, unspecified trimester: Secondary | ICD-10-CM

## 2023-05-02 DIAGNOSIS — O0990 Supervision of high risk pregnancy, unspecified, unspecified trimester: Secondary | ICD-10-CM

## 2023-05-02 NOTE — Patient Instructions (Addendum)
[facility] Pediatric Providers  [place]outheast [place] ([address]) [facility] Manson Passey, MD; Deirdre Priest, MD; Lum Babe, MD; Leveda Anna, MD; McDiarmid, MD; Jerene Bears, MD [address] [place] [place], [place] [address] [phone] Mon-Fri 8:30-12:30, 1:30-5:00  Providers come to see babies during newborn hospitalization Only accepting infants of Mother's who are seen at [facility] or have siblings seen at   [facility] Medicaid - Yes; Tricare - Yes   [facility] Luyando, MD [address], [place], [place] [address] [phone] Mon, Tue, Thur, Fri 8:30-5:00, Wed 10:00-7:00 (closed 1-2pm daily for lunch) [facility] residents with no insurance.  [place] [employer] only with Medicaid/insurance; Tricare - no  [facility] for Children [facility]) - Tim [facility], MD; Manson Passey, MD; Ave Filter, MD; Luna Fuse, MD; Kennedy Bucker, MD; Florestine Avers, MD; Melchor Amour, MD; Yetta Barre,  MD; Konrad Dolores, MD; Kathlene November, MD; Jenne Campus, MD; Wynetta Emery, MD; Duffy Rhody, MD; Gerre Couch, NP [address] [place], [place], [place] [address] [phone] Mon, Tue, Thur, Fri 8:30-5:30, Wed 9:30-5:30, Sat 8:30-12:30 Only accepting infants of first-time parents or siblings of current patients Hospital discharge coordinator will make follow-up appointment Medicaid - yes; Tricare - yes  East/[place]ortheast [place] [phone]) [facility] I[facility]an China, MD; Earlene Plater, MD; Jamesetta Orleans, MD; Alvera Novel, MD; Rana Snare, MD; [facility], MD; Shaaron Adler, MD; Hosie Poisson, MD; Mayford Knife, MD [address] [place], [place] [address] [phone] Mon-Fri 8:30-5:00, closed for lunch 12:30-1:30; Sat-Sun 10:00-1:00 Accepting Newborns with commercial insurance only, must call prior to delivery to be accepted into  practice.  Medicaid - no, Tricare - yes   [facility] [address] [place], [place] [address] [phone] or [phone] Mon to Fri 8am to 10pm, Sat 8am to 1pm  (virtual only on weekends) Only accepts Medicaid Healthy Blue pts  [facility] ([facility]) - Pediatrics at Elige Radon, MD; Sabino Dick, MD; Quitman Livings, MD; Betha Loa, NP; Claretha Cooper, MD; Lelon Perla, MD [address] [place], [place] [address] [phone] Mon-Fri 8:30-5:30 Medicaid - yes, Tricare - yes  [place] [phone]) [facility] Marcie Mowers, MD [address], [place], [place] [address] [phone] Iona Hansen, Wed Fri 8:30-5:00, Sat 8:30-12:00, Closed Thursdays Accepting siblings of established patients and first time mom's if you call prenatally Medicaid- yes; Tricare - yes  [facility] Lutricia Feil, [place]; Tracie Harrier, MD; Rusty Aus; Scifres, PA; Wynelle Link, MD; Azucena Cecil, MD;  [address] [place], [place] [address] [phone] Mon-Fri 8:30-5:00, closed for lunch 1-2 Only accepting newborns of established patients Medicaid- no; Tricare - yes  [facility] [phone]) [facility] Morene Crocker, MD; [address], [place], [place] [address] [phone] Mon-Fri 8:00-5:00 Medicaid - No; Tricare - Yes  [facility], [place]; [place], [place] [address], [place], [place] [address] [phone] Mon-Fri 8:00-5:00 Medicaid - No, Tricare - Yes  [facility] Cardell Peach, MD; Nash Dimmer, MD; [place], [place] [address], [address] [place], [place] [address] [phone]  Mon-Fri 8:00-5:00 Medicaid - No; Tricare - Yes  [facility] [address], [place], [place] [address] [phone] Mon-Fri 8:30-5:00 (lunch 12:00-1:00) Medicaid -Yes; Tricare - Yes  [facility] [place], MD [address] [place], [place], [place] [address] [phone] Mon-Fri 8:00-5:00 Seeing newborns of current patients only. No new patients Medicaid - No, Tricare - yes  Nature conservation officer at [place] [address], MD [address] [place], [place] [address] [phone] Mon-Fri 8:00-5:00 Medicaid -yes as secondary coverage only;  Tricare - yes  [facility] [place], [place]; [place], [place]; Avis Epley, MD; Vonna Kotyk, MD; Clance Boll, MD; [place], [place]; [place], NP; Vaughan Basta, MD; [place], MD [address] [place], [place] [address] [phone] Mon-Fri 8:30-5:00, Sat 9:00-11:00 Accepts commercial insurance ONLY. Offers free prenatal information sessions for families. Medicaid - No, Tricare - Call first  [facility] [place], MD; [place], [place]; [place], [place]; [place], [place] [address] [place] [address] [phone] Mon-Fri 7:30-5:30 Medicaid - Yes; Ailene Rud yes  [place] [phone] & [phone])  [facility], MD [address] [place], [place] [address] [phone] Mon-Thur 8:00-6:00, closed for lunch 12-2, closed Fridays Medicaid - yes; Tricare - no  [facility] Dareen Piano, NP; Cyndia Bent, MD; [place], [place]; [place], [place] [address], [place], [place] [address] [phone] Mon-Fri 7:30-4:30 Medicaid - yes, Tricare - yes  [facility]  Juanito Doom, MD; Janene Harvey, NP; Vonita Moss, MD; Donn Pierini, NP [address], [place], [place] [address] [phone] Mon-Fri 8:30-5:00, closed for lunch 1-2, Sat 8:30-12:00 - sick visits only Providers come to see babies at [facility] Only accepting newborns of siblings and first time parents ONLY if who have met with office prior to delivery Medicaid -Yes; Tricare - yes  [facility]s - [place], [place]; Spero Geralds, NP; Earlene Plater, MD; Lucretia Roers, MD:  [address], [place], [place] [address] [phone] Mon- Fri 8:00-5:00, Sat 9:00-12:00 - sick visits only Accepting siblings of established patients and first time mom/baby Medicaid - Yes; Tricare - yes Patients must have vaccinations (baby vaccines)  [place] [phone] &  [phone])  Adult nurse HealthCare at [facility] [address] [place], [place] [address] [phone] Mon-Fri 8:00-5:00 Medicaid - no; Tricare - yes  [facility] South[place], MD; [place], [place]; [place], [place] [address], [place], [place] [address] [phone] Mon-Fri 8:00-5:00 Medicaid- yes; Tricare - yes  [facility] Family Medicine - Ardeen Jourdain, MD; Yetta Barre, NP; [place], [place] [address] [place], [place], [place] [address] [phone] Mon-Fri 8:00-5:00 Medicaid - Yes; Tricare - yes  [address] [place] [phone])  [facility] [place], [place]; [place], [place]; Eddie Candle, MD; Normand Sloop, MD; [place], NP; Isenhour, DO; [place], [place]; Constance Goltz, MD; Ruthann Cancer, MD; Vear Clock, MD; [place], [place]; [place], [place]; [place], [place] [address] [facility] [address], [place], [place] [address] [phone] Mon-Fri 8:30-5:00, Sat 9:00-12:00 - sick only Please register online [email or web address] then schedule online or call office Medicaid-Yes; Tricare -yes  [facility] - Premier  Dab[place], MD; Romualdo Bolk, MD; [place], MD; [place], NP; [place], [place]; Antonietta Barcelona, MD; Mayford Knife, NP; Shelva Majestic, MD [address], [place], [place] [address] [phone] Mon-Fri 8:00-5:30, Sat&Sun by appointment (phones open at 8:30) Medicaid - Yes; Tricare - yes  [place] [phone] & [phone]) [facility] Mariel Aloe; [place], MD; Roger Shelter, MD; Arvilla Market, NP; [place], DO [address], [place], [place] [address] [phone] M-F 8:00 - 5:15, Sat/Sun 9-12 sick visits only Medicaid - No; Tricare - yes  [facility]  [place], PA-C; [place], PA-C; [place], DO; [place], PA-C; [place], PA-C; Roselyn Bering, MD [address], [place], [place] [address] [phone] Mon-Thur 8:00-7:00, Fri 8:00-5:00 Accepting Medicaid for 13 and under only   [facility] - Family Medicine  at [place] [facility] [place]) [place], [place]; List, FNP; Berneda Rose, MD; [place], PA-C; Scholer,  MD; Kellie Simmering, FNP; Genevie Cheshire, FNP; Evaristo Bury, MD; Berneda Rose, MD [phone] [address] [place], [place] [address] [phone] Mon-Fri 8:30-5:30 Medicaid - Yes; Tricare - yes  [facility]s - [address]  [place], [place]; Whitney Post, MD; Hennie Duos, MD; Wynne Dust, MD; [place], NP [address], [place], [place] [address] [phone] Mon-Thur 8:00-5:30, Fri 8:00-5:00, Sat 9:00-12:00 Medicaid - yes, Tricare - yes  [place] [phone])  [facility], [place]; Lenise Arena, MD; [place], [place] [address], [place], [place] [address] [phone] Mon-Fri 8:00-5:00, closed for lunch 12-1 Medicaid - No; Tricare - yes  Nature conservation officer at [facility], MD [address] [place] [address] [place], [place] [address] [phone] Mon-Fri 8:00-5:00 Medicaid - No; Tricare - yes  [facility] - [facility], MD; Tami Ribas, MD; Mariam Dollar, MD; Yetta Barre, MD [address] [facility] [address], [place], [place] [address] [phone] Mon-Fri 8:00-5:00 Medicaid- Yes; Tricare - yes  [place] [phone])  Adult nurse HealthCare at [facility], [place]; [place], MD [address] [place], [place], [place] [address] [phone] Mon-Fri 8:00-5:00 Medicaid - No; Tricare - yes  [facility] Medicine - Whitney Post - CPNP [address], [place], [place] [address] [phone] Mon-Weds 8:00-6:00, Thurs-Fri 8:00-5:00, Sat 9:00-12:00 Medicaid - yes; Tricare - yes   [facility] Katharina Caper, MD; [place], [place] [address] [place], [place] [address] [phone] Mon-Fri 8:00-5:00 Medicaid - yes; Tricare - yes  [facility] Pediatric Providers  [facility] [address], [place], [place] [address] [phone] Sheral Flow: 8am -8pm, Tues, Weds: 8am - 5pm; Fri: 8-1 Medicaid - Yes; Tricare -  yes  [facility] Rachel Bo, MD; Laural Benes, MD; Anner Crete, MD; [place], [place]; [place], [place] [address] [place], [place] [address] [phone] M-F 8:30 - 5:00 Medicaid - Call office; Tricare -yes  [facility] Edson Snowball, MD; Shanon Rosser, MD, Chelsea Primus, MD; Shirlyn Goltz, PNP; Wardell Heath, NP [phone] [address] [place], [place] [address] [phone] M-F 8:30 - 5:00, Sat/Sun 8:30 - 12:30 (sick visits) Medicaid - Call office; Tricare -yes  [facility] Melvyn Neth, MD; Karl Luke, PNP; Princess Bruins, MD; [place], [place]; Ladd, NP; Cynda Familia [address], [place], [place] [address] [phone] M-F 8:30 - 5:00 Medicaid - Call office; Tricare - yes  [facility] Jesusita Oka, MD; Dierdre Highman, MD; Earnest Conroy, MD; Timothy Lasso, MD; Nogo, MD [phone] [address], [place], [place] [address] [phone] M-Thur: 8:00 - 5:00; Fri: 8:00 - 4:00 Medicaid - yes; Tricare - yes  [facility] [address] [place], [place] [address] [phone] M-F: 8:30- 5:00, closed for lunch 12:30 - 1:00 Medicaid - yes; Tricare -yes  [facility] [address] [place], [place] [address] [phone] M-F 8:00 - 5:00 Medicaid - yes; Tricare - yes  [facility] Winton, DO; [place], DO; [place], NP [address] [place], [place] [address] [phone] M-F 8:00 - 5:00, Closed 12-1 for lunch Medicaid - Call; Tricare - yes  [facility] Meredith Mody, MD [address] [place], [place] [address] [phone] M-F: 8:00-5:00, Sat: 8:00 - noon Medicaid - call; Tricare -yes  [facility] Pediatric Providers  [facility] [place], [place] [address] [place], [place], [place] [address] [phone] M-W: 8:00-5:00, Thur: 8:00 - 7:00, Fri: 8:00 - noon Medicaid - yes; Tricare - yes  [facility] - Quay Burow, FNP [address], [place],  [place] [address] [phone] M-F 8:00 - 5:00, Closed for lunch 12-1 Medicaid -  yes; Tricare - yes  [facility] Pediatric Providers  [facility] [place], [place], Alinda Money, MD, [place], FNP-C [address], [facility], [address], [place], [place] [address] [phone] M-T 8:00-5:00, Wed-Fri 7:00-6:00 Medicaid - Yes; Tricare -yes  [facility] at [facility], [place]; [address] [place], [place] [address] [phone] M-F 8:00 - 5:00, closed for lunch 12-1 Medicaid - Yes; Tricare - yes  [facility] and Internal Medicine  Zachery Dauer, MD; Gladstone Lighter, MD; Collie Siad, MD; Freda Jackson, MD; Rich Number, MD; Darryl Nestle, MD; Melinda Crutch, MD, Audria Nine, MD; Tawanna Cooler, MD; Steffanie Dunn, MD; Byrd Hesselbach, MD; Lucretia Roers, MD [address] [place], [place] [address] [phone] M-F 8:00-5:00 Medicaid - yes; Tricare - yes  [facility] [place], MD (speaks [place] and Hindi) [address] [place], [place] [address] [phone] M-F: 8:30 - 5:00, closed 12:30 - 1 for lunch Medicaid - Yes; Tricare -yes  [facility] Pediatric Providers  [facility] Shanda Bumps, MD; Chanetta Marshall, MD; Laurell Josephs, MD [address] [place], [place] [address] [phone] M-Th: 8:00 - 5:30, Fri: 8:00 - 12:00 Medicaid - yes; Tricare - yes  [facility] at [facility], NP; Thora Lance, MD; Orrin Brigham, MD [phone] [address], [place], [place] [address] [phone] M-F: 8:00 - 5:00 Medicaid - yes; Tricare - yes  [facility] [place], NP; Orson Slick, NP; Salley Scarlet, NP; Linton Flemings, MD; Mayford Knife, MD, [place], NP, Emelda Fear, MD; Nida Boatman [address], [place], [place] [address] [phone] M-F: 8:30 - 5:30p Medicaid - yes; Tricare - yes Other locations available as well  [facility], MD; Andrey Campanile, MD; Neville Route, PA-C [address], [place], [place] [address] [phone] M-W: 8:00am - 7:00pm, Thurs: 8:00am - 8:00pm; Fri: 8:00am -  5:00pm, closed daily from 12-1 for lunch Medicaid - yes; Tricare - yes  [facility] [place] Pediatric Providers  [facility]s at Levin Erp, MD; Aggie Cosier, FNP; Bland Span, MD; Tristan Schroeder, MD; [place], PNP; Alesia Banda; [place], [place]; Julian Reil, MD;  [address] [place], [place] [address] [phone] Judie Petit - Caleen Essex: 8am - 5pm, Sat 9-noon Medicaid - Yes; Tricare -yes  [facility] at Jaclynn Guarneri, MD; Yetta Barre, FNP; Lilian Kapur, MD; Mariam Dollar, MD [address] [place], [place] [phone] M-F 8:00 - 5:00 Medicaid - call; Tricare - yes  [facility]- Cruz Condon, MD; [place], [place]; Delora Fuel, MD; Dareen Piano, MD; Trudee Grip, MD; Kizzie Ide, MD; Zebedee Iba; Birdena Crandall, MD; Hinton Dyer, MD; [place], MD [address] [place], [place] [address] [phone] M-F 8:00am - 5:00pm; Sat. 9:00 - 11:00 Medicaid - yes; Tricare - yes  [facility] at [facility], MD [address], [place], [place] [address] [phone] M-F 8:00 - 5:00 Medicaid - [place] Medicaid only; Tricare - yes  [facility] - Illene Bolus, MD; Earlene Plater, [place]; Kenyon Ana, MD [address] [place], [place] [address] [phone] M-F 8:00 - 5:00 Medicaid - yes; Tricare - yes  [facility] [address] Pediatrics - Lind Covert, MD; Manson Passey, MD, [facility], MD, [place], MD; [place], MD; Katrinka Blazing, MD; [address] [place], [place] [address] [phone] M-F: 8-5 Medicaid - yes; Tricare - yes  [facility] [place] [facility] - Henrietta Hoover, [place]; [place], MD; [address], [place], [place] [address] [phone] M-F 8-5 Medicaid - yes; Tricare - yes  [address] [place] Darrol Poke, MD; Tami Ribas, MD;  [place], MD; [place], DNP; [place], PNP [address], [place], [place] [address] [phone] M-F 8-5 Medicaid - yes; Tricare - yes  [facility] Pediatrics Delories Heinz, MD;  Adrienne Mocha; Ala Bent, MD [address] [place], [place] [address] [phone] M-F 7am - 5 pm Medicaid - call; Tricare - yes  [facility] [place], [place]; Fredia Beets, MD; Roxan Hockey, MD [address] [place], [place] [address] [phone] M-F 8-5 Medicaid - yes; Tricare - yes  [facility] Kae Heller, MD; Sheliah Hatch, MD; Mayford Knife, FNP; Shon Baton, FNP; Tyron Russell, FNP; Ishmael Holter; [facility] - FNP [address], [place], [place] [address] [phone] M-F 8-5 Medicaid- yes; Tricare - yes  [facility] - Betsy Coder, [place] and Chalmers Guest, MD; Terrial Rhodes, MD; Hulda Humphrey, MD; Roseanne Reno, MD; [place], [place]; Ala Dach, MD; Fredia Beets, MD; Dimple Casey, MD [address], [place], [place] [address] [phone] M-F: 8-5, Sat: 9-4, Sun 9-12 Medicaid - yes; Tricare - yes  [facility] [place], PNP; Earlene Plater, PNP [address] [place], [place] [address] [phone] M-F 8 - 5, closed 12-1 for lunch Medicaid - yes; Tricare - yes  [facility] Kathyrn Lass, MD; Hal Neer, MD; Dimple Casey, MD; [place], DO [address] [place], [place] [address] [phone] M-F 8- 5:30 Medicaid - yes; Tricare - yes  [facility]s - Biagio Quint, MD; Rosalia Hammers, MD; Gwenith Daily, MD [address], [place], [place] [address] [phone] Judie Petit: Nicholas Lose; Tues-Fri: 8-5; Sat: 9-12 Medicaid - yes; Tricare - yes  [facility] Pediatrics - Bobbye Morton, MD; Daphane Shepherd, MD; Chestine Spore, MD; Haskell Riling, MD; Kate Sable, MD [address] [place], [place] [address] [phone] Judie PetitMarland Kitchen Nicholas LoseFrancee Nodal: 8-5; Sat: 8:30-12:30 Medicaid - yes; Tricare - yes  [facility]len[facility] Heckle [facility]s - Beckey Rutter, MD; [place], [place] [address] [place], [place] [address] [phone] Mon-Fri: 8-5 Medicaid - yes;  Tricare - yes  Darnelle [facility] Pediatrics - [place], CPNP; [place], [place]; Dimple Casey, MD; Alisa Graff, MD; Cephus Shelling, MD; [address], [place], [place] [address] [phone] M-F: 8-5, closed 1-2 for lunch Medicaid - yes; Tricare - yes  [facility]elle [facility] [place] [facility]s - [facility] [place], [place]; [place], [place]; Katrinka Blazing, MD; [place], CPNP; [place], [place]; [place], MD; Earlene Plater, MD [address], [place], [place] [address] [phone] M-Thurs: Nicholas Lose; Fri: 8-6; Sat: 9-12; Sun 2-4 Medicaid - yes; Tricare - yes  [facility] Georgeanna Lea, MD; Evette Cristal, MD; Shea Stakes, FNP; Earney Mallet, DO; [address] [place], [place] [address] [phone] M-F: 8-5 Medicaid - yes; Tricare - yes  [facility] Pediatric Providers  [facility] Family Medicine -Collene Mares, MD; [place], NP [address] [place], [place] [address] [phone] M - Fri: 8am - 5pm, closed for lunch 12-1 Medicaid - Yes; Tricare - yes  [facility] Elinor Parkinson, MD; Victory Dakin, MD; [place], DO; Vinocur, MD;Hall, PA; Clent Ridges, [place]; Orvan Falconer, NP [phone] [address] [place], [place] [address] [phone] M-F 8:00 - 5:00, Sat 8:00 - 11:30 Medicaid - yes; Tricare - yes  [facility] Welton Flakes, MD; [place], MD, [address] MD, [place], MD, [place], MD; [place], NP; [place], [place];  [address] [place], [place] [address] [phone] M-F 8:10am - 5:00pm Medicaid - yes; Tricare - yes  [facility] [place], MD; [place], NP [address] [place], [place] [address] [phone] M-F 8:00 - 5:00 Medicaid - [place] Medicaid only; Tricare - yes  [facility] Family Medicine - Deep [address] [place], [place]; [place], NP [address], [place], [place] [address] [phone] M-F 8:00 - 5:00; Closed for lunch 12 - 1:00 Medicaid - yes;  Tricare - yes  [facility] Belva Crome, MD; Jonita Albee, FNP [address], [place], [place] [address] [phone] Mon 9-5; Tues/Wed 10-5; Thurs 8:30-5; Fri: 8-12:30 Medicaid - yes; Tricare - yes  [facility] Pediatric Providers  [facility]  [place], MD; [place], [place] [address] [place], [place] [address] [phone] phone [phone] fax M-F 7:15 - 4:30 Medicaid - yes; Tricare - yes  [facility] Karilyn Cota, MD; [place], DO [address] [place], [place] [address] [phone] M-Fri: 8:30 - 5:00, closed for lunch everyday noon - 1pm Medicaid - Yes; Tricare - yes  [facility] Burdine, MD; Reuel Boom, MD; Dimas Aguas, MD; Neita Carp, MD; [place], [place]; Bonnita Nasuti, [place]; [place], [place]; [place], [place]; [place], [place] [address] [place], [place] [address] [phone] M-Thurs: 7:30am - 7:00pm; Friday 7:30am - 4pm; Sat: 8:00 - 1:00 Medicaid - Yes; Tricare - yes  [facility] Morton, MD; Conni Elliot, MD; Carroll Kinds, MD; [place], DO [address], [place], [place] [address] [phone] M-Thur: 8:00 - 5:00, Fri: 8:00 - Noon Medicaid - yes; Tricare - yes No [place] [employer]  [place] [facility] Family Medicine Dettinger, MD; Nadine Counts, DO; [place], NP; Daphine Deutscher, NP; Lequita Halt, NP; Ellamae Sia, NP; Reginia Forts, NP; Darlyn Read, MD; [place], [place] [address] [place], [place] [address] [phone] M-F 8:00 - 5:00 Medicaid - yes; Tricare - yes  [facility], FNP-C; [place], FNP-C [address] [place], [place] [address] [phone] M, W, R 8:00-5:00, Tues: 8:00am - 7:00pm; Fri 8:00 - noon Medicaid - Yes; Tricare - yes  [facility], MD [address] [place], [place] [address] [phone]  M-Thurs 8:30-5:30, Fri: 8:30-12:30pm Medicaid - Yes; Tricare - N   Remember that at your next visit you will get labs drawn. You need to be  fasting (nothing to eat or drink for at least 8 hours prior to arrival) for those labs. We will be testing for gestational diabetes, anemia, HIV and syphilis. You will be in the office for at least 2 hours to complete the lab tests and your visit will occur during this time. You will also be offered the TDAP vaccine for whooping cough.  TDaP Vaccine Pregnancy Get the Whooping Cough Vaccine While You Are Pregnant (CDC)  It is important for women to get the whooping cough vaccine in the third trimester of each pregnancy. Vaccines are the best way to prevent this disease. There are 2 different whooping cough vaccines. Both vaccines combine protection against whooping cough, tetanus and diphtheria, but they are for different age groups: Tdap: for everyone 11 years or older, including pregnant women  DTaP: for children 2 months through 22 years of age  You need the whooping cough vaccine during each of your pregnancies The recommended time to get the shot is during your 27th through 36th week of pregnancy, preferably during the earlier part of this time period. The Centers for Disease Control and Prevention (CDC) recommends that pregnant women receive the whooping cough vaccine for adolescents and  adults (called Tdap vaccine) during the third trimester of each pregnancy. The recommended time to get the shot is during your 27th through 36th week of pregnancy, preferably during the earlier part of this time period. This replaces the original recommendation that pregnant women get the vaccine only if they had not previously received it. The [employer] and the [employer] support this recommendation.  You should get the whooping cough vaccine while pregnant to pass protection to your baby frame support disabled and/or not supported in this browser  Learn why Vernona Rieger decided to get the whooping cough vaccine in her 3rd trimester of pregnancy and how  her baby girl was born with some protection against the disease. Also available on YouTube. After receiving the whooping cough vaccine, your body will create protective antibodies (proteins produced by the body to fight off diseases) and pass some of them to your baby before birth. These antibodies provide your baby some short-term protection against whooping cough in early life. These antibodies can also protect your baby from some of the more serious complications that come along with whooping cough. Your protective antibodies are at their highest about 2 weeks after getting the vaccine, but it takes time to pass them to your baby. So the preferred time to get the whooping cough vaccine is early in your third trimester. The amount of whooping cough antibodies in your body decreases over time. That is why CDC recommends you get a whooping cough vaccine during each pregnancy. Doing so allows each of your babies to get the greatest number of protective antibodies from you. This means each of your babies will get the best protection possible against this disease.  Getting the whooping cough vaccine while pregnant is better than getting the vaccine after you give birth Whooping cough vaccination during pregnancy is ideal so your baby will have short-term protection as soon as he is born. This early protection is important because your baby will not start getting his whooping cough vaccines until he is 2 months old. These first few months of life are when your baby is at greatest risk for catching whooping cough. This is also when he's at greatest risk for having severe, potentially life-threating complications from the infection. To avoid that gap in protection, it is best to get a whooping cough vaccine during pregnancy. You will then pass protection to your baby before he is born. To continue protecting your baby, he should get whooping cough vaccines starting at 2 months old. You may never have gotten the  Tdap vaccine before and did not get it during this pregnancy. If so, you should make sure to get the vaccine immediately after you give birth, before leaving the hospital or birthing center. It will take about 2 weeks before your body develops protection (antibodies) in response to the vaccine. Once you have protection from the vaccine, you are less likely to give whooping cough to your newborn while caring for him. But remember, your baby will still be at risk for catching whooping cough from others. A recent study looked to see how effective Tdap was at preventing whooping cough in babies whose mothers got the vaccine while pregnant or in the hospital after giving birth. The study found that getting Tdap between 27 through 36 weeks of pregnancy is 85% more effective at preventing whooping cough in babies younger than 2 months old. Blood tests cannot tell if you need a whooping cough vaccine There are no  blood tests that can tell you if you have enough antibodies in your body to protect yourself or your baby against whooping cough. Even if you have been sick with whooping cough in the past or previously received the vaccine, you still should get the vaccine during each pregnancy. Breastfeeding may pass some protective antibodies onto your baby By breastfeeding, you may pass some antibodies you have made in response to the vaccine to your baby. When you get a whooping cough vaccine during your pregnancy, you will have antibodies in your breast milk that you can share with your baby as soon as your milk comes in. However, your baby will not get protective antibodies immediately if you wait to get the whooping cough vaccine until after delivering your baby. This is because it takes about 2 weeks for your body to create antibodies. Learn more about the health benefits of breastfeeding.

## 2023-05-02 NOTE — Progress Notes (Signed)
   PRENATAL VISIT NOTE  Subjective:  Gina Hurley is a 33 y.o. [phone] at [redacted]w[redacted]d being seen today for ongoing prenatal care.  She is currently monitored for the following issues for this high-risk pregnancy and has Supervision of high risk pregnancy, antepartum; Bipolar 1 disorder (HCC); Chronic hypertension affecting pregnancy; BMI 39.0-39.9,adult; Obesity in pregnancy; Late prenatal care affecting pregnancy, antepartum; Late latent syphilis; Chlamydia trachomatis infection in pregnancy in second trimester; UTI in pregnancy, antepartum, second trimester; High grade squamous intraepithelial lesion (HGSIL), grade 3 CIN, on biopsy of cervix; and Low-lying placenta on their problem list.  Patient reports no complaints.  Contractions: Not present. Vag. Bleeding: None.  Movement: Present. Denies leaking of fluid.   The following portions of the patient's history were reviewed and updated as appropriate: allergies, current medications, past family history, past medical history, past social history, past surgical history and problem list.   Objective:   Vitals:   05/02/23 1053  BP: 124/81  Pulse: 89  Weight: 242 lb 11.2 oz (110.1 kg)    Fetal Status: Fetal Heart Rate (bpm): 146 Fundal Height: 23 cm Movement: Present     General:  Alert, oriented and cooperative. Patient is in no acute distress.  Skin: Skin is warm and dry. No rash noted.   Cardiovascular: Normal heart rate noted  Respiratory: Normal respiratory effort, no problems with respiration noted  Abdomen: Soft, gravid, appropriate for gestational age.  Pain/Pressure: Absent     Pelvic: Cervical exam deferred        Extremities: Normal range of motion.  Edema: Trace  Mental Status: Normal mood and affect. Normal behavior. Normal judgment and thought content.   Assessment and Plan:  Pregnancy: G4P3003 at [redacted]w[redacted]d 1. Supervision of high risk pregnancy, antepartum (Primary) - anticipatory guidance for fasting labs and TDAP offered at next  visit  2. Late prenatal care affecting pregnancy, antepartum  3. Chronic hypertension affecting pregnancy - normotensive today   4. Bipolar 1 disorder (HCC)  5. High grade squamous intraepithelial lesion (HGSIL), grade 3 CIN, on biopsy of cervix - discussed results of [place] and need for LEEP PP   6. Low-lying placenta - Repeat US 4/22 with MFM   7. UTI in pregnancy, antepartum, second trimester - Culture, OB Urine - TOC   8. Chlamydia trachomatis infection in pregnancy in second trimester - Cervicovaginal ancillary only( [place]) - TOC   9. Late latent syphilis - Treated   10. Obesity in pregnancy  11. [redacted] weeks gestation of pregnancy  Preterm labor symptoms and general obstetric precautions including but not limited to vaginal bleeding, contractions, leaking of fluid and fetal movement were reviewed in detail with the patient. Please refer to After Visit Summary for other counseling recommendations.   Return in about 2 weeks (around 05/16/2023) for any provider, 28 week labs (fasting), HOB APP.  Future Appointments  Date Time Provider Department Center  05/13/2023  9:15 AM [facility] NURSE [facility]  05/13/2023  9:30 AM [facility] US3 [facility]    Vonzella Nipple, PA-C

## 2023-05-04 LAB — CERVICOVAGINAL ANCILLARY ONLY
Chlamydia: NEGATIVE
Comment: NEGATIVE
Comment: NORMAL
Neisseria Gonorrhea: NEGATIVE

## 2023-05-13 ENCOUNTER — Ambulatory Visit: Attending: Obstetrics and Gynecology

## 2023-05-13 ENCOUNTER — Ambulatory Visit

## 2023-05-21 ENCOUNTER — Other Ambulatory Visit: Payer: Self-pay

## 2023-05-21 DIAGNOSIS — Z3A26 26 weeks gestation of pregnancy: Secondary | ICD-10-CM

## 2023-05-21 DIAGNOSIS — O0990 Supervision of high risk pregnancy, unspecified, unspecified trimester: Secondary | ICD-10-CM

## 2023-05-23 ENCOUNTER — Other Ambulatory Visit

## 2023-05-23 ENCOUNTER — Ambulatory Visit: Payer: Self-pay | Admitting: Obstetrics & Gynecology

## 2023-05-23 ENCOUNTER — Encounter: Payer: Self-pay | Admitting: Obstetrics & Gynecology

## 2023-05-23 ENCOUNTER — Other Ambulatory Visit: Payer: Self-pay

## 2023-05-23 VITALS — BP 117/78 | HR 92 | Wt 240.0 lb

## 2023-05-23 DIAGNOSIS — O10912 Unspecified pre-existing hypertension complicating pregnancy, second trimester: Secondary | ICD-10-CM

## 2023-05-23 DIAGNOSIS — O4442 Low lying placenta NOS or without hemorrhage, second trimester: Secondary | ICD-10-CM | POA: Diagnosis not present

## 2023-05-23 DIAGNOSIS — O4440 Low lying placenta NOS or without hemorrhage, unspecified trimester: Secondary | ICD-10-CM

## 2023-05-23 DIAGNOSIS — Z23 Encounter for immunization: Secondary | ICD-10-CM | POA: Diagnosis not present

## 2023-05-23 DIAGNOSIS — O0992 Supervision of high risk pregnancy, unspecified, second trimester: Secondary | ICD-10-CM

## 2023-05-23 DIAGNOSIS — O99210 Obesity complicating pregnancy, unspecified trimester: Secondary | ICD-10-CM

## 2023-05-23 DIAGNOSIS — O2342 Unspecified infection of urinary tract in pregnancy, second trimester: Secondary | ICD-10-CM

## 2023-05-23 DIAGNOSIS — Z3A26 26 weeks gestation of pregnancy: Secondary | ICD-10-CM

## 2023-05-23 DIAGNOSIS — O10919 Unspecified pre-existing hypertension complicating pregnancy, unspecified trimester: Secondary | ICD-10-CM

## 2023-05-23 DIAGNOSIS — O99212 Obesity complicating pregnancy, second trimester: Secondary | ICD-10-CM

## 2023-05-23 DIAGNOSIS — A528 Late syphilis, latent: Secondary | ICD-10-CM | POA: Diagnosis not present

## 2023-05-23 DIAGNOSIS — O0990 Supervision of high risk pregnancy, unspecified, unspecified trimester: Secondary | ICD-10-CM

## 2023-05-23 NOTE — Patient Instructions (Signed)

## 2023-05-23 NOTE — Progress Notes (Signed)
 PRENATAL VISIT NOTE  Subjective:  Gina Hurley is a 33 y.o. [phone] at [redacted]w[redacted]d being seen today for ongoing prenatal care.  She is currently monitored for the following issues for this high-risk pregnancy and has Supervision of high risk pregnancy, antepartum; Bipolar 1 disorder (HCC); Chronic hypertension affecting pregnancy; BMI 39.0-39.9,adult; Obesity in pregnancy; Late prenatal care affecting pregnancy, antepartum; Late latent syphilis; Chlamydia trachomatis infection in pregnancy in second trimester; UTI in pregnancy, antepartum, second trimester; High grade squamous intraepithelial lesion (HGSIL), grade 3 CIN, on biopsy of cervix; and Low-lying placenta  (anterior) on their problem list.  Patient reports no complaints. Here with FOB. Contractions: Not present. Vag. Bleeding: None.  Movement: Present. Denies leaking of fluid.   The following portions of the patient's history were reviewed and updated as appropriate: allergies, current medications, past family history, past medical history, past social history, past surgical history and problem list.   Objective:   Vitals:   05/23/23 0939  BP: 117/78  Pulse: 92  Weight: 240 lb (108.9 kg)    Fetal Status: Fetal Heart Rate (bpm): 147   Movement: Present     General:  Alert, oriented and cooperative. Patient is in no acute distress.  Skin: Skin is warm and dry. No rash noted.   Cardiovascular: Normal heart rate noted  Respiratory: Normal respiratory effort, no problems with respiration noted  Abdomen: Soft, gravid, appropriate for gestational age.  Pain/Pressure: Absent     Pelvic: Cervical exam deferred        Extremities: Normal range of motion.  Edema: Trace  Mental Status: Normal mood and affect. Normal behavior. Normal judgment and thought content.   US  MFM OB DETAIL +14 WK Result Date: 04/08/2023 ----------------------------------------------------------------------  OBSTETRICS REPORT                       (Signed Final  04/08/2023 12:48 pm) ---------------------------------------------------------------------- Patient Info  ID #:       [id number]                          D.O.B.:  [birth year] (32 yrs)(F)  Name:       Gina Hurley                  Visit Date: 04/08/2023 08:33 am ---------------------------------------------------------------------- Performed By  Attending:        Penney Bowling DO       Ref. Address:     [address]                                                             [place], [place]                                                             [address]  Performed By:     Elspeth Hals       [facility]  Referred By:      Raynell Caller MD ---------------------------------------------------------------------- Orders  #  Description                           Code        Ordered By  1  US  MFM OB DETAIL +14 WK               76811.01    CHARLIE PICKENS ----------------------------------------------------------------------  #  Order #                     Accession #                Episode #  1  [id number]                   [id number]                 [id number] ---------------------------------------------------------------------- Indications  Hypertension - Chronic/Pre-existing (no        O10.019  meds)  Obesity complicating pregnancy, second         O99.212  trimester (BMI 38)  Low lying placenta, antepartum                 O44.40  Syphilis complicating pregnancy, second        O98.112  trimester  Late prenatal care, second trimester           O09.32  Encounter for antenatal screening for          Z36.3  malformations  [redacted] weeks gestation of pregnancy                Z3A.19  Declined genetic screening  ---------------------------------------------------------------------- Vital Signs  BP:          129/75 ---------------------------------------------------------------------- Fetal Evaluation  Num Of Fetuses:         1  Fetal Heart Rate(bpm):  149  Cardiac Activity:       Observed  Presentation:           Breech  Placenta:               Anterior, low-lying, 0.9 cm from int os  P. Cord Insertion:      Visualized, central  Amniotic Fluid  AFI FV:      Within normal limits  Largest Pocket(cm)                              4.63 ---------------------------------------------------------------------- Biometry  BPD:      42.3  mm     G. Age:  18w 6d         20  %    CI:        70.81   %    70 - 86                                                          FL/HC:      18.5   %    16.8 - 19.8  HC:      160.2  mm     G. Age:  18w 6d         13  %    HC/AC:      1.05        1.09 - 1.39  AC:      153.3  mm     G. Age:  20w 4d         76  %    FL/BPD:     70.2   %  FL:       29.7  mm     G. Age:  19w 1d         28  %    FL/AC:      19.4   %    20 - 24  HUM:      29.5  mm     G. Age:  19w 5d         54  %  CER:      19.5  mm     G. Age:  19w 0d         37  %  NFT:       3.4  mm  LV:          6  mm  CM:        4.2  mm  Est. FW:     311  gm    0 lb 11 oz      56  % ---------------------------------------------------------------------- OB History  Gravidity:    4         Term:   3        Prem:   0        SAB:   0  TOP:          0       Ectopic:  0        Living: 3 ---------------------------------------------------------------------- Gestational Age  LMP:           19w 4d        Date:  11/22/22                  EDD:   08/29/23  U/S Today:     19w 3d                                        EDD:   08/30/23  Best:  19w 4d     Det. By:  LMP  (11/22/22)          EDD:   08/29/23 ---------------------------------------------------------------------- Targeted Anatomy  Central Nervous System  Calvarium/Cranial V.:   Appears normal         Cereb./Vermis:          Appears normal  Cavum:                 Appears normal         Cisterna Magna:         Appears normal  Lateral Ventricles:    Appears normal         Midline Falx:           Appears normal  Choroid Plexus:        Appears normal  Spine  Cervical:              Not well visualized    Sacral:                 Not well visualized  Thoracic:              Not well visualized    Shape/Curvature:        Not well visualized  Lumbar:                Not well visualized  Head/Neck  Lips:                  Appears normal         Profile:                Appears normal  Neck:                  Appears normal         Orbits/Eyes:            Appears normal  Nuchal Fold:           Appears normal         Mandible:               Appears normal  Nasal Bone:            Present                Maxilla:                Appears normal  Thorax  4 Chamber View:        Appears normal         Interventr. Septum:     Appears normal  Cardiac Rhythm:        Normal                 Cardiac Axis:           Normal  Cardiac Situs:         Appears normal         Diaphragm:              Appears normal  Rt Outflow Tract:      Appears normal         3 Vessel View:          Appears normal  Lt Outflow Tract:      Appears normal         3 V Trachea View:       Appears normal  Aortic Arch:  Appears normal         IVC:                    Appears normal  Ductal Arch:           Appears normal         Crossing:               Appears normal  SVC:                   Appears normal  Abdomen  Ventral Wall:          Appears normal         Lt Kidney:              Appears normal  Cord Insertion:        Appears normal         Rt Kidney:              Appears normal  Situs:                 Appears normal         Bladder:                Appears normal  Stomach:               Appears normal  Extremities  Lt Humerus:            Appears normal         Lt Femur:               Appears normal  Rt Humerus:            Appears normal          Rt Femur:               Appears normal  Lt Forearm:            Appears normal         Lt Lower Leg:           Appears normal  Rt Forearm:            Appears normal         Rt Lower Leg:           Appears normal  Lt Hand:               Open hand nml          Lt Foot:                Nml heel/foot  Rt Hand:               Open hand nml          Rt Foot:                Nml heel/foot  Other  Umbilical Cord:        Normal 3-vessel        Genitalia:              Not well visualized  Comment:     Technically difficult due to fetal position. ---------------------------------------------------------------------- Cervix Uterus Adnexa  Cervix  Length:            4.1  cm.  Normal appearance by transabdominal scan  Uterus  No abnormality visualized.  Right Ovary  Not visualized.  Left Ovary  Not visualized.  Cul De Sac  No free  fluid seen.  Adnexa  No adnexal mass visualized ---------------------------------------------------------------------- Comments  MFM Consult Note  Gina Hurley has a pregnancy with the complications  mentioned in the problem list. During today's visit we focused  on the following concerns:  RE CHTN: no meds except 81 mg ASA.  RE syphilis: s/p treatment. Needs repeat RPR every trimester  or with exposure. Increase in titer suggest re-infection and  need for re-treatment.  RE low-lying placenta: I discussed the increased risk of  bleeding associated with this condition.  This is most likely  that this will resolve prior to delivery.  Vaginal delivery is  preferred if there are no other contraindications.  RE elevated BMI: I discussed the potential complications  associated with obesity in pregnancy.  I discussed the need  for continued growth ultrasounds and possibly antenatal  testing depending upon how the pregnancy course  progresses.  Maternal weight gain should be limited to 10 to  20 pounds during the pregnancy.  While normal weight loss  may occur during the first and early second trimester,  efforts  to actively lose weight with the use of medication is not  recommended during pregnancy.  A whole food diet and  regular exercise of at least 15 to 30 minutes of moderately  strenuous activity is recommended in the absence of any  contraindications. Weight loss with the use of medications is  not recommended during pregnancy.  If other existing  comorbidities are present then 81 mg of aspirin  should be  considered for preeclampsia risk reduction.  Sonographic findings  Single intrauterine pregnancy at 19w 4d  Fetal cardiac activity:  Observed and appears normal.  Presentation: Breech.  The anatomic structures that were well seen appear normal  without evidence of soft markers. Due to poor acoustic  windows some structures remain suboptimally visualized.  Fetal biometry shows the estimated fetal weight at the 56  percentile.  Amniotic fluid: Within normal limits.  MVP: 4.63 cm.  Placenta: Anterior, low-lying, 0.9 cm from int os.  Adnexa: No adnexal mass visualized.  Cervical length: 4.1 cm.  There are limitations of prenatal ultrasound such as the  inability to detect certain abnormalities due to poor  visualization. Various factors such as fetal position,  gestational age and maternal body habitus may increase the  difficulty in visualizing the fetal anatomy.  Recommendations  -EDD should be 08/29/2023 based on  LMP  (11/22/22).  -Follow up ultrasound in 4-6 weeks to attempt visualization of  the anatomy not seen and reassess the fetal growth  -Baseline preeclampsia labs: CMP, CBC and urine  protein/creatinine ratio if not previously completed.  -RPR should be repeated every trimester or sooner if there  are concerns for repeat exposure.  -Early glucose screening due to multiple risk factors.  -Continue Aspirin  81 mg for preeclampsia prophylaxis  -Follow-up anatomy and fetal growth in 4 to 6 weeks  -Serial growth ultrasounds starting around 28 weeks to  monitor for fetal growth restriction  -Antenatal  testing to start around 32 weeks due to the  increased risk of stillbirth and high risk pregnancy  -Avoid digital vaginal exam if there is bleeding due to a low-  lying placenta.  -Continue routine prenatal care with referring OB provider ----------------------------------------------------------------------                  Penney Bowling, DO Electronically Signed Final Report   04/08/2023 12:48 pm ----------------------------------------------------------------------   US  OB Comp Less 14 Wks Result Date: 02/28/2023 CLINICAL  DATA:  Dating EXAM: OBSTETRIC <14 WK ULTRASOUND TECHNIQUE: Transabdominal ultrasound was performed for evaluation of the gestation as well as the maternal uterus and adnexal regions. COMPARISON:  None available FINDINGS: Intrauterine gestational sac: Single Yolk sac:  Not Visualized. Embryo:  Visualized. Cardiac Activity: Visualized. Heart Rate: 166 bpm MSD:    mm    w     d CRL:   76 mm   13 w 5 d                  US  EDC: 12/07/2023 Subchorionic hemorrhage:  None visualized. Maternal uterus/adnexae: Ovaries not visualized. No adnexal mass or free fluid. IMPRESSION: Thirteen week 5 day intrauterine pregnancy. Fetal heart rate 166 beats per minute. No acute maternal findings. Electronically Signed   By: Janeece Mechanic M.D.   On: 02/28/2023 22:21    Assessment and Plan:  Pregnancy: [id number] at [redacted]w[redacted]d 1. Chronic hypertension affecting pregnancy (Primary) Stable BP, no meds. On ASA.  Will check surveillance labs today. Continue close BP monitoring. - Comprehensive metabolic panel with GFR - Protein / creatinine ratio, urine  2. Low-lying placenta  (anterior) 0.9 cm from os, previa/bleeding precautions reviewed.  3. Late latent syphilis Treated, will follow up titer today.  4. UTI in pregnancy, antepartum, second trimester Test of cure being done today. - Culture, OB Urine  5. Obesity in pregnancy Has gained 50 lbs so far.  Advised to modify diet and exercise.  Referred to  Nutritionist. GDM testing being done today, will follow up results and manage accordingly. Discussed other risks of marked weight gain. - Amb Referral to Nutrition and Diabetic Education  6. [redacted] weeks gestation of pregnancy 7. Supervision of high risk pregnancy, antepartum GTT, other labs done today, will follow up results and manage accordingly. Tdap given today. - Tdap vaccine greater than or equal to 7yo IM Medicaid BTL papers signed today. Discussed that it is possible that her procedure may not be done immediately postpartum due to her habitus, decision will be made after delivery.  Preterm labor symptoms and general obstetric precautions including but not limited to vaginal bleeding, contractions, leaking of fluid and fetal movement were reviewed in detail with the patient. Please refer to After Visit Summary for other counseling recommendations.   Return in about 2 weeks (around 06/06/2023) for OFFICE OB VISIT (MD only).  Future Appointments  Date Time Provider Department Center  05/28/2023  1:00 PM [facility] PROVIDER 1 [facility]  05/28/2023  1:30 PM [facility] US2 [facility]    Lenoard Rad, MD

## 2023-05-24 LAB — COMPREHENSIVE METABOLIC PANEL WITH GFR
ALT: 5 IU/L (ref 0–32)
AST: 6 IU/L (ref 0–40)
Albumin: 3.8 g/dL — ABNORMAL LOW (ref 3.9–4.9)
Alkaline Phosphatase: 91 IU/L (ref 44–121)
BUN/Creatinine Ratio: 17 (ref 9–23)
BUN: 9 mg/dL (ref 6–20)
Bilirubin Total: <0.2 mg/dL (ref 0.0–1.2)
CO2: 18 mmol/L — ABNORMAL LOW (ref 20–29)
Calcium: 9.2 mg/dL (ref 8.7–10.2)
Chloride: 102 mmol/L (ref 96–106)
Creatinine, Ser: 0.52 mg/dL — ABNORMAL LOW (ref 0.57–1.00)
Globulin, Total: 2.6 g/dL (ref 1.5–4.5)
Glucose: 142 mg/dL — ABNORMAL HIGH (ref 70–99)
Potassium: 4.4 mmol/L (ref 3.5–5.2)
Sodium: 137 mmol/L (ref 134–144)
Total Protein: 6.4 g/dL (ref 6.0–8.5)
eGFR: 127 mL/min/{1.73_m2} (ref >59)

## 2023-05-24 LAB — PROTEIN / CREATININE RATIO, URINE
Creatinine, Urine: 259.7 mg/dL
Protein, Ur: 17.6 mg/dL
Protein/Creat Ratio: 68 mg/g{creat} (ref 0–200)

## 2023-05-25 LAB — CULTURE, OB URINE

## 2023-05-25 LAB — URINE CULTURE, OB REFLEX

## 2023-05-26 ENCOUNTER — Encounter: Payer: Self-pay | Admitting: Obstetrics & Gynecology

## 2023-05-27 LAB — CBC
Hematocrit: 33.9 % — ABNORMAL LOW (ref 34.0–46.6)
Hemoglobin: 11.4 g/dL (ref 11.1–15.9)
MCH: 31.7 pg (ref 26.6–33.0)
MCHC: 33.6 g/dL (ref 31.5–35.7)
MCV: 94 fL (ref 79–97)
Platelets: 349 10*3/uL (ref 150–450)
RBC: 3.6 x10E6/uL — ABNORMAL LOW (ref 3.77–5.28)
RDW: 12.2 % (ref 11.7–15.4)
WBC: 10.8 10*3/uL (ref 3.4–10.8)

## 2023-05-27 LAB — GLUCOSE TOLERANCE, 2 HOURS W/ 1HR
Glucose, 1 hour: 140 mg/dL (ref 70–179)
Glucose, 2 hour: 114 mg/dL (ref 70–152)
Glucose, Fasting: 98 mg/dL — ABNORMAL HIGH (ref 70–91)

## 2023-05-27 LAB — RPR, QUANT+TP ABS (REFLEX)
Rapid Plasma Reagin, Quant: 1:4 {titer} — ABNORMAL HIGH
T Pallidum Abs: REACTIVE — AB

## 2023-05-27 LAB — RPR: RPR Ser Ql: REACTIVE — AB

## 2023-05-27 LAB — HIV ANTIBODY (ROUTINE TESTING W REFLEX): HIV Screen 4th Generation wRfx: NONREACTIVE

## 2023-05-28 ENCOUNTER — Encounter: Payer: Self-pay | Admitting: Obstetrics & Gynecology

## 2023-05-28 ENCOUNTER — Other Ambulatory Visit: Payer: Self-pay | Admitting: *Deleted

## 2023-05-28 ENCOUNTER — Ambulatory Visit: Attending: Maternal & Fetal Medicine

## 2023-05-28 ENCOUNTER — Telehealth: Payer: Self-pay

## 2023-05-28 ENCOUNTER — Other Ambulatory Visit: Payer: Self-pay | Admitting: Obstetrics & Gynecology

## 2023-05-28 ENCOUNTER — Ambulatory Visit (HOSPITAL_BASED_OUTPATIENT_CLINIC_OR_DEPARTMENT_OTHER): Admitting: Maternal & Fetal Medicine

## 2023-05-28 VITALS — BP 130/66 | HR 90

## 2023-05-28 DIAGNOSIS — O10012 Pre-existing essential hypertension complicating pregnancy, second trimester: Secondary | ICD-10-CM | POA: Diagnosis not present

## 2023-05-28 DIAGNOSIS — O99210 Obesity complicating pregnancy, unspecified trimester: Secondary | ICD-10-CM | POA: Insufficient documentation

## 2023-05-28 DIAGNOSIS — O98112 Syphilis complicating pregnancy, second trimester: Secondary | ICD-10-CM

## 2023-05-28 DIAGNOSIS — O4442 Low lying placenta NOS or without hemorrhage, second trimester: Secondary | ICD-10-CM | POA: Insufficient documentation

## 2023-05-28 DIAGNOSIS — O24419 Gestational diabetes mellitus in pregnancy, unspecified control: Secondary | ICD-10-CM

## 2023-05-28 DIAGNOSIS — O99212 Obesity complicating pregnancy, second trimester: Secondary | ICD-10-CM | POA: Diagnosis not present

## 2023-05-28 DIAGNOSIS — O0990 Supervision of high risk pregnancy, unspecified, unspecified trimester: Secondary | ICD-10-CM | POA: Diagnosis present

## 2023-05-28 DIAGNOSIS — E669 Obesity, unspecified: Secondary | ICD-10-CM

## 2023-05-28 DIAGNOSIS — O10919 Unspecified pre-existing hypertension complicating pregnancy, unspecified trimester: Secondary | ICD-10-CM

## 2023-05-28 DIAGNOSIS — O4440 Low lying placenta NOS or without hemorrhage, unspecified trimester: Secondary | ICD-10-CM | POA: Diagnosis present

## 2023-05-28 DIAGNOSIS — Z3A26 26 weeks gestation of pregnancy: Secondary | ICD-10-CM | POA: Diagnosis present

## 2023-05-28 MED ORDER — ACCU-CHEK SOFTCLIX LANCETS MISC: 12 refills | Status: DC

## 2023-05-28 MED ORDER — ACCU-CHEK GUIDE W/DEVICE KIT: 1.0000 | PACK | Freq: Four times a day (QID) | 0 refills | Status: DC

## 2023-05-28 MED ORDER — ACCU-CHEK GUIDE TEST VI STRP: 1.0000 | ORAL_STRIP | Freq: Four times a day (QID) | 12 refills | Status: DC

## 2023-05-28 NOTE — Progress Notes (Signed)
 Patient information  Patient Name: Gina Hurley  Patient MRN:   [id number]  Referring practice: MFM Referring Provider: [facility])  MFM CONSULT  Gina Hurley is a 33 y.o. [phone] at [redacted]w[redacted]d here for ultrasound and consultation. Patient Active Problem List   Diagnosis Date Noted   Gestational diabetes mellitus, antepartum 05/28/2023   Low-lying placenta  (resolved) 04/08/2023   Chlamydia trachomatis infection in pregnancy in second trimester 03/12/2023   UTI in pregnancy, antepartum, second trimester 03/12/2023   High grade squamous intraepithelial lesion (HGSIL), grade 3 CIN, on biopsy of cervix 03/12/2023   Late latent syphilis 03/10/2023   Chronic hypertension affecting pregnancy 03/05/2023   BMI 39.0-39.9,adult 03/05/2023   Obesity in pregnancy 03/05/2023   Late prenatal care affecting pregnancy, antepartum 03/05/2023   Supervision of high risk pregnancy, antepartum 02/25/2023   Bipolar 1 disorder (HCC)     Gina Hurley has a pregnancy with the complications mentioned in the problem list. During today's visit we focused on the following concerns:   The patient was recently diagnosed with gestational diabetes but she has yet to receive her glucose testing supplies.  I encouraged her to stop by her OB providers office on the way out to request her glucose supplies as well as start her diabetic classes.  I discussed the importance of proper glycemic control during pregnancy and the possibility of requiring medication if diet alone is insufficient to achieve proper glycemic control.  Delivery timing will be based on her blood sugars and estimated fetal weight but likely around 38 or 39 weeks or sooner if needed.  I discussed the patient's low-lying placenta has also resolved.  Sonographic findings Single intrauterine pregnancy at 26w 5d.  Fetal cardiac activity:  Observed and appears normal. Presentation: Cephalic. Interval fetal anatomy appears  normal. Fetal biometry shows the estimated fetal weight at the 61 percentile. Amniotic fluid volume: Within normal limits. MVP: 5.32 cm. Placenta: Anterior.  There are limitations of prenatal ultrasound such as the inability to detect certain abnormalities due to poor visualization. Various factors such as fetal position, gestational age and maternal body habitus may increase the difficulty in visualizing the fetal anatomy.    Recommendations 1. Serial growth ultrasounds every 4 weeks until delivery 2. Antenatal testing to start around 32 weeks  3.  Reminded patient to call about her glucose supplies and to start checking her blood sugars  Review of Systems: A review of systems was performed and was negative except per HPI   Vitals and Physical Exam    05/28/2023    1:04 PM 05/23/2023    9:39 AM 05/02/2023   10:53 AM  Vitals with BMI  Weight  240 lbs 242 lbs 11 oz  Systolic 130 117 147  Diastolic 66 78 81  Pulse 90 92 89    Sitting comfortably on the sonogram table Nonlabored breathing Normal rate and rhythm Abdomen is nontender  Past pregnancies OB History  Gravida Para Term Preterm AB Living  4 3 3   3   SAB IAB Ectopic Multiple Live Births      3    # Outcome Date GA Lbr Len/2nd Weight Sex Type Anes PTL Lv  4 Current           3 Term 03/23/14 [redacted]w[redacted]d  9 lb 13 oz (4.451 kg) M Vag-Spont None  LIV     Birth Comments: wnl  2 Term 09/28/12 [redacted]w[redacted]d 12:18 / 00:15 8 lb 14.2  oz (4.03 kg) M Vag-Spont EPI  LIV  1 Term 06/03/10 [redacted]w[redacted]d 14:00 8 lb 4 oz (3.742 kg) F Vag-Spont EPI N LIV     Birth Comments: gall bladder issues     I spent 20 minutes reviewing the patients chart, including labs and images as well as counseling the patient about her medical conditions. Greater than 50% of the time was spent in direct face-to-face patient counseling.  Penney Bowling, DO Maternal fetal medicine, [place]   05/28/2023  1:59 PM

## 2023-05-28 NOTE — Progress Notes (Signed)
 Patient's 2 hr GTT is abnormal and is consistent with Gestational Diabetes DM education referral and testing supplies ordered. Please call to inform patient of results and recommendations.  Lenoard Rad, MD

## 2023-05-28 NOTE — Telephone Encounter (Signed)
 Appt. Scheduled on May 8th at 8:15.   Dino Frank, Charity fundraiser

## 2023-05-28 NOTE — Telephone Encounter (Addendum)
-----   Message from Lenoard Rad sent at 05/28/2023  8:41 AM EDT ----- Patient's 2 hr GTT is abnormal and is consistent with Gestational Diabetes DM education referral and testing supplies ordered. Please call to inform patient of results and recommendations.  Lenoard Rad, MD   Left voicemail for pt to call the office back to discuss results.   Dino Frank, Charity fundraiser

## 2023-05-29 ENCOUNTER — Other Ambulatory Visit: Payer: Self-pay

## 2023-05-29 ENCOUNTER — Encounter: Attending: Obstetrics & Gynecology | Admitting: Dietician

## 2023-05-29 ENCOUNTER — Ambulatory Visit

## 2023-05-29 DIAGNOSIS — O24419 Gestational diabetes mellitus in pregnancy, unspecified control: Secondary | ICD-10-CM

## 2023-05-29 DIAGNOSIS — Z713 Dietary counseling and surveillance: Secondary | ICD-10-CM | POA: Diagnosis not present

## 2023-05-29 DIAGNOSIS — O99210 Obesity complicating pregnancy, unspecified trimester: Secondary | ICD-10-CM | POA: Diagnosis present

## 2023-05-29 DIAGNOSIS — Z3A26 26 weeks gestation of pregnancy: Secondary | ICD-10-CM

## 2023-05-29 DIAGNOSIS — Z3A00 Weeks of gestation of pregnancy not specified: Secondary | ICD-10-CM | POA: Insufficient documentation

## 2023-05-29 NOTE — Progress Notes (Signed)
 Patient was seen for Gestational Diabetes on 05/29/2023  Start time 0818 and End time 0920  Estimated due date: 08/29/2023; [redacted]w[redacted]d   Clinical: Medications:  Current Outpatient Medications:    Accu-Chek Softclix Lancets lancets, Use as instructed, Disp: 100 each, Rfl: 12   aspirin  EC 81 MG tablet, Take 1 tablet (81 mg total) by mouth daily., Disp: 60 tablet, Rfl: 1   Blood Glucose Monitoring Suppl (ACCU-CHEK GUIDE) w/Device KIT, 1 Device by Does not apply route 4 (four) times daily., Disp: 1 kit, Rfl: 0   glucose blood (ACCU-CHEK GUIDE TEST) test strip, 1 each by Other route 4 (four) times daily. Use as instructed, Disp: 100 each, Rfl: 12   Prenatal Vit-Fe Fumarate-FA (PRENATAL VITAMINS PO), Take 1 tablet by mouth daily at 6 (six) AM., Disp: , Rfl:   Medical History:  Past Medical History:  Diagnosis Date   Bipolar 1 disorder (HCC)    Gallbladder attack    Mental disorder     Labs: OGTT fasting 98, 1 hour 140, 2 hour 114 on 05/23/2023 Lab Results  Component Value Date   HGBA1C 5.3 03/05/2023    Dietary and Lifestyle History: Pt presents today with meter kit, strips, lancets and her significant other. Pt denies previous GDM. Pt reports she receives SNAP and WIC benefits. Pt reports she does the shopping and cooking is shared. Pt reports she has made the following changes including decreasing sugary sweetened beverages over the past few days. All Pt's questions were answered during today encounter.   Physical Activity: walking 3-4 days weekly for 60 minutes  Stress: 4-5 out of 10 / self care includes walking, napping Sleep: ok average hour nightly 7 per Pt  24 hr Recall:  First Meal:  coffee with sugar and flavored creamer, omelet made with cheese, sometime grits Snack:  none Second meal:  chicken salad made with mayo, 1 slice of whole wheat bread, 5 crackers, water Snack:  banana or orange or apple or mango Third meal:  carrots, potatoes, greens, 1 salmon patties, water Snack:   none Beverages:  water, coffee with sugar and flavored creamer, 1% milk, apple juice  NUTRITION INTERVENTION  Nutrition education (E-1) on the following topics:   Initial Follow-up  [x]  []  Definition of Gestational Diabetes [x]  []  Why dietary management is important in controlling blood glucose [x]  []  Effects each nutrient has on blood glucose levels [x]  []  Simple carbohydrates vs complex carbohydrates [x]  []  Fluid intake [x]  []  Creating a balanced meal plan [x]  []  Carbohydrate counting  [x]  []  When to check blood glucose levels [x]  []  Proper blood glucose monitoring techniques [x]  []  Effect of stress and stress reduction techniques  [x]  []  Exercise effect on blood glucose levels, appropriate exercise during pregnancy [x]  []  Importance of limiting caffeine and abstaining from alcohol and smoking [x]  []  Medications used for blood sugar control during pregnancy [x]  []  Hypoglycemia and rule of 15 [x]  []  Postpartum self care   Patient has a meter prior to visit. Patient is instructed to begin testing pre breakfast and 2 hours after each meal Blood sugar during today encounter: 97 mg/dL, reported as fasting per Pt   Patient instructed to monitor glucose levels: QID FBS: 60 - <= 95 mg/dL; 2 hour: <= 161 mg/dL  Patient received handouts: Nutrition Diabetes and Pregnancy Carbohydrate Counting List Blood glucose log Snack ideas for diabetes during pregnancy  Patient will be seen for follow-up as needed.

## 2023-06-10 ENCOUNTER — Ambulatory Visit: Admitting: Obstetrics and Gynecology

## 2023-06-10 ENCOUNTER — Encounter: Payer: Self-pay | Admitting: Obstetrics and Gynecology

## 2023-06-10 VITALS — BP 115/75 | HR 96 | Wt 245.3 lb

## 2023-06-10 DIAGNOSIS — O24410 Gestational diabetes mellitus in pregnancy, diet controlled: Secondary | ICD-10-CM

## 2023-06-10 DIAGNOSIS — Z3A28 28 weeks gestation of pregnancy: Secondary | ICD-10-CM

## 2023-06-10 DIAGNOSIS — O2343 Unspecified infection of urinary tract in pregnancy, third trimester: Secondary | ICD-10-CM

## 2023-06-10 DIAGNOSIS — O4440 Low lying placenta NOS or without hemorrhage, unspecified trimester: Secondary | ICD-10-CM

## 2023-06-10 DIAGNOSIS — Z6839 Body mass index (BMI) 39.0-39.9, adult: Secondary | ICD-10-CM | POA: Diagnosis not present

## 2023-06-10 DIAGNOSIS — A528 Late syphilis, latent: Secondary | ICD-10-CM

## 2023-06-10 DIAGNOSIS — O0933 Supervision of pregnancy with insufficient antenatal care, third trimester: Secondary | ICD-10-CM

## 2023-06-10 DIAGNOSIS — O4443 Low lying placenta NOS or without hemorrhage, third trimester: Secondary | ICD-10-CM

## 2023-06-10 DIAGNOSIS — O0992 Supervision of high risk pregnancy, unspecified, second trimester: Secondary | ICD-10-CM

## 2023-06-10 DIAGNOSIS — O10919 Unspecified pre-existing hypertension complicating pregnancy, unspecified trimester: Secondary | ICD-10-CM

## 2023-06-10 DIAGNOSIS — O10913 Unspecified pre-existing hypertension complicating pregnancy, third trimester: Secondary | ICD-10-CM

## 2023-06-10 DIAGNOSIS — O0930 Supervision of pregnancy with insufficient antenatal care, unspecified trimester: Secondary | ICD-10-CM

## 2023-06-10 DIAGNOSIS — D069 Carcinoma in situ of cervix, unspecified: Secondary | ICD-10-CM

## 2023-06-10 DIAGNOSIS — O2342 Unspecified infection of urinary tract in pregnancy, second trimester: Secondary | ICD-10-CM

## 2023-06-10 DIAGNOSIS — O0990 Supervision of high risk pregnancy, unspecified, unspecified trimester: Secondary | ICD-10-CM

## 2023-06-10 MED ORDER — INSULIN PEN NEEDLE 32G X 4 MM MISC: 1 refills | Status: DC

## 2023-06-10 MED ORDER — INSULIN GLARGINE 100 UNIT/ML SOLOSTAR PEN: 6.0000 [IU] | PEN_INJECTOR | Freq: Every day | SUBCUTANEOUS | 1 refills | Status: DC

## 2023-06-10 NOTE — Progress Notes (Signed)
   PRENATAL VISIT NOTE  Subjective:  Gina Hurley is a 33 y.o. [phone] at [redacted]w[redacted]d being seen today for ongoing prenatal care.  She is currently monitored for the following issues for this high-risk pregnancy and has Supervision of high risk pregnancy, antepartum; Bipolar 1 disorder (HCC); Chronic hypertension affecting pregnancy; BMI 39.0-39.9,adult; Obesity in pregnancy; Late prenatal care affecting pregnancy, antepartum; Late latent syphilis; Chlamydia trachomatis infection in pregnancy in second trimester; UTI in pregnancy, antepartum, second trimester; High grade squamous intraepithelial lesion (HGSIL), grade 3 CIN, on biopsy of cervix; Low-lying placenta  (resolved); and Gestational diabetes mellitus, antepartum on their problem list.  Patient reports fatigue.  Contractions: Not present. Vag. Bleeding: None.  Movement: Present. Denies leaking of fluid.   The following portions of the patient's history were reviewed and updated as appropriate: allergies, current medications, past family history, past medical history, past social history, past surgical history and problem list.   Objective:    Vitals:   06/10/23 1408  BP: 115/75  Pulse: 96  Weight: 245 lb 4.8 oz (111.3 kg)    Fetal Status:  Fetal Heart Rate (bpm): 143   Movement: Present    General: Alert, oriented and cooperative. Patient is in no acute distress.  Skin: Skin is warm and dry. No rash noted.   Cardiovascular: Normal heart rate noted  Respiratory: Normal respiratory effort, no problems with respiration noted  Abdomen: Soft, gravid, appropriate for gestational age.  Pain/Pressure: Present     Pelvic: Cervical exam deferred        Extremities: Normal range of motion.  Edema: Trace  Mental Status: Normal mood and affect. Normal behavior. Normal judgment and thought content.   Assessment and Plan:  Pregnancy: G4P3003 at [redacted]w[redacted]d  1. UTI in pregnancy, antepartum, second trimester (Primary) TOC negative  2. Supervision  of high risk pregnancy, antepartum  3. BMI 39.0-39.9,adult  4. Chronic hypertension affecting pregnancy On baby ASA  5. Diet controlled gestational diabetes mellitus (GDM), antepartum Dx two weeks ago All fasting CBGs are high, PP mostly iwthin range Patient is agreeable to start lantus, 6 units at bedtime sent to pharmacy and referred to teaching  6. Late prenatal care affecting pregnancy, antepartum  7. High grade squamous intraepithelial lesion (HGSIL), grade 3 CIN, on biopsy of cervix Needs LEEP pp  8. Low-lying placenta  (resolved) resolved  9. [redacted] weeks gestation of pregnancy  10. Late latent syphilis S/p treatment x3    Preterm labor symptoms and general obstetric precautions including but not limited to vaginal bleeding, contractions, leaking of fluid and fetal movement were reviewed in detail with the patient. Please refer to After Visit Summary for other counseling recommendations.   Return in about 2 weeks (around 06/24/2023) for high OB.  Future Appointments  Date Time Provider Department Center  06/25/2023 11:00 AM [facility] PROVIDER 1 [facility]  06/25/2023 11:30 AM [facility] US1 [facility]  06/26/2023  9:15 AM [facility]-EDUCATION [facility]  07/02/2023 10:15 AM [facility] PROVIDER 1 [facility]  07/02/2023 10:45 AM [facility] NST [facility]  07/09/2023 10:15 AM [facility] PROVIDER 1 [facility]  07/09/2023 10:45 AM [facility] NST [facility]  07/16/2023 10:15 AM [facility] PROVIDER 1 [facility]  07/16/2023 10:45 AM [facility] NST [facility]  07/23/2023 11:00 AM [facility] PROVIDER 1 [facility]  07/23/2023 11:30 AM [facility] US2 [facility]    Jan Mcgill, MD

## 2023-06-11 NOTE — Progress Notes (Signed)
 Patient was seen for Gestational Diabetes on 06/12/2023  Start time 1005 and End time 1055  Estimated due date: 08/29/2023; [redacted]w[redacted]d   Clinical: Medications:  Current Outpatient Medications:    Accu-Chek Softclix Lancets lancets, Use as instructed, Disp: 100 each, Rfl: 12   aspirin  EC 81 MG tablet, Take 1 tablet (81 mg total) by mouth daily., Disp: 60 tablet, Rfl: 1   Blood Glucose Monitoring Suppl (ACCU-CHEK GUIDE) w/Device KIT, 1 Device by Does not apply route 4 (four) times daily., Disp: 1 kit, Rfl: 0   glucose blood (ACCU-CHEK GUIDE TEST) test strip, 1 each by Other route 4 (four) times daily. Use as instructed, Disp: 100 each, Rfl: 12   insulin glargine (LANTUS) 100 UNIT/ML Solostar Pen, Inject 6 Units into the skin at bedtime., Disp: 6 mL, Rfl: 1   Insulin Pen Needle 32G X 4 MM MISC, Please use as directed, Disp: 100 each, Rfl: 1   Prenatal Vit-Fe Fumarate-FA (PRENATAL VITAMINS PO), Take 1 tablet by mouth daily at 6 (six) AM., Disp: , Rfl:   Medical History:  Past Medical History:  Diagnosis Date   Bipolar 1 disorder (HCC)    Gallbladder attack    Mental disorder     Labs: OGTT fasting 98, 1 hour 140, 2 hour 114 on 05/23/2023 Lab Results  Component Value Date   HGBA1C 5.3 03/05/2023    Dietary and Lifestyle History: Pt presents today for insulin instruction with her significant other, insulin pen needles prescription and blood sugar log sheet. Pt reports she has finally received her SNAP benefits today. Pt reports she has been reviewing nutrition facts labels for total carbohydrates. Pt reports she picked up the pen needles yesterday stating her insulin was "out of stock" and plans to contact them again today.  Pt reports switching from sugar added to her coffee to now selecting splenda. Pt reports she has decreased her juice intake successfully. Pt was provided insulin instruction and was able to demonstrate her ability to accurately prime, measure and perform an mock insulin  injection using insulin demonstration tools during today's encounter. RD spoke with pharmacy rep "Chase" who states Lantus will be ready to pick up at 6 pm today. RD instructed Pt to request transfer to another pharmacy if her insulin remains out of stock and/or call the office. All Pt's questions were answered during today encounter.   Physical Activity: walking 5-7 days weekly for 60 minutes  Stress: 5 out of 10 / self care includes walking, napping Sleep: interrupted; average 7 hours nightly  24 hr Recall:  First Meal: 2 mini sausage biscuit, water (102 mg/dL; 2 hour post prandial per Pt)  Snack: none Second meal: fish, slaw, potato salad, water, (88 mg/dL; 2 hour post prandial per Pt)   water Snack: juice, peanut butter with crackers  Third meal: chicken, white rice, broccoli, dr pepper zero sugar (108 mg/dL; 2 hour post prandial per Pt)  Snack: none Beverages:  water, coffee with splenda and flavored creamer, dr pepper zero sugar, 1% milk   NUTRITION INTERVENTION  Nutrition education (E-1) on the following topics:   Initial Follow-up  [x]  []  Definition of Gestational Diabetes [x]  []  Why dietary management is important in controlling blood glucose [x]  []  Effects each nutrient has on blood glucose levels [x]  []  Simple carbohydrates vs complex carbohydrates [x]  [x]  Fluid intake [x]  [x]  Creating a balanced meal plan [x]  [x]  Carbohydrate counting  [x]  [x]  When to check blood glucose levels [x]  []  Proper blood glucose monitoring  techniques [x]  []  Effect of stress and stress reduction techniques  [x]  [x]  Exercise effect on blood glucose levels, appropriate exercise during pregnancy [x]  []  Importance of limiting caffeine and abstaining from alcohol and smoking [x]  [x]  Medications used for blood sugar control during pregnancy [x]  [x]  Hypoglycemia and rule of 15 [x]  []  Postpartum self care   Patient has a meter prior to visit. Patient is instructed to begin testing pre breakfast and 2  hours after each meal    Patient instructed to monitor glucose levels: QID FBS: 60 - <= 95 mg/dL; 2 hour: <= 161 mg/dL  Patient received handouts: How to Use an Insulin Pen-learning about diabetes Hypoglycemia  Patient will be seen for follow-up on 07/17/2023

## 2023-06-12 ENCOUNTER — Encounter: Admitting: Dietician

## 2023-06-12 ENCOUNTER — Other Ambulatory Visit: Payer: Self-pay

## 2023-06-12 ENCOUNTER — Ambulatory Visit: Admitting: Dietician

## 2023-06-12 DIAGNOSIS — O24410 Gestational diabetes mellitus in pregnancy, diet controlled: Secondary | ICD-10-CM

## 2023-06-12 DIAGNOSIS — Z3A28 28 weeks gestation of pregnancy: Secondary | ICD-10-CM

## 2023-06-12 DIAGNOSIS — O99210 Obesity complicating pregnancy, unspecified trimester: Secondary | ICD-10-CM | POA: Diagnosis not present

## 2023-06-17 ENCOUNTER — Telehealth: Payer: Self-pay | Admitting: Family Medicine

## 2023-06-17 NOTE — Telephone Encounter (Signed)
 Patient called in stating she is taking the new insulin  we prescribed and is now have bumps all over her arms and it itches a lot.

## 2023-06-23 ENCOUNTER — Other Ambulatory Visit: Payer: Self-pay | Admitting: Obstetrics & Gynecology

## 2023-06-23 ENCOUNTER — Ambulatory Visit: Admitting: Family Medicine

## 2023-06-23 VITALS — BP 120/77 | HR 99 | Wt 243.7 lb

## 2023-06-23 DIAGNOSIS — Z6839 Body mass index (BMI) 39.0-39.9, adult: Secondary | ICD-10-CM

## 2023-06-23 DIAGNOSIS — A528 Late syphilis, latent: Secondary | ICD-10-CM

## 2023-06-23 DIAGNOSIS — D069 Carcinoma in situ of cervix, unspecified: Secondary | ICD-10-CM | POA: Diagnosis not present

## 2023-06-23 DIAGNOSIS — O24419 Gestational diabetes mellitus in pregnancy, unspecified control: Secondary | ICD-10-CM

## 2023-06-23 DIAGNOSIS — O0990 Supervision of high risk pregnancy, unspecified, unspecified trimester: Secondary | ICD-10-CM

## 2023-06-23 DIAGNOSIS — O9A113 Malignant neoplasm complicating pregnancy, third trimester: Secondary | ICD-10-CM | POA: Diagnosis not present

## 2023-06-23 DIAGNOSIS — O10013 Pre-existing essential hypertension complicating pregnancy, third trimester: Secondary | ICD-10-CM | POA: Diagnosis not present

## 2023-06-23 DIAGNOSIS — O0930 Supervision of pregnancy with insufficient antenatal care, unspecified trimester: Secondary | ICD-10-CM

## 2023-06-23 DIAGNOSIS — O4443 Low lying placenta NOS or without hemorrhage, third trimester: Secondary | ICD-10-CM | POA: Diagnosis not present

## 2023-06-23 DIAGNOSIS — O10919 Unspecified pre-existing hypertension complicating pregnancy, unspecified trimester: Secondary | ICD-10-CM

## 2023-06-23 DIAGNOSIS — O4440 Low lying placenta NOS or without hemorrhage, unspecified trimester: Secondary | ICD-10-CM

## 2023-06-23 DIAGNOSIS — Z3A30 30 weeks gestation of pregnancy: Secondary | ICD-10-CM

## 2023-06-23 DIAGNOSIS — O98113 Syphilis complicating pregnancy, third trimester: Secondary | ICD-10-CM

## 2023-06-23 MED ORDER — INSULIN GLARGINE 100 UNIT/ML SOLOSTAR PEN
9.0000 [IU] | PEN_INJECTOR | Freq: Every day | SUBCUTANEOUS | 1 refills | Status: DC
Start: 2023-06-23 — End: 2023-07-11

## 2023-06-23 NOTE — Progress Notes (Signed)
   PRENATAL VISIT NOTE  Subjective:  Gina Hurley is a 33 y.o. [phone] at [redacted]w[redacted]d being seen today for ongoing prenatal care.  She is currently monitored for the following issues for this high-risk pregnancy and has Supervision of high risk pregnancy, antepartum; Bipolar 1 disorder (HCC); Chronic hypertension affecting pregnancy; BMI 39.0-39.9,adult; Obesity in pregnancy; Late prenatal care affecting pregnancy, antepartum; Late latent syphilis; Chlamydia trachomatis infection in pregnancy in second trimester; UTI in pregnancy, antepartum, second trimester; High grade squamous intraepithelial lesion (HGSIL), grade 3 CIN, on biopsy of cervix; Low-lying placenta  (resolved); and Gestational diabetes mellitus, antepartum on their problem list.  Patient doing well with no acute concerns today. She reports no complaints.  Contractions: Irritability. Vag. Bleeding: None.  Movement: Present. Denies leaking of fluid.   The following portions of the patient's history were reviewed and updated as appropriate: allergies, current medications, past family history, past medical history, past social history, past surgical history and problem list. Problem list updated.  Objective:   Vitals:   06/23/23 1452  BP: 120/77  Pulse: 99  Weight: 243 lb 11.2 oz (110.5 kg)    Fetal Status: Fetal Heart Rate (bpm): 140 Fundal Height: 32 cm Movement: Present     General:  Alert, oriented and cooperative. Patient is in no acute distress.  Skin: Skin is warm and dry. No rash noted.   Cardiovascular: Normal heart rate noted  Respiratory: Normal respiratory effort, no problems with respiration noted  Abdomen: Soft, gravid, appropriate for gestational age.  Pain/Pressure: Absent     Pelvic: Cervical exam deferred        Extremities: Normal range of motion.  Edema: Trace  Mental Status:  Normal mood and affect. Normal behavior. Normal judgment and thought content.     Assessment and Plan:  Pregnancy: G4P3003 at  [redacted]w[redacted]d  1. Supervision of high risk pregnancy, antepartum (Primary) 2. [redacted] weeks gestation of pregnancy  3. BMI 39.0-39.9,adult 4. Late prenatal care affecting pregnancy, antepartum 5. Low-lying placenta  (resolved) @[redacted]w[redacted]d  1048g, EFW 61%, AC 83%, cephalic, AFI wnl - serial growths Q4wks until delivery, next on 6/4 with MFM  6. High grade squamous intraepithelial lesion (HGSIL), grade 3 CIN, on biopsy of cervix PP Leep  7. Chronic hypertension affecting pregnancy Normotensive, asymptomatic  8. Late latent syphilis S/p PCN 3/3, 3/10, 3/24 Placenta to pathology for syphilis testing  9. Gestational diabetes mellitus (GDM), antepartum, gestational diabetes method of control unspecified Poorly controlled by log above Increased Lantus  to 9U at bedtime given 32cm fundal height on today's measurements   Preterm labor symptoms and general obstetric precautions including but not limited to vaginal bleeding, contractions, leaking of fluid and fetal movement were reviewed in detail with the patient.  Please refer to After Visit Summary for other counseling recommendations.   Return in about 2 weeks (around 07/07/2023) for [facility].   Candice Chalet, MD Family Medicine - Obstetrics Fellow

## 2023-06-24 NOTE — Telephone Encounter (Signed)
 Patient had an appointment with a OBGYN provider on 06/23/2023.

## 2023-06-25 ENCOUNTER — Ambulatory Visit: Admitting: Maternal & Fetal Medicine

## 2023-06-25 ENCOUNTER — Ambulatory Visit: Attending: Maternal & Fetal Medicine

## 2023-06-25 VITALS — BP 114/59 | HR 86

## 2023-06-25 DIAGNOSIS — O10013 Pre-existing essential hypertension complicating pregnancy, third trimester: Secondary | ICD-10-CM

## 2023-06-25 DIAGNOSIS — E669 Obesity, unspecified: Secondary | ICD-10-CM | POA: Diagnosis not present

## 2023-06-25 DIAGNOSIS — O99213 Obesity complicating pregnancy, third trimester: Secondary | ICD-10-CM

## 2023-06-25 DIAGNOSIS — O0990 Supervision of high risk pregnancy, unspecified, unspecified trimester: Secondary | ICD-10-CM | POA: Diagnosis present

## 2023-06-25 DIAGNOSIS — Z3A30 30 weeks gestation of pregnancy: Secondary | ICD-10-CM

## 2023-06-25 DIAGNOSIS — F319 Bipolar disorder, unspecified: Secondary | ICD-10-CM

## 2023-06-25 DIAGNOSIS — O10919 Unspecified pre-existing hypertension complicating pregnancy, unspecified trimester: Secondary | ICD-10-CM | POA: Diagnosis present

## 2023-06-25 DIAGNOSIS — O99210 Obesity complicating pregnancy, unspecified trimester: Secondary | ICD-10-CM | POA: Insufficient documentation

## 2023-06-25 DIAGNOSIS — O24419 Gestational diabetes mellitus in pregnancy, unspecified control: Secondary | ICD-10-CM | POA: Insufficient documentation

## 2023-06-25 DIAGNOSIS — O24410 Gestational diabetes mellitus in pregnancy, diet controlled: Secondary | ICD-10-CM

## 2023-06-25 DIAGNOSIS — O4443 Low lying placenta NOS or without hemorrhage, third trimester: Secondary | ICD-10-CM

## 2023-06-25 NOTE — Progress Notes (Signed)
 Patient information  Patient Name: Gina Hurley  Patient MRN:   [id number]  Referring practice: MFM Referring Provider: [facility])  MFM CONSULT  Gina Hurley is a 33 y.o. [phone] at [redacted]w[redacted]d here for ultrasound and consultation. Patient Active Problem List   Diagnosis Date Noted   Gestational diabetes mellitus, antepartum 05/28/2023   Low-lying placenta  (resolved) 04/08/2023   Chlamydia trachomatis infection in pregnancy in second trimester 03/12/2023   UTI in pregnancy, antepartum, second trimester 03/12/2023   High grade squamous intraepithelial lesion (HGSIL), grade 3 CIN, on biopsy of cervix 03/12/2023   Late latent syphilis 03/10/2023   Chronic hypertension affecting pregnancy 03/05/2023   BMI 39.0-39.9,adult 03/05/2023   Obesity in pregnancy 03/05/2023   Late prenatal care affecting pregnancy, antepartum 03/05/2023   Supervision of high risk pregnancy, antepartum 02/25/2023   Bipolar 1 disorder (HCC)     Gina Hurley is doing well today with no acute concerns.  The patient is now on Latus for GDM. Her blood sugars are now doing well on Lantus  9U. The estimated fetal weight also reflects good glycemic control.  Her blood pressure is 114/59 without antihypertensive medication.  Sonographic findings Single intrauterine pregnancy at 30w 5d.  Fetal cardiac activity:  Observed and appears normal. Presentation: Cephalic. Interval fetal anatomy appears normal. Fetal biometry shows the estimated fetal weight at the 72 percentile. Amniotic fluid volume: Within normal limits. MVP: 5.8 cm. Placenta: Anterior. There are limitations of prenatal ultrasound such as the inability to detect certain abnormalities due to poor visualization. Various factors such as fetal position, gestational age and maternal body habitus may increase the difficulty in visualizing the fetal anatomy.    Recommendations -Serial growth ultrasounds every 4-6 weeks until  delivery -Antenatal testing to start around 32 weeks  -Delivery around likely around 37 weeks pending course   - Continue to adjust insulin  to achieve at least 50 to 75% of glucose values at goal  Review of Systems: A review of systems was performed and was negative except per HPI   Vitals and Physical Exam    06/25/2023   11:01 AM 06/23/2023    2:52 PM 06/10/2023    2:08 PM  Vitals with BMI  Weight  243 lbs 11 oz 245 lbs 5 oz  Systolic 114 120 846  Diastolic 59 77 75  Pulse 86 99 96    Sitting comfortably on the sonogram table Nonlabored breathing Normal rate and rhythm Abdomen is nontender  Past pregnancies OB History  Gravida Para Term Preterm AB Living  4 3 3   3   SAB IAB Ectopic Multiple Live Births      3    # Outcome Date GA Lbr Len/2nd Weight Sex Type Anes PTL Lv  4 Current           3 Term 03/23/14 [redacted]w[redacted]d  9 lb 13 oz (4.451 kg) M Vag-Spont None  LIV     Birth Comments: wnl  2 Term 09/28/12 [redacted]w[redacted]d 12:18 / 00:15 8 lb 14.2 oz (4.03 kg) M Vag-Spont EPI  LIV  1 Term 06/03/10 [redacted]w[redacted]d 14:00 8 lb 4 oz (3.742 kg) F Vag-Spont EPI N LIV     Birth Comments: gall bladder issues     I spent 20 minutes reviewing the patients chart, including labs and images as well as counseling the patient about her medical conditions. Greater than 50% of the time was spent in direct face-to-face patient counseling.  [employer]  Gina Hurley  MFM, [place]   06/25/2023  12:17 PM

## 2023-06-26 ENCOUNTER — Other Ambulatory Visit

## 2023-07-02 ENCOUNTER — Ambulatory Visit: Admitting: Obstetrics and Gynecology

## 2023-07-02 ENCOUNTER — Ambulatory Visit: Attending: Maternal & Fetal Medicine | Admitting: *Deleted

## 2023-07-02 VITALS — BP 116/55 | HR 92

## 2023-07-02 DIAGNOSIS — Z3A31 31 weeks gestation of pregnancy: Secondary | ICD-10-CM

## 2023-07-02 DIAGNOSIS — O0930 Supervision of pregnancy with insufficient antenatal care, unspecified trimester: Secondary | ICD-10-CM

## 2023-07-02 DIAGNOSIS — O0990 Supervision of high risk pregnancy, unspecified, unspecified trimester: Secondary | ICD-10-CM

## 2023-07-02 DIAGNOSIS — O10013 Pre-existing essential hypertension complicating pregnancy, third trimester: Secondary | ICD-10-CM

## 2023-07-02 DIAGNOSIS — O0933 Supervision of pregnancy with insufficient antenatal care, third trimester: Secondary | ICD-10-CM

## 2023-07-02 DIAGNOSIS — O24415 Gestational diabetes mellitus in pregnancy, controlled by oral hypoglycemic drugs: Secondary | ICD-10-CM | POA: Insufficient documentation

## 2023-07-02 DIAGNOSIS — O99343 Other mental disorders complicating pregnancy, third trimester: Secondary | ICD-10-CM

## 2023-07-02 DIAGNOSIS — O24419 Gestational diabetes mellitus in pregnancy, unspecified control: Secondary | ICD-10-CM

## 2023-07-02 DIAGNOSIS — E669 Obesity, unspecified: Secondary | ICD-10-CM | POA: Insufficient documentation

## 2023-07-02 DIAGNOSIS — O99210 Obesity complicating pregnancy, unspecified trimester: Secondary | ICD-10-CM

## 2023-07-02 DIAGNOSIS — O10919 Unspecified pre-existing hypertension complicating pregnancy, unspecified trimester: Secondary | ICD-10-CM

## 2023-07-02 DIAGNOSIS — O99213 Obesity complicating pregnancy, third trimester: Secondary | ICD-10-CM | POA: Diagnosis not present

## 2023-07-02 DIAGNOSIS — F319 Bipolar disorder, unspecified: Secondary | ICD-10-CM | POA: Diagnosis not present

## 2023-07-02 NOTE — Procedures (Signed)
 Gina Hurley 11/26/1990 [redacted]w[redacted]d  Fetus A Non-Stress Test Interpretation for 07/02/23-NST only  Indication: Gestational Diabetes medication controlled and obese  Fetal Heart Rate A Mode: External Baseline Rate (A): 130 bpm Variability: Moderate Accelerations: 15 x 15 Decelerations: None Multiple birth?: No  Uterine Activity Mode: Toco Contraction Frequency (min): irreg Contraction Duration (sec): 50-80 Contraction Quality: Mild Resting Tone Palpated: Relaxed  Interpretation (Fetal Testing) Nonstress Test Interpretation: Reactive Comments: Tracing reviewed by Dr. Arnie Bibber

## 2023-07-04 ENCOUNTER — Ambulatory Visit: Admitting: Dietician

## 2023-07-07 NOTE — Progress Notes (Signed)
 After review, MFM consult with provider is not indicated for today  Cassandria Clever, MD 07/07/2023 6:18 PM  [facility]

## 2023-07-09 ENCOUNTER — Ambulatory Visit: Admitting: Obstetrics and Gynecology

## 2023-07-09 ENCOUNTER — Ambulatory Visit: Attending: Maternal & Fetal Medicine | Admitting: *Deleted

## 2023-07-09 VITALS — BP 128/67 | HR 84

## 2023-07-09 DIAGNOSIS — O24419 Gestational diabetes mellitus in pregnancy, unspecified control: Secondary | ICD-10-CM

## 2023-07-09 DIAGNOSIS — O99210 Obesity complicating pregnancy, unspecified trimester: Secondary | ICD-10-CM

## 2023-07-09 DIAGNOSIS — O24414 Gestational diabetes mellitus in pregnancy, insulin controlled: Secondary | ICD-10-CM

## 2023-07-09 DIAGNOSIS — O99213 Obesity complicating pregnancy, third trimester: Secondary | ICD-10-CM

## 2023-07-09 DIAGNOSIS — F319 Bipolar disorder, unspecified: Secondary | ICD-10-CM

## 2023-07-09 DIAGNOSIS — Z3A32 32 weeks gestation of pregnancy: Secondary | ICD-10-CM

## 2023-07-09 DIAGNOSIS — O0990 Supervision of high risk pregnancy, unspecified, unspecified trimester: Secondary | ICD-10-CM

## 2023-07-09 DIAGNOSIS — O10913 Unspecified pre-existing hypertension complicating pregnancy, third trimester: Secondary | ICD-10-CM | POA: Diagnosis not present

## 2023-07-09 DIAGNOSIS — E669 Obesity, unspecified: Secondary | ICD-10-CM

## 2023-07-09 DIAGNOSIS — O10013 Pre-existing essential hypertension complicating pregnancy, third trimester: Secondary | ICD-10-CM | POA: Diagnosis not present

## 2023-07-09 NOTE — Progress Notes (Signed)
 After review, MFM consult with provider is not indicated for today  Gina Clever, MD 07/09/2023 4:52 PM  [facility]

## 2023-07-09 NOTE — Procedures (Signed)
 BRELYN WOEHL [birth year] [redacted]w[redacted]d  Fetus A Non-Stress Test Interpretation for 07/09/23- NST   Indication: Chronic Hypertenstion, Gestational Diabetes medication controlled, and obese  Fetal Heart Rate A Mode: External Baseline Rate (A): 140 bpm Variability: Moderate Accelerations: 15 x 15 Decelerations: None Multiple birth?: No  Uterine Activity Mode: Toco Contraction Frequency (min): none Resting Tone Palpated: Relaxed  Interpretation (Fetal Testing) Nonstress Test Interpretation: Reactive Comments: Tracing reviewed byDr. Arnie Bibber

## 2023-07-11 ENCOUNTER — Other Ambulatory Visit: Payer: Self-pay

## 2023-07-11 ENCOUNTER — Ambulatory Visit: Admitting: Family Medicine

## 2023-07-11 VITALS — BP 134/77 | HR 89 | Wt 246.0 lb

## 2023-07-11 DIAGNOSIS — O10919 Unspecified pre-existing hypertension complicating pregnancy, unspecified trimester: Secondary | ICD-10-CM

## 2023-07-11 DIAGNOSIS — F319 Bipolar disorder, unspecified: Secondary | ICD-10-CM

## 2023-07-11 DIAGNOSIS — A528 Late syphilis, latent: Secondary | ICD-10-CM | POA: Diagnosis not present

## 2023-07-11 DIAGNOSIS — O24414 Gestational diabetes mellitus in pregnancy, insulin controlled: Secondary | ICD-10-CM | POA: Diagnosis not present

## 2023-07-11 DIAGNOSIS — O99210 Obesity complicating pregnancy, unspecified trimester: Secondary | ICD-10-CM

## 2023-07-11 DIAGNOSIS — O99213 Obesity complicating pregnancy, third trimester: Secondary | ICD-10-CM

## 2023-07-11 DIAGNOSIS — O0990 Supervision of high risk pregnancy, unspecified, unspecified trimester: Secondary | ICD-10-CM

## 2023-07-11 DIAGNOSIS — O98113 Syphilis complicating pregnancy, third trimester: Secondary | ICD-10-CM | POA: Diagnosis not present

## 2023-07-11 DIAGNOSIS — E669 Obesity, unspecified: Secondary | ICD-10-CM

## 2023-07-11 DIAGNOSIS — A568 Sexually transmitted chlamydial infection of other sites: Secondary | ICD-10-CM

## 2023-07-11 DIAGNOSIS — O24419 Gestational diabetes mellitus in pregnancy, unspecified control: Secondary | ICD-10-CM

## 2023-07-11 DIAGNOSIS — O2342 Unspecified infection of urinary tract in pregnancy, second trimester: Secondary | ICD-10-CM

## 2023-07-11 DIAGNOSIS — Z3A30 30 weeks gestation of pregnancy: Secondary | ICD-10-CM

## 2023-07-11 DIAGNOSIS — O99343 Other mental disorders complicating pregnancy, third trimester: Secondary | ICD-10-CM

## 2023-07-11 DIAGNOSIS — O0930 Supervision of pregnancy with insufficient antenatal care, unspecified trimester: Secondary | ICD-10-CM

## 2023-07-11 DIAGNOSIS — D069 Carcinoma in situ of cervix, unspecified: Secondary | ICD-10-CM

## 2023-07-11 DIAGNOSIS — O10013 Pre-existing essential hypertension complicating pregnancy, third trimester: Secondary | ICD-10-CM

## 2023-07-11 MED ORDER — INSULIN GLARGINE 100 UNIT/ML SOLOSTAR PEN
11.0000 [IU] | PEN_INJECTOR | Freq: Every day | SUBCUTANEOUS | 1 refills | Status: DC
Start: 2023-07-11 — End: 2023-07-22

## 2023-07-11 NOTE — Progress Notes (Signed)
   Subjective:  Gina Hurley is a 33 y.o. [phone] at [redacted]w[redacted]d being seen today for ongoing prenatal care.  She is currently monitored for the following issues for this high-risk pregnancy and has Supervision of high risk pregnancy, antepartum; Bipolar 1 disorder (HCC); Chronic hypertension affecting pregnancy; BMI 39.0-39.9,adult; Obesity in pregnancy; Late prenatal care affecting pregnancy, antepartum; Late latent syphilis; Chlamydia trachomatis infection in pregnancy in second trimester; UTI in pregnancy, antepartum, second trimester; High grade squamous intraepithelial lesion (HGSIL), grade 3 CIN, on biopsy of cervix; and Gestational diabetes mellitus, antepartum on their problem list.  Patient reports no complaints.  Contractions: Not present. Vag. Bleeding: None.  Movement: Present. Denies leaking of fluid.   The following portions of the patient's history were reviewed and updated as appropriate: allergies, current medications, past family history, past medical history, past social history, past surgical history and problem list. Problem list updated.  Objective:   Vitals:   07/11/23 0921  BP: 134/77  Pulse: 89  Weight: 246 lb (111.6 kg)    Fetal Status: Fetal Heart Rate (bpm): 139   Movement: Present     General:  Alert, oriented and cooperative. Patient is in no acute distress.  Skin: Skin is warm and dry. No rash noted.   Cardiovascular: Normal heart rate noted  Respiratory: Normal respiratory effort, no problems with respiration noted  Abdomen: Soft, gravid, appropriate for gestational age. Pain/Pressure: Absent     Pelvic: Vag. Bleeding: None     Cervical exam deferred        Extremities: Normal range of motion.  Edema: Trace  Mental Status: Normal mood and affect. Normal behavior. Normal judgment and thought content.   Urinalysis:        Assessment and Plan:  Pregnancy: G4P3003 at [redacted]w[redacted]d  1. Supervision of high risk pregnancy, antepartum (Primary) BP and FHR  normal  2. Late latent syphilis S/p treatment in 03/2023 Titer has decreased from 1:8>1:4, recheck on admission to L&D  3. High grade squamous intraepithelial lesion (HGSIL), grade 3 CIN, on biopsy of cervix Needs LEEP PP  4. Insulin  controlled gestational diabetes mellitus (GDM) during pregnancy, antepartum Currently on Lantus  9u daily Log reviewed, broadly just over goal with clear signs of dietary indiscretion Discussed with patient Increase Lantus  to 11u daily Last growth US  06/25/2023, EFW 72%, 1822g, AC 90%, AFI wnl Determine timing of delivery at next visit  5. Chronic hypertension affecting pregnancy Normotensive no meds On ASA Baseline labs unremarkable  6. Chlamydia trachomatis infection in pregnancy in second trimester TOC negative 05/02/2023  7. Bipolar 1 disorder (HCC) No meds, stable  8. Obesity in pregnancy   9. Late prenatal care affecting pregnancy, antepartum   10. UTI in pregnancy, antepartum, second trimester TOC normal 05/23/2023  Preterm labor symptoms and general obstetric precautions including but not limited to vaginal bleeding, contractions, leaking of fluid and fetal movement were reviewed in detail with the patient. Please refer to After Visit Summary for other counseling recommendations.  Return in 2 weeks (on 07/25/2023) for [facility], ob visit.   Teena Feast, MD

## 2023-07-11 NOTE — Progress Notes (Deleted)
 Patient was seen for Gestational Diabetes on *** Start time *** and End time ***  Estimated due date: 08/29/2023; ***w***d   Clinical: Medications:  Current Outpatient Medications:    Accu-Chek Softclix Lancets lancets, Use as instructed, Disp: 100 each, Rfl: 12   aspirin  EC 81 MG tablet, Take 1 tablet (81 mg total) by mouth daily., Disp: 60 tablet, Rfl: 1   Blood Glucose Monitoring Suppl (ACCU-CHEK GUIDE) w/Device KIT, 1 Device by Does not apply route 4 (four) times daily., Disp: 1 kit, Rfl: 0   glucose blood (ACCU-CHEK GUIDE TEST) test strip, 1 each by Other route 4 (four) times daily. Use as instructed, Disp: 100 each, Rfl: 12   insulin  glargine (LANTUS ) 100 UNIT/ML Solostar Pen, Inject 11 Units into the skin at bedtime., Disp: 6 mL, Rfl: 1   Insulin  Pen Needle 32G X 4 MM MISC, Please use as directed, Disp: 100 each, Rfl: 1   Prenatal Vit-Fe Fumarate-FA (PRENATAL VITAMINS PO), Take 1 tablet by mouth daily at 6 (six) AM., Disp: , Rfl:   Medical History:  Past Medical History:  Diagnosis Date   Bipolar 1 disorder (HCC)    Gallbladder attack    Mental disorder     Labs: OGTT fasting 98, 1 hour 140, 2 hour 114 on 05/23/2023 Lab Results  Component Value Date   HGBA1C 5.3 03/05/2023    Dietary and Lifestyle History:  *** 5/22: Pt presents today for insulin  instruction with her significant other, insulin  pen needles prescription and blood sugar log sheet. Pt reports she has finally received her [employer] benefits today. Pt reports she has been reviewing nutrition facts labels for total carbohydrates. Pt reports she picked up the pen needles yesterday stating her insulin  was out of stock and plans to contact them again today.  Pt reports switching from sugar added to her coffee to now selecting splenda. Pt reports she has decreased her juice intake successfully. Pt was provided insulin  instruction and was able to demonstrate her ability to accurately prime, measure and perform an mock insulin   injection using insulin  demonstration tools during today's encounter. RD spoke with pharmacy rep Dino Frank who states Lantus  will be ready to pick up at 6 pm today. RD instructed Pt to request transfer to another pharmacy if her insulin  remains out of stock and/or call the office. All Pt's questions were answered during today encounter.   Physical Activity: walking 5-7 days weekly for 60 minutes  Stress: 5 out of 10 / self care includes walking, napping Sleep: interrupted; average 7 hours nightly  24 hr Recall:  First Meal: 2 mini sausage biscuit, water (102 mg/dL; 2 hour post prandial per Pt)  Snack: none Second meal: fish, slaw, potato salad, water, (88 mg/dL; 2 hour post prandial per Pt)   water Snack: juice, peanut butter with crackers  Third meal: chicken, white rice, broccoli, dr pepper zero sugar (108 mg/dL; 2 hour post prandial per Pt)  Snack: none Beverages:  water, coffee with splenda and flavored creamer, dr pepper zero sugar, 1% milk   NUTRITION INTERVENTION  Nutrition education (E-1) on the following topics:   Initial Follow-up  [x]  []  Definition of Gestational Diabetes [x]  []  Why dietary management is important in controlling blood glucose [x]  []  Effects each nutrient has on blood glucose levels [x]  []  Simple carbohydrates vs complex carbohydrates [x]  [x]  Fluid intake [x]  [x]  Creating a balanced meal plan [x]  [x]  Carbohydrate counting  [x]  [x]  When to check blood glucose levels [x]  []  Proper blood  glucose monitoring techniques [x]  []  Effect of stress and stress reduction techniques  [x]  [x]  Exercise effect on blood glucose levels, appropriate exercise during pregnancy [x]  []  Importance of limiting caffeine and abstaining from alcohol and smoking [x]  [x]  Medications used for blood sugar control during pregnancy [x]  [x]  Hypoglycemia and rule of 15 [x]  []  Postpartum self care   Patient has a meter prior to visit. Patient is instructed to begin testing pre breakfast and 2  hours after each meal    Patient instructed to monitor glucose levels: QID FBS: 60 - <= 95 mg/dL; 2 hour: <= 161 mg/dL  Patient received handouts: How to Use an Insulin  Pen-learning about diabetes Hypoglycemia  Patient will be seen for follow-up on 07/17/2023

## 2023-07-11 NOTE — Patient Instructions (Addendum)
 [facility] Pediatric Providers  [place] ([id number]) [facility] Manson Passey, MD; Deirdre Priest, MD; Lum Babe, MD; Leveda Anna, MD; McDiarmid, MD; Jerene Bears, MD [address] [place] [place], [place] [address] [phone] Mon-Fri 8:30-12:30, 1:30-5:00  Providers come to see babies during newborn hospitalization Only accepting infants of Mother's who are seen at [facility] or have siblings seen at   [facility] Medicaid - Yes; Tricare - Yes   [facility] [place], MD [address] [place], [place] [address] [phone] Mon, Tue, Thur, Fri 8:30-5:00, Wed 10:00-7:00 (closed 1-2pm daily for lunch) [facility] residents with no insurance.  [place] [employer] only with Medicaid/insurance; Tricare - no  [facility]) - Tim and [facility], MD; Manson Passey, MD; Ave Filter, MD; Luna Fuse, MD; Kennedy Bucker, MD; Florestine Avers, MD; Melchor Amour, MD; Yetta Barre,  MD; Konrad Dolores, MD; Kathlene November, MD; Jenne Campus, MD; Wynetta Emery, MD; Duffy Rhody, MD; Gerre Couch, NP [address] [place], [place], [place] [address] [phone] Mon, Tue, Thur, Fri 8:30-5:30, Wed 9:30-5:30, Sat 8:30-12:30 Only accepting infants of first-time parents or siblings of current patients Hospital discharge coordinator will make follow-up appointment Medicaid - yes; Tricare - yes  [place] [phone]) [facility] Ilean China, MD; Earlene Plater, MD; Jamesetta Orleans, MD; Alvera Novel, MD; Rana Snare, MD; [facility], MD; Shaaron Adler, MD; Hosie Poisson, MD; Mayford Knife, MD [address], [place], [place] [address] [phone] Mon-Fri 8:30-5:00, closed for lunch 12:30-1:30; Sat-Sun 10:00-1:00 Accepting Newborns with commercial insurance only, must call prior to delivery to be accepted into  practice.  Medicaid - no, Tricare - yes   [facility] [address] [place], [place] [address] [phone] or [phone] Mon to Fri 8am to 10pm, Sat 8am to 1pm  (virtual only on weekends) Only accepts Medicaid Healthy Blue pts  [facility]) - Pediatrics at Elige Radon, MD; Sabino Dick, MD; Quitman Livings, MD; Betha Loa, NP; Claretha Cooper, MD; Lelon Perla, MD [address] [place] [place], [place] [address] [phone] Mon-Fri 8:30-5:30 Medicaid - yes, Tricare - yes  [place] [phone]) [facility] Marcie Mowers, MD [address], [place], [place] [address] [phone] Iona Hansen, Wed Fri 8:30-5:00, Sat 8:30-12:00, Closed Thursdays Accepting siblings of established patients and first time mom's if you call prenatally Medicaid- yes; Tricare - yes  [facility] Lutricia Feil, [place]; Tracie Harrier, MD; Rusty Aus; Scif[place], PA; Wynelle Link, MD; Azucena Cecil, MD;  [address] [place], [place] [address] [phone] Mon-Fri 8:30-5:00, closed for lunch 1-2 Only accepting newborns of established patients Medicaid- no; Tricare - yes  [facility] [phone]) [facility] Morene Crocker, MD; [address], [place], [place] [address] [phone] Mon-Fri 8:00-5:00 Medicaid - No; Tricare - Yes  [facility], [place]; [place], [place] [address] [place], [place] [address] [phone] Mon-Fri 8:00-5:00 Medicaid - No, Tricare - Yes  [facility] Cardell Peach, MD; Nash Dimmer, MD; [place], [place] [address] [place], [place] [address] [phone]  Mon-Fri 8:00-5:00 Medicaid - No; Tricare - Yes  [facility] [address], [place], [place] [address] [phone] Mon-Fri 8:30-5:00 (lunch 12:00-1:00) Medicaid -Yes; Tricare - Yes  [place] [facility] [place], MD [address] [place], [place], [place] [address] [phone] Mon-Fri 8:00-5:00 Seeing newborns of current patients only. No new patients Medicaid - No, Tricare - yes  Nature conservation officer at [place] [address] MD [address] [place], [place] [address] [phone] Mon-Fri 8:00-5:00 Medicaid -yes as secondary coverage only;  Tricare - yes  [facility] [place], [place]; [place], [place]; Avis Epley, MD; Vonna Kotyk, MD; Clance Boll, MD; [place], [place]; Smoot, NP; [place]an Basta, MD; [place], MD [address] [place], [place] [address] [phone] Mon-Fri 8:30-5:00, Sat 9:00-11:00 Accepts commercial insurance ONLY. Offers free prenatal information sessions for families. Medicaid - No, Tricare - Call first  [facility] [place], MD; [place], [place]; [place], [place]; [employer], [place] [address] [place] [address] [phone] Mon-Fri 7:30-5:30 Medicaid - Yes; Ailene Rud yes  [place] [phone] & [phone])  [facility], MD [address] [place], [place] [address] [phone] Mon-Thur 8:00-6:00, closed for lunch 12-2, closed Fridays Medicaid - yes; Tricare - no  [facility] Dareen Piano, NP; Cyndia Bent, MD; [place], [place]; [place], [place] [address], [place], [place] [address] [phone] Mon-Fri 7:30-4:30 Medicaid - yes, Tricare - yes  [facility]  Juanito Doom, MD; Janene Harvey, NP; Vonita Moss, MD; Donn Pierini, NP [address], [place], [place] [address] [phone] Mon-Fri 8:30-5:00, closed for lunch 1-2, Sat 8:30-12:00 - sick visits only Providers come to see babies at [facility] Only accepting newborns of siblings and first time parents ONLY if who have met with office prior to delivery Medicaid -Yes; Tricare - yes  [facility] - [place], [place]; Spero Geralds, NP; Earlene Plater, MD; Lucretia Roers, MD:  [address], [place], [place] [address] [phone] Mon- Fri 8:00-5:00, Sat 9:00-12:00 - sick visits only Accepting siblings of established patients and first time mom/baby Medicaid - Yes; Tricare - yes Patients must have vaccinations (baby vaccines)  [place] [phone] &  [phone])  Adult nurse HealthCare at [facility] [address] [place], [place] [address] [phone] Mon-Fri 8:00-5:00 Medicaid - no; Tricare - yes  [facility] [place], MD; [place], [place]; [place], [place] [address], [place], [place] [address] [phone] Mon-Fri 8:00-5:00 Medicaid- yes; Tricare - yes  [facility] - Ardeen Jourdain, MD; Yetta Barre, NP; [place], [place] [address] [place], [place], [place] [address] [phone] Mon-Fri 8:00-5:00 Medicaid - Yes; Tricare - yes  [address] [place] [phone])  [facility], [place]; [place], [place]; Eddie Candle, MD; Normand Sloop, MD; [place], NP; [place], DO; [place], [place]; Constance Goltz, MD; Ruthann Cancer, MD; Vear Clock, MD; [place], [place]; [place], [place]; [place], [place] [facility] [address], [place], [place] [address] [phone] Mon-Fri 8:30-5:00, Sat 9:00-12:00 - sick only Please register online [email or web address] then schedule online or call office Medicaid-Yes; Tricare -yes  [facility] - Premier  [place], MD; Romualdo Bolk, MD; [place], MD; [place], NP; [place], [place]; Antonietta Barcelona, MD; Mayford Knife, NP; Shelva Majestic, MD [address], [place], [place] [address] [phone] Mon-Fri 8:00-5:30, Sat&Sun by appointment (phones open at 8:30) Medicaid - Yes; Tricare - yes  [place] [phone] & [phone]) [facility] Mariel Aloe; [place], MD; Roger Shelter, MD; Arvilla Market, NP; [place], DO [address], [place], [place] [address] [phone] M-F 8:00 - 5:15, Sat/Sun 9-12 sick visits only Medicaid - No; Tricare - yes  [facility]  [place], PA-C; [place], PA-C; [place], DO; [place], PA-C; [place], PA-C; Roselyn Bering, MD [address] [place], [place] [address] [phone] Mon-Thur 8:00-7:00, Fri 8:00-5:00 Accepting Medicaid for 13 and under only   [facility] Family Medicine  at [place] [facility] [place] Reid Hope [place], [place]; List, FNP; Berneda Rose, MD; [place], PA-C; Scholer,  MD; Kellie Simmering, FNP; Genevie Cheshire, FNP; Evaristo Bury, MD; Berneda Rose, MD [phone] [address], [place], [place] [address] [phone] Mon-Fri 8:30-5:30 Medicaid - Yes; Tricare - yes  [facility] - [address]  [place], [place]; Whitney Post, MD; Hennie Duos, MD; Wynne Dust, MD; [place], NP [address], [place], [place] [address] [phone] Mon-Thur 8:00-5:30, Fri 8:00-5:00, Sat 9:00-12:00 Medicaid - yes, Tricare - yes  [place] [phone])  [facility], [place]; Lenise Arena, MD; [place], [place] [address], [place], [place] [address] [phone] Mon-Fri 8:00-5:00, closed for lunch 12-1 Medicaid - No; Tricare - yes  Nature conservation officer at [facility], MD [address] [place], [place] [address] [phone] Mon-Fri 8:00-5:00 Medicaid - No; Tricare - yes  [facility] - [facility], MD; Tami Ribas, MD; Mariam Dollar, MD; Yetta Barre, MD [address], [place], [place] [address] [phone] Mon-Fri 8:00-5:00 Medicaid- Yes; Tricare - yes  [place] [phone])  Adult nurse HealthCare at [facility], [place]; [place], MD [address] [place], [place], [place] [address] [phone] Mon-Fri 8:00-5:00 Medicaid - No; Tricare - yes  [facility] - Whitney Post - CPNP [address] [place] [address], [place], [place] [address] [phone] Mon-Weds 8:00-6:00, Thurs-Fri 8:00-5:00, Sat 9:00-12:00 Medicaid - yes; Tricare - yes   [facility] Katharina Caper, MD; [place], [place] [address] [place], [place] [address] [phone] Mon-Fri 8:00-5:00 Medicaid - yes; Tricare - yes  [facility] Pediatric Providers  [facility] [address], [place], [place] [address] [phone] Sheral Flow: 8am -8pm, Tues, Weds: 8am - 5pm; Fri: 8-1 Medicaid - Yes; Tricare -  yes  [facility] Rachel Bo, MD; Laural Benes, MD; Anner Crete, MD; [place], [place]; [place], [place] [address] [place], [place] [address] [phone] M-F 8:30 - 5:00 Medicaid - Call office; Tricare -yes  [facility] Edson Snowball, MD; Shanon Rosser, MD, Chelsea Primus, MD; Shirlyn Goltz, PNP; Wardell Heath, NP [phone] [address] [place], [place] [address] [phone] M-F 8:30 - 5:00, Sat/Sun 8:30 - 12:30 (sick visits) Medicaid - Call office; Tricare -yes  [facility] Melvyn Neth, MD; Karl Luke, PNP; Princess Bruins, MD; [place], [place]; [place], NP; Cynda Familia [address], [place], [place] [address] [phone] M-F 8:30 - 5:00 Medicaid - Call office; Tricare - yes  [facility] Jesusita Oka, MD; Dierdre Highman, MD; Earnest Conroy, MD; Timothy Lasso, MD; [place]ogo, MD [phone] [address], [place], [place] [address] [phone] M-Thur: 8:00 - 5:00; Fri: 8:00 - 4:00 Medicaid - yes; Tricare - yes  [facility] [address] [place], [place] [address] [phone] M-F: 8:30- 5:00, closed for lunch 12:30 - 1:00 Medicaid - yes; Tricare -yes  [facility] [address] [place], [place] [address] [phone] M-F 8:00 - 5:00 Medicaid - yes; Tricare - yes  [facility] [place], DO; [place], DO; Parker, NP [address], [place], [place] [address] [phone] M-F 8:00 - 5:00, Closed 12-1 for lunch Medicaid - Call; Tricare - yes  [facility] Meredith Mody, MD [address] [place], [place] [address] [phone] M-F: 8:00-5:00, Sat: 8:00 - noon Medicaid - call; Tricare -yes  [facility] Pediatric Providers  [facility] [place], [place] [address] [place], [place], [place] [address] [phone] M-W: 8:00-5:00, Thur: 8:00 - 7:00, Fri: 8:00 - noon Medicaid - yes; Tricare - yes  [facility] - Quay Burow, FNP [address], [place],  [place] [address] [phone] M-F 8:00 - 5:00, Closed for lunch 12-1 Medicaid -  yes; Tricare - yes  [facility] Pediatric Providers  [facility], [place], Alinda Money, MD, [place], FNP-C [address], [facility], [address], [place], [place] [address] [phone] M-T 8:00-5:00, Wed-Fri 7:00-6:00 Medicaid - Yes; Tricare -yes  [facility] at [facility], [place]; [address] Saner [place], [place] [address] [phone] M-F 8:00 - 5:00, closed for lunch 12-1 Medicaid - Yes; Tricare - yes  [facility] and Internal Medicine  Zachery Dauer, MD; Gladstone Lighter, MD; Collie Siad, MD; Freda Jackson, MD; Rich Number, MD; Darryl Nestle, MD; Melinda Crutch, MD, Audria Nine, MD; Tawanna Cooler, MD; Steffanie Dunn, MD; Byrd Hesselbach, MD; Lucretia Roers, MD [address], [place], [place] [address] [phone] M-F 8:00-5:00 Medicaid - yes; Tricare - yes  [facility] [place], MD (speaks [place] and Hindi) [address] [place], [place] [address] [phone] M-F: 8:30 - 5:00, closed 12:30 - 1 for lunch Medicaid - Yes; Tricare -yes  [facility] Pediatric Providers  [facility] Shanda Bumps, MD; Chanetta Marshall, MD; Laurell Josephs, MD [address], [place], [place] [address] [phone] M-Th: 8:00 - 5:30, Fri: 8:00 - 12:00 Medicaid - yes; Tricare - yes  [facility] Pediatrics at [facility], NP; Thora Lance, MD; Orrin Brigham, MD [phone] [address] [place], [place] [address] [phone] M-F: 8:00 - 5:00 Medicaid - yes; Tricare - yes  [facility] [place], NP; Orson Slick, NP; Salley Scarlet, NP; Linton Flemings, MD; Mayford Knife, MD, [place], NP, Emelda Fear, MD; Nida Boatman [address], [place], [place] [address] [phone] M-F: 8:30 - 5:30p Medicaid - yes; Tricare - yes Other locations available as well  [facility], MD; Andrey Campanile, MD; Neville Route, PA-C [address] [place], [place] [address] [phone] M-W: 8:00am - 7:00pm, Thurs: 8:00am - 8:00pm; Fri: 8:00am -  5:00pm, closed daily from 12-1 for lunch Medicaid - yes; Tricare - yes  [facility] Pediatric Providers  [facility] Pediatrics at Levin Erp, MD; Aggie Cosier, FNP; Bland Span, MD; Tristan Schroeder, MD; Byron, PNP; Alesia Banda; [place], [place]; Julian Reil, MD;  [address] [place], [place] [address] [phone] Judie Petit - Caleen Essex: 8am - 5pm, Sat 9-noon Medicaid - Yes; Tricare -yes  [facility] at Jaclynn Guarneri, MD; Yetta Barre, FNP; Lilian Kapur, MD; Mariam Dollar, MD [address] [place], [place] [phone] M-F 8:00 - 5:00 Medicaid - call; Tricare - yes  [facility]- Cruz Condon, MD; [place], [place]; Delora Fuel, MD; Dareen Piano, MD; Trudee Grip, MD; Kizzie Ide, MD; Zebedee Iba; Birdena Crandall, MD; Hinton Dyer, MD; [place], MD [address] [place], [place] [address] [phone] M-F 8:00am - 5:00pm; Sat. 9:00 - 11:00 Medicaid - yes; Tricare - yes  [facility] at [facility], MD [address] [place], [place] [address] [phone] M-F 8:00 - 5:00 Medicaid - [place] Medicaid only; Tricare - yes  [facility] - Illene Bolus, MD; Earlene Plater, [place]; Kenyon Ana, MD [address] [place], [place] [address] [phone] M-F 8:00 - 5:00 Medicaid - yes; Tricare - yes  [facility] [address] Pediatrics - Lind Covert, MD; Manson Passey, MD, [facility], MD, Luther, MD; Barnard, MD; Katrinka Blazing, MD; [address] [place], [place] [address] [phone] M-F: 8-5 Medicaid - yes; Tricare - yes  [facility] - Henrietta Hoover, ; Ong, MD; [address], [place], [place] [address] [phone] M-F 8-5 Medicaid - yes; Tricare - yes  [address] [place] Darrol Poke, MD; Tami Ribas, MD;  [place], MD; [place], DNP; [place], PNP [address], [place], [place] [address] [phone] M-F 8-5 Medicaid - yes; Tricare - yes  [facility] and Pediatrics Delories Heinz, MD;  Adrienne Mocha; Ala Bent, MD [address], [place], [place] [address] [phone] M-F 7am - 5 pm Medicaid - call; Tricare - yes  [facility] [place], [place]; Fredia Beets, MD; Roxan Hockey, MD [address] [place], [place] [address] [phone] M-F 8-5 Medicaid - yes; Tricare - yes  [facility] Kae Heller, MD; Sheliah Hatch, MD; Mayford Knife, FNP; Shon Baton, FNP; Tyron Russell, FNP; Ishmael Holter; [facility] - FNP [address], [place], [place] [address] [phone] M-F 8-5 Medicaid- yes; Tricare - yes  [facility] - Betsy Coder, [place] and Chalmers Guest, MD; Terrial Rhodes, MD; Hulda Humphrey, MD; Roseanne Reno, MD; [place], [place]; Ala Dach, MD; Fredia Beets, MD; Dimple Casey, MD [address] [place], [place] [address] [phone] M-F: 8-5, Sat: 9-4, Sun 9-12 Medicaid - yes; Tricare - yes  [facility] Little, PNP; Earlene Plater, PNP [address] [place], [place] [address] [phone] M-F 8 - 5, closed 12-1 for lunch Medicaid - yes; Tricare - yes  [facility] Kathyrn Lass, MD; Hal Neer, MD; Dimple Casey, MD; [place], DO [address] [place], [place] [address] [phone] M-F 8- 5:30 Medicaid - yes; Tricare - yes  [facility] Pediatrics - Biagio Quint, MD; Rosalia Hammers, MD; Gwenith Daily, MD [address] [place], [place] [address] [phone] Judie Petit: Nicholas Lose; Tues-Fri: 8-5; Sat: 9-12 Medicaid - yes; Tricare - yes  [facility] - Bobbye Morton, MD; Daphane Shepherd, MD; Chestine Spore, MD; Haskell Riling, MD; Kate Sable, MD [address], [place], [place] [address] [phone] Judie PetitMarland Kitchen Nicholas LoseFrancee Nodal: 8-5; Sat: 8:30-12:30 Medicaid - yes; Tricare - yes  [facility] Heck[facility] Pediatrics - Beckey Rutter, MD; [place], [place] [address] [place], [place] [address] [phone] Mon-Fri: 8-5 Medicaid - yes;  Tricare - yes  [facility] Pediatrics - [place], CPNP; [place], [place]; Dimple Casey, MD; Alisa Graff, MD; Cephus Shelling, MD; [address], [place], [place] [address] [phone] M-F: 8-5, closed 1-2 for lunch Medicaid - yes; Tricare - yes  [facility]arnelle [facility] - [facility] [place], [place]; [place], [place]; Katrinka Blazing, MD; [place], CPNP; [place], [place]; [place], MD; Earlene Plater, MD [address], [place], [place] [address] [phone] M-Thurs: Nicholas Lose; Fri: 8-6; Sat: 9-12; Sun 2-4 Medicaid - yes; Tricare - yes  [facility] Georgeanna Lea, MD; Evette Cristal, MD; Shea Stakes, FNP; Earney Mallet, DO; [address], [place], [place] [address] [phone] M-F: 8-5 Medicaid - yes; Tricare - yes  [facility] Pediatric Providers  [facility] Family Medicine -Collene Mares, MD; [place], NP [address], [place], [place] [address] [phone] M - Fri: 8am - 5pm, closed for lunch 12-1 Medicaid - Yes; Tricare - yes  [facility] Elinor Parkinson, MD; Victory Dakin, MD; [place], DO; Vinocur, MD;Hall, PA; Clent Ridges, [place]; Orvan Falconer, NP [phone] [address] [place], [place] [address] [phone] M-F 8:00 - 5:00, Sat 8:00 - 11:30 Medicaid - yes; Tricare - yes  [facility] Welton Flakes, MD; [place], MD, [address] MD, [place], MD, [place], MD; [place], NP; [place], [place];  [address], [place], [place] [address] [phone] M-F 8:10am - 5:00pm Medicaid - yes; Tricare - yes  [facility] [place], MD; [place], NP [address] [place], [place] [address] [phone] M-F 8:00 - 5:00 Medicaid - [place] Medicaid only; Tricare - yes  [facility] Family Medicine - Deep [address] [place], [place]; [place], NP [address], [place], [place] [address] [phone] M-F 8:00 - 5:00; Closed for lunch 12 - 1:00 Medicaid - yes;  Tricare - yes  [facility] Belva Crome, MD; Jonita Albee, FNP [address] [place], [place] [address] [phone] Mon 9-5; Tues/Wed 10-5; Thurs 8:30-5; Fri: 8-12:30 Medicaid - yes; Tricare - yes  [facility] Pediatric Providers  [facility]  [place], MD; [place], [place] [address] [place], [place] [address] [phone] phone [phone] fax M-F 7:15 - 4:30 Medicaid - yes; Tricare - yes  [facility] Karilyn Cota, MD; [place], DO [address], [place], [place] [address] [phone] M-Fri: 8:30 - 5:00, closed for lunch everyday noon - 1pm Medicaid - Yes; Tricare - yes  [facility] Burdine, MD; Reuel Boom, MD; Dimas Aguas, MD; Neita Carp, MD; [place], [place]; Bonn[place] Nasuti, [place]; [place], [place]; [place], [place]; [place], [place] [address] [place], [place] [address] [phone] M-Thurs: 7:30am - 7:00pm; Friday 7:30am - 4pm; Sat: 8:00 - 1:00 Medicaid - Yes; Tricare - yes  [facility] Norval Morton, MD; Conni Elliot, MD; Carroll Kinds, MD; Stuart, DO [address], [place], [place] [address] [phone] M-Thur: 8:00 - 5:00, Fri: 8:00 - Noon Medicaid - yes; Tricare - yes No [place] [employer]  [place] [facility] Dettinger, MD; Nadine Counts, DO; [place], NP; Daphine Deutscher, NP; Lequita Halt, NP; Ellamae Sia, NP; Reginia Forts, NP; Darlyn Read, MD; [place], [place] [address], [place], [place] [address] [phone] M-F 8:00 - 5:00 Medicaid - yes; Tricare - yes  [facility], FNP-C; [place], FNP-C [address] [place], [place] [address] [phone] M, W, R 8:00-5:00, Tues: 8:00am - 7:00pm; Fri 8:00 - noon Medicaid - Yes; Tricare - yes  [facility], MD [address] [place], [place] [address] [phone]  M-Thurs 8:30-5:30, Fri: 8:30-12:30pm Medicaid - Yes; Tricare - N   Birth Control Options Birth control is also called contraception. Birth control  prevents pregnancy. There are many types of birth control. Work with your health care provider to find the best option for you. Birth control that uses hormones These types of birth control have hormones in them to prevent pregnancy. Birth control implant This is a small tube that is put into the skin of your arm. The tube can stay in for up to 3 years. Birth control shot These are shots you get every 3 months. Birth control pills This is a pill you take every day. You need to take it at the same time each day. Birth control patch This is a patch that you put on your skin. You change it 1 time each week for 3 weeks. After that, you take the patch off for 1 week. Vaginal ring  This is a soft plastic ring that you put in your vagina. The ring is left in for 3 weeks. Then, you take it out for 1 week. Then, you put a new ring in. Barrier methods  Female condom This is a thin covering that you put on the penis before sex. The condom is thrown away after sex. Female condom This is a soft, loose covering that you put in the vagina before sex.  The condom is thrown away after sex. Diaphragm A diaphragm is a soft barrier that is shaped like a bowl. It must be made to fit your body. You put it in the vagina before sex with a chemical that kills sperm called spermicide. A diaphragm should be left in the vagina for 6-8 hours after sex and taken out within 24 hours. You need to replace a diaphragm: Every 1-2 years. After giving birth. After gaining more than 15 lb (6.8 kg). If you have surgery on your pelvis. Cervical cap This is a small, soft cup that fits over the cervix. The cervix is the lowest part of the uterus. It's put in the vagina before sex, along with spermicide. The cap must be made for you. The cap should be left in for 6-8 hours after sex. It is taken out within 48 hours. A cervical cap must be prescribed and fit to your body by a provider. It should be replaced every 2  years. Sponge This is a small sponge that is put into the vagina before sex. It must be left in for at least 6 hours after sex. It must be taken out within 30 hours and thrown away. Spermicides These are chemicals that kill or stop sperm from getting into the uterus. They may be a pill, cream, jelly, or foam that you put into your vagina. They should be used at least 10-15 minutes before sex. Intrauterine device An intrauterine device (IUD) is a device that's put in the uterus by a provider. There are two types: Hormone IUD. This kind can stay in for 3-5 years. Copper IUD. This kind can stay in for 10 years. Permanent birth control Female tubal ligation This is surgery to block the fallopian tubes. Female sterilization This is a surgery, called a vasectomy, to tie off the tubes that carry sperm in men. This method takes 3 months to work. Other forms of birth control must be used for 3 months. Natural planning methods This means not having sex on the days the female partner could get pregnant. Here are some types of natural planning birth control: Using a calendar: To keep track of the length of each menstrual cycle. To find out what days pregnancy can happen. To plan to not have sex on days when pregnancy can happen. Watching for signs of ovulation and not having sex during this time. The female partner can check for ovulation by keeping track of their temperature each day. They can also look for changes in the mucus that comes from the cervix. Where to find more information Centers for Disease Control and Prevention: TonerPromos.no. Then: Enter "birth control" in the search box. This information is not intended to replace advice given to you by your health care provider. Make sure you discuss any questions you have with your health care provider. Document Revised: 10/10/2022 Document Reviewed: 03/05/2022 Elsevier Patient Education  2024 [employer].

## 2023-07-16 ENCOUNTER — Ambulatory Visit: Attending: Maternal & Fetal Medicine | Admitting: *Deleted

## 2023-07-16 ENCOUNTER — Ambulatory Visit: Admitting: Obstetrics

## 2023-07-16 VITALS — BP 120/59 | HR 90

## 2023-07-16 DIAGNOSIS — O99213 Obesity complicating pregnancy, third trimester: Secondary | ICD-10-CM

## 2023-07-16 DIAGNOSIS — O0990 Supervision of high risk pregnancy, unspecified, unspecified trimester: Secondary | ICD-10-CM

## 2023-07-16 DIAGNOSIS — O10913 Unspecified pre-existing hypertension complicating pregnancy, third trimester: Secondary | ICD-10-CM

## 2023-07-16 DIAGNOSIS — Z3A33 33 weeks gestation of pregnancy: Secondary | ICD-10-CM | POA: Diagnosis not present

## 2023-07-16 DIAGNOSIS — F319 Bipolar disorder, unspecified: Secondary | ICD-10-CM

## 2023-07-16 DIAGNOSIS — O99210 Obesity complicating pregnancy, unspecified trimester: Secondary | ICD-10-CM

## 2023-07-16 DIAGNOSIS — O10013 Pre-existing essential hypertension complicating pregnancy, third trimester: Secondary | ICD-10-CM

## 2023-07-16 DIAGNOSIS — E669 Obesity, unspecified: Secondary | ICD-10-CM

## 2023-07-16 DIAGNOSIS — O24414 Gestational diabetes mellitus in pregnancy, insulin controlled: Secondary | ICD-10-CM | POA: Diagnosis not present

## 2023-07-16 DIAGNOSIS — O10919 Unspecified pre-existing hypertension complicating pregnancy, unspecified trimester: Secondary | ICD-10-CM

## 2023-07-16 DIAGNOSIS — O99343 Other mental disorders complicating pregnancy, third trimester: Secondary | ICD-10-CM

## 2023-07-16 NOTE — Progress Notes (Signed)
 The patient had a reactive NST today.

## 2023-07-16 NOTE — Addendum Note (Signed)
 Addended by: ILEANA BABARA RUSHIE STEFFAN on: 07/16/2023 10:43 AM   Modules accepted: Level of Service

## 2023-07-16 NOTE — Procedures (Signed)
 Gina Hurley [birth year] [redacted]w[redacted]d  Fetus A Non-Stress Test Interpretation for 07/16/23-NST only  Indication: Gestational Diabetes medication controlled and obese, CHTN  Fetal Heart Rate A Mode: External Baseline Rate (A): 145 bpm Variability: Moderate Accelerations: 15 x 15 Decelerations: None Multiple birth?: No  Uterine Activity Mode: Toco Contraction Frequency (min): none Resting Tone Palpated: Relaxed  Interpretation (Fetal Testing) Nonstress Test Interpretation: Reactive Comments: Tracing reviewed byDr. Ileana

## 2023-07-16 NOTE — Procedures (Signed)
 Gina Hurley [birth year] [redacted]w[redacted]d  Fetus A Non-Stress Test Interpretation for 07/16/23  Indication: Chronic Hypertenstion and Gestational Diabetes medication controlled  Fetal Heart Rate A Mode: External Baseline Rate (A): 145 bpm Variability: Moderate Accelerations: 15 x 15 Decelerations: None Multiple birth?: No  Uterine Activity Mode: Toco Contraction Frequency (min): none Resting Tone Palpated: Relaxed  Interpretation (Fetal Testing) Nonstress Test Interpretation: Reactive Comments: Tracing reviewed byDr. Ileana

## 2023-07-16 NOTE — Progress Notes (Signed)
 Her NST was reactive today.

## 2023-07-17 ENCOUNTER — Other Ambulatory Visit

## 2023-07-17 DIAGNOSIS — O24419 Gestational diabetes mellitus in pregnancy, unspecified control: Secondary | ICD-10-CM

## 2023-07-21 ENCOUNTER — Other Ambulatory Visit: Payer: Self-pay

## 2023-07-21 ENCOUNTER — Encounter (HOSPITAL_COMMUNITY): Payer: Self-pay | Admitting: Obstetrics and Gynecology

## 2023-07-21 ENCOUNTER — Inpatient Hospital Stay (HOSPITAL_COMMUNITY)
Admission: AD | Admit: 2023-07-21 | Discharge: 2023-07-21 | Disposition: A | Discharge status: Home / Self Care | Attending: Obstetrics and Gynecology | Admitting: Obstetrics and Gynecology

## 2023-07-21 ENCOUNTER — Other Ambulatory Visit: Payer: Self-pay | Admitting: Obstetrics and Gynecology

## 2023-07-21 DIAGNOSIS — O26893 Other specified pregnancy related conditions, third trimester: Secondary | ICD-10-CM | POA: Diagnosis not present

## 2023-07-21 DIAGNOSIS — Z3A34 34 weeks gestation of pregnancy: Secondary | ICD-10-CM | POA: Diagnosis not present

## 2023-07-21 DIAGNOSIS — M545 Low back pain, unspecified: Secondary | ICD-10-CM | POA: Diagnosis present

## 2023-07-21 DIAGNOSIS — O10013 Pre-existing essential hypertension complicating pregnancy, third trimester: Secondary | ICD-10-CM | POA: Diagnosis not present

## 2023-07-21 DIAGNOSIS — R102 Pelvic and perineal pain: Secondary | ICD-10-CM | POA: Diagnosis present

## 2023-07-21 DIAGNOSIS — A530 Latent syphilis, unspecified as early or late: Secondary | ICD-10-CM | POA: Diagnosis not present

## 2023-07-21 DIAGNOSIS — M549 Dorsalgia, unspecified: Secondary | ICD-10-CM

## 2023-07-21 DIAGNOSIS — O99891 Other specified diseases and conditions complicating pregnancy: Secondary | ICD-10-CM | POA: Diagnosis not present

## 2023-07-21 DIAGNOSIS — O99213 Obesity complicating pregnancy, third trimester: Secondary | ICD-10-CM | POA: Insufficient documentation

## 2023-07-21 DIAGNOSIS — O24113 Pre-existing diabetes mellitus, type 2, in pregnancy, third trimester: Secondary | ICD-10-CM | POA: Insufficient documentation

## 2023-07-21 DIAGNOSIS — O98113 Syphilis complicating pregnancy, third trimester: Secondary | ICD-10-CM | POA: Insufficient documentation

## 2023-07-21 DIAGNOSIS — Z202 Contact with and (suspected) exposure to infections with a predominantly sexual mode of transmission: Secondary | ICD-10-CM | POA: Diagnosis not present

## 2023-07-21 DIAGNOSIS — Z113 Encounter for screening for infections with a predominantly sexual mode of transmission: Secondary | ICD-10-CM | POA: Diagnosis present

## 2023-07-21 LAB — URINALYSIS, ROUTINE W REFLEX MICROSCOPIC
Bilirubin Urine: NEGATIVE
Glucose, UA: 50 mg/dL — AB
Hgb urine dipstick: NEGATIVE
Ketones, ur: NEGATIVE mg/dL
Nitrite: NEGATIVE
Protein, ur: 30 mg/dL — AB
Specific Gravity, Urine: 1.028 (ref 1.005–1.030)
pH: 5 (ref 5.0–8.0)

## 2023-07-21 LAB — WET PREP, GENITAL
Clue Cells Wet Prep HPF POC: NONE SEEN
Trich, Wet Prep: NONE SEEN
WBC, Wet Prep HPF POC: >=10 — AB (ref <10)
Yeast Wet Prep HPF POC: NONE SEEN

## 2023-07-21 MED ORDER — ACETAMINOPHEN 500 MG PO TABS
1000.0000 mg | ORAL_TABLET | Freq: Once | ORAL | Status: AC
Start: 2023-07-21 — End: 2023-07-21
  Administered 2023-07-21: 1000 mg via ORAL
  Filled 2023-07-21: qty 2

## 2023-07-21 MED ORDER — CYCLOBENZAPRINE HCL 10 MG PO TABS: 10.0000 mg | ORAL_TABLET | Freq: Two times a day (BID) | ORAL | 0 refills | Status: DC | PRN

## 2023-07-21 MED ORDER — CYCLOBENZAPRINE HCL 5 MG PO TABS
10.0000 mg | ORAL_TABLET | Freq: Once | ORAL | Status: AC
  Administered 2023-07-21: 10 mg via ORAL
  Filled 2023-07-21: qty 2

## 2023-07-21 NOTE — MAU Note (Signed)
 Gina Hurley is a 33 y.o. at [redacted]w[redacted]d here in MAU reporting: lower back pain and vaginal pressure. Also noted burning on urination this morning and has had some nausea but no vomiting. Denies VB, LOF or regular Ucs. Reports occasional belly tightening but nothing that is very painful. States baby hasn't been moving as much as normal.   Pain score: 7/10 in back Vitals:   07/21/23 0908  BP: 125/79  Pulse: 95  Resp: 18  Temp: 98.1 F (36.7 C)  SpO2: 99%     FHT: 140  Lab orders placed from triage: UA

## 2023-07-21 NOTE — MAU Provider Note (Signed)
 Chief Complaint:  Back Pain and Pelvic Pain   HPI   Gina Hurley is a 33 y.o. [phone] at [redacted]w[redacted]d who presents to maternity admissions reporting c/o vaginal itching and pressure with some lower back pain. She denies any VB, LOF, and reports fetal movements are present.  Her pregnancy is complicated by cHTN, Obesity, GDM A2, HGSIL, Latent syphilis, and chlamydia infection in pregnancy   P[facility]: [facility]   Past Medical History:  Diagnosis Date   Bipolar 1 disorder (HCC)    Gallbladder attack    Mental disorder    OB History  Gravida Para Term Preterm AB Living  4 3 3   3   SAB IAB Ectopic Multiple Live Births      3    # Outcome Date GA Lbr Len/2nd Weight Sex Type Anes PTL Lv  4 Current           3 Term 03/23/14 [redacted]w[redacted]d  4451 g M Vag-Spont None  LIV     Birth Comments: wnl  2 Term 09/28/12 [redacted]w[redacted]d 12:18 / 00:15 4030 g M Vag-Spont EPI  LIV  1 Term 06/03/10 [redacted]w[redacted]d 14:00 3742 g F Vag-Spont EPI N LIV     Birth Comments: gall bladder issues   Past Surgical History:  Procedure Laterality Date   ADENOIDECTOMY     as a child   CHOLECYSTECTOMY N/A 02/16/2021   Procedure: LAPAROSCOPIC CHOLECYSTECTOMY;  Surgeon: Evonnie Dorothyann LABOR, DO;  Location: [facility];  Service: General;  Laterality: N/A;   ERCP     ERCP with sphincterotomy, sludge in CBD s/p removal, all apparent sludge/stones removed.   Tubes in Ears     Family History  Problem Relation Age of Onset   Diabetes Mother    Heart disease Father    Diabetes Father    Hypertension Father    Diabetes Maternal Aunt    Hypertension Maternal Aunt    Hypertension Maternal Uncle    Hypertension Maternal Grandmother    Asthma Paternal Grandmother    Social History   Tobacco Use   Smoking status: Former    Types: Cigarettes   Smokeless tobacco: Never  Vaping Use   Vaping status: Former   Quit date: 09/12/2022  Substance Use Topics   Alcohol use: Not Currently   Drug use: No   Allergies  Allergen Reactions    Shellfish Allergy Anaphylaxis and Swelling   Iodine Rash   No medications prior to admission.    I have reviewed patient's Past Medical Hx, Surgical Hx, Family Hx, Social Hx, medications and allergies.   ROS  Pertinent items noted in HPI and remainder of comprehensive ROS otherwise negative.   PHYSICAL EXAM  Patient Vitals for the past 24 hrs:  BP Temp Temp src Pulse Resp SpO2 Height Weight  07/21/23 1120 131/73 -- -- 80 -- -- -- --  07/21/23 0915 -- -- -- -- -- -- 5' 5 (1.651 m) 110.1 kg  07/21/23 0908 125/79 98.1 F (36.7 C) Oral 95 18 99 % -- --    Constitutional: Well-developed, obese female in no acute distress.  Cardiovascular: normal rate & rhythm, warm and well-perfused Respiratory: normal effort, no problems with respiration noted GI: Abd soft, non-tender, gravid MS: Extremities nontender, no edema, normal ROM Neurologic: Alert and oriented x 4.  GU: no CVA tenderness Pelvic: exam chaperoned by Nat Mule RN  SSE: No pooling, scant vaginal discharge appears to be yellowish, no blood visualized and cervix is visibly closed. SVE: L/Closed  Fetal Tracing: Cat 1 Baseline:125-130 Variability: moderate  Accelerations: present Decelerations: absent Toco: no ctx   Labs: Results for orders placed or performed during the hospital encounter of 07/21/23 (from the past 24 hours)  Urinalysis, Routine w reflex microscopic -Urine, Clean Catch     Status: Abnormal   Collection Time: 07/21/23  8:48 AM  Result Value Ref Range   Color, Urine YELLOW YELLOW   APPearance CLOUDY (A) CLEAR   Specific Gravity, Urine 1.028 1.005 - 1.030   pH 5.0 5.0 - 8.0   Glucose, UA 50 (A) NEGATIVE mg/dL   Hgb urine dipstick NEGATIVE NEGATIVE   Bilirubin Urine NEGATIVE NEGATIVE   Ketones, ur NEGATIVE NEGATIVE mg/dL   Protein, ur 30 (A) NEGATIVE mg/dL   Nitrite NEGATIVE NEGATIVE   Leukocytes,Ua TRACE (A) NEGATIVE   RBC / HPF 6-10 0 - 5 RBC/hpf   WBC, UA 21-50 0 - 5 WBC/hpf    Bacteria, UA RARE (A) NONE SEEN   Squamous Epithelial / HPF 0-5 0 - 5 /HPF   Mucus PRESENT    Ca Oxalate Crys, UA PRESENT   Wet prep, genital     Status: Abnormal   Collection Time: 07/21/23  9:36 AM   Specimen: PATH Cytology Cervicovaginal Ancillary Only  Result Value Ref Range   Yeast Wet Prep HPF POC NONE SEEN NONE SEEN   Trich, Wet Prep NONE SEEN NONE SEEN   Clue Cells Wet Prep HPF POC NONE SEEN NONE SEEN   WBC, Wet Prep HPF POC >=10 (A) <10   Sperm PRESENT     Imaging:  No results found.  MDM & MAU COURSE  MDM:  HIGH  Prenatal records reviewed Exam with Pelvic Vaginal swabs  U/A Tylenol /Flexeril  for pain NST for GA and fetal well being  MAU Course: Orders Placed This Encounter  Procedures   Wet prep, genital   Urinalysis, Routine w reflex microscopic -Urine, Clean Catch   Discharge patient Discharge disposition: 01-Home or Self Care; Discharge patient date: 07/21/2023   Meds ordered this encounter  Medications   acetaminophen  (TYLENOL ) tablet 1,000 mg   cyclobenzaprine  (FLEXERIL ) tablet 10 mg   cyclobenzaprine  (FLEXERIL ) 10 MG tablet    Sig: Take 1 tablet (10 mg total) by mouth 2 (two) times daily as needed for muscle spasms.    Dispense:  20 tablet    Refill:  0    Supervising Provider:   PRATT, TANYA S [[id number]]    I have reviewed the patient chart and performed the physical exam . I have ordered & interpreted the lab results and reviewed and interpreted the NST Medications ordered as stated below.  A/P as described below.  Counseling and education provided and patient agreeable  with plan as described below. Verbalized understanding.    ASSESSMENT   1. Back pain affecting pregnancy in third trimester   2. Pelvic pain affecting pregnancy in third trimester, antepartum   3. [redacted] weeks gestation of pregnancy     PLAN  Discharge home in stable condition with return precautions.   Future Appointments  Date Time Provider Department Center  07/22/2023 10:55 AM  Eveline Lynwood MATSU, MD [facility]  07/23/2023 11:00 AM [facility] PROVIDER 1 [facility]  07/23/2023 11:30 AM [facility] US2 [facility]  08/04/2023 10:55 AM Cleatus Moccasin, MD [facility]  08/11/2023 10:55 AM Ilean Norleen GAILS, MD [facility]  08/20/2023 10:55 AM Fredirick Glenys RAMAN, MD [facility]     See AVS for full description of information given to  the patient including both verbal and written. Patient verbalized understanding and agrees with the plan as described above.     Follow-up Information     Center for [facility] Follow up.   Specialty: Obstetrics and Gynecology Why: If symptoms worsen or fail to resolve, As scheduled for ongoing prenatal care Contact information: [address] [place]  [address] [phone]                Allergies as of 07/21/2023       Reactions   Shellfish Allergy Anaphylaxis, Swelling   Iodine Rash        Medication List     TAKE these medications    Accu-Chek Guide Test test strip Generic drug: glucose blood 1 each by Other route 4 (four) times daily. Use as instructed   Accu-Chek Guide w/Device Kit 1 Device by Does not apply route 4 (four) times daily.   Accu-Chek Softclix Lancets lancets Use as instructed   aspirin  EC 81 MG tablet Take 1 tablet (81 mg total) by mouth daily.   cyclobenzaprine  10 MG tablet Commonly known as: FLEXERIL  Take 1 tablet (10 mg total) by mouth 2 (two) times daily as needed for muscle spasms.   insulin  glargine 100 UNIT/ML Solostar Pen Commonly known as: LANTUS  Inject 11 Units into the skin at bedtime.   Insulin  Pen Needle 32G X 4 MM Misc Please use as directed   PRENATAL VITAMINS PO Take 1 tablet by mouth daily at 6 (six) AM.        Olam Dalton, MSN, [facility], Center for [facility]

## 2023-07-22 ENCOUNTER — Ambulatory Visit (INDEPENDENT_AMBULATORY_CARE_PROVIDER_SITE_OTHER): Payer: Self-pay | Admitting: Obstetrics & Gynecology

## 2023-07-22 VITALS — BP 116/79 | HR 93 | Wt 244.0 lb

## 2023-07-22 DIAGNOSIS — O99213 Obesity complicating pregnancy, third trimester: Secondary | ICD-10-CM | POA: Diagnosis not present

## 2023-07-22 DIAGNOSIS — O24414 Gestational diabetes mellitus in pregnancy, insulin controlled: Secondary | ICD-10-CM

## 2023-07-22 DIAGNOSIS — O0930 Supervision of pregnancy with insufficient antenatal care, unspecified trimester: Secondary | ICD-10-CM

## 2023-07-22 DIAGNOSIS — O10013 Pre-existing essential hypertension complicating pregnancy, third trimester: Secondary | ICD-10-CM

## 2023-07-22 DIAGNOSIS — O0990 Supervision of high risk pregnancy, unspecified, unspecified trimester: Secondary | ICD-10-CM

## 2023-07-22 DIAGNOSIS — O10919 Unspecified pre-existing hypertension complicating pregnancy, unspecified trimester: Secondary | ICD-10-CM

## 2023-07-22 DIAGNOSIS — O24419 Gestational diabetes mellitus in pregnancy, unspecified control: Secondary | ICD-10-CM

## 2023-07-22 DIAGNOSIS — O99210 Obesity complicating pregnancy, unspecified trimester: Secondary | ICD-10-CM

## 2023-07-22 DIAGNOSIS — E669 Obesity, unspecified: Secondary | ICD-10-CM

## 2023-07-22 DIAGNOSIS — Z3A34 34 weeks gestation of pregnancy: Secondary | ICD-10-CM

## 2023-07-22 LAB — GC/CHLAMYDIA PROBE AMP (~~LOC~~) NOT AT ARMC
Chlamydia: NEGATIVE
Comment: NEGATIVE
Comment: NORMAL
Neisseria Gonorrhea: NEGATIVE

## 2023-07-22 MED ORDER — MAGNESIUM CITRATE 200 MG PO TABS
1.0000 | ORAL_TABLET | Freq: Two times a day (BID) | ORAL | 2 refills | Status: DC
Start: 2023-07-22 — End: 2023-08-13

## 2023-07-22 MED ORDER — INSULIN GLARGINE 100 UNIT/ML SOLOSTAR PEN: 14.0000 [IU] | PEN_INJECTOR | Freq: Every day | SUBCUTANEOUS | 1 refills | Status: DC

## 2023-07-22 NOTE — Progress Notes (Unsigned)
   PRENATAL VISIT NOTE  Subjective:  Gina Hurley is a 33 y.o. [phone] at [redacted]w[redacted]d being seen today for ongoing prenatal care.  She is currently monitored for the following issues for this high-risk pregnancy and has Supervision of high risk pregnancy, antepartum; Bipolar 1 disorder (HCC); Chronic hypertension affecting pregnancy; BMI 39.0-39.9,adult; Obesity in pregnancy; Late prenatal care affecting pregnancy, antepartum; Late latent syphilis; Chlamydia trachomatis infection in pregnancy in second trimester; UTI in pregnancy, antepartum, second trimester; High grade squamous intraepithelial lesion (HGSIL), grade 3 CIN, on biopsy of cervix; and Gestational diabetes mellitus, antepartum on their problem list.  Patient reports leg cramps.  Contractions: Not present. Vag. Bleeding: None.  Movement: Present. Denies leaking of fluid.   The following portions of the patient's history were reviewed and updated as appropriate: allergies, current medications, past family history, past medical history, past social history, past surgical history and problem list.   Objective:    Vitals:   07/22/23 1048  BP: 116/79  Pulse: 93  Weight: 244 lb (110.7 kg)    Fetal Status:  Fetal Heart Rate (bpm): 134   Movement: Present    General: Alert, oriented and cooperative. Patient is in no acute distress.  Skin: Skin is warm and dry. No rash noted.   Cardiovascular: Normal heart rate noted  Respiratory: Normal respiratory effort, no problems with respiration noted  Abdomen: Soft, gravid, appropriate for gestational age.  Pain/Pressure: Present     Pelvic: Cervical exam deferred        Extremities: Normal range of motion.  Edema: None  Mental Status: Normal mood and affect. Normal behavior. Normal judgment and thought content.   Assessment and Plan:  Pregnancy: G4P3003 at [redacted]w[redacted]d 1. [redacted] weeks gestation of pregnancy (Primary) Leg cramps - Magnesium Citrate 200 MG TABS; Take 200 mg by mouth in the morning and  at bedtime.  Dispense: 60 tablet; Refill: 2  2. Supervision of high risk pregnancy, antepartum FBS > 95, change PM insulin  - insulin  glargine (LANTUS ) 100 UNIT/ML Solostar Pen; Inject 14 Units into the skin at bedtime.  Dispense: 6 mL; Refill: 1 - Magnesium Citrate 200 MG TABS; Take 200 mg by mouth in the morning and at bedtime.  Dispense: 60 tablet; Refill: 2  3. Obesity in pregnancy   4. Insulin  controlled gestational diabetes mellitus (GDM) during pregnancy, antepartum   5. Late prenatal care affecting pregnancy, antepartum   6. Chronic hypertension affecting pregnancy   7. [redacted] weeks gestation of pregnancy - insulin  glargine (LANTUS ) 100 UNIT/ML Solostar Pen; Inject 14 Units into the skin at bedtime.  Dispense: 6 mL; Refill: 1  8. Gestational diabetes mellitus (GDM), antepartum, gestational diabetes method of control unspecified  - insulin  glargine (LANTUS ) 100 UNIT/ML Solostar Pen; Inject 14 Units into the skin at bedtime.  Dispense: 6 mL; Refill: 1  Preterm labor symptoms and general obstetric precautions including but not limited to vaginal bleeding, contractions, leaking of fluid and fetal movement were reviewed in detail with the patient. Please refer to After Visit Summary for other counseling recommendations.   Return in about 2 weeks (around 08/05/2023).  Future Appointments  Date Time Provider Department Center  07/23/2023 11:00 AM [facility] PROVIDER 1 [facility]  07/23/2023 11:30 AM [facility] US2 [facility]  08/04/2023 10:55 AM Cleatus Moccasin, MD [facility]  08/11/2023 10:55 AM Ilean Norleen GAILS, MD [facility]  08/20/2023 10:55 AM Fredirick Glenys RAMAN, MD [facility]    Lynwood Solomons, MD

## 2023-07-23 ENCOUNTER — Other Ambulatory Visit: Payer: Self-pay | Admitting: *Deleted

## 2023-07-23 ENCOUNTER — Ambulatory Visit (HOSPITAL_BASED_OUTPATIENT_CLINIC_OR_DEPARTMENT_OTHER): Admitting: Maternal & Fetal Medicine

## 2023-07-23 ENCOUNTER — Ambulatory Visit: Attending: Maternal & Fetal Medicine

## 2023-07-23 VITALS — BP 124/72 | HR 98

## 2023-07-23 DIAGNOSIS — O0990 Supervision of high risk pregnancy, unspecified, unspecified trimester: Secondary | ICD-10-CM | POA: Diagnosis present

## 2023-07-23 DIAGNOSIS — O99210 Obesity complicating pregnancy, unspecified trimester: Secondary | ICD-10-CM

## 2023-07-23 DIAGNOSIS — O99213 Obesity complicating pregnancy, third trimester: Secondary | ICD-10-CM | POA: Diagnosis not present

## 2023-07-23 DIAGNOSIS — Z3A34 34 weeks gestation of pregnancy: Secondary | ICD-10-CM | POA: Insufficient documentation

## 2023-07-23 DIAGNOSIS — E669 Obesity, unspecified: Secondary | ICD-10-CM | POA: Diagnosis not present

## 2023-07-23 DIAGNOSIS — O0933 Supervision of pregnancy with insufficient antenatal care, third trimester: Secondary | ICD-10-CM

## 2023-07-23 DIAGNOSIS — O10013 Pre-existing essential hypertension complicating pregnancy, third trimester: Secondary | ICD-10-CM

## 2023-07-23 DIAGNOSIS — O0930 Supervision of pregnancy with insufficient antenatal care, unspecified trimester: Secondary | ICD-10-CM

## 2023-07-23 DIAGNOSIS — O10919 Unspecified pre-existing hypertension complicating pregnancy, unspecified trimester: Secondary | ICD-10-CM | POA: Insufficient documentation

## 2023-07-23 DIAGNOSIS — O98113 Syphilis complicating pregnancy, third trimester: Secondary | ICD-10-CM

## 2023-07-23 DIAGNOSIS — F319 Bipolar disorder, unspecified: Secondary | ICD-10-CM | POA: Diagnosis present

## 2023-07-23 DIAGNOSIS — O24414 Gestational diabetes mellitus in pregnancy, insulin controlled: Secondary | ICD-10-CM

## 2023-07-23 DIAGNOSIS — O24419 Gestational diabetes mellitus in pregnancy, unspecified control: Secondary | ICD-10-CM | POA: Insufficient documentation

## 2023-07-23 DIAGNOSIS — O99343 Other mental disorders complicating pregnancy, third trimester: Secondary | ICD-10-CM

## 2023-07-23 NOTE — Progress Notes (Signed)
 Patient information  Patient Name: Gina Hurley  Patient MRN:   [id number]  Referring practice: MFM Referring Provider: [facility])  Problem List   Patient Active Problem List   Diagnosis Date Noted   Gestational diabetes mellitus, antepartum 05/28/2023   Chlamydia trachomatis infection in pregnancy in second trimester 03/12/2023   UTI in pregnancy, antepartum, second trimester 03/12/2023   High grade squamous intraepithelial lesion (HGSIL), grade 3 CIN, on biopsy of cervix 03/12/2023   Late latent syphilis 03/10/2023   Chronic hypertension affecting pregnancy 03/05/2023   BMI 39.0-39.9,adult 03/05/2023   Obesity in pregnancy 03/05/2023   Late prenatal care affecting pregnancy, antepartum 03/05/2023   Supervision of high risk pregnancy, antepartum 02/25/2023   Bipolar 1 disorder (HCC)     Maternal Fetal medicine Consult  Gina Hurley is a 33 y.o. G4P3003 at [redacted]w[redacted]d here for ultrasound and consultation. Gina Hurley is doing well today with no acute concerns. Today we focused on the following:   The patient reports that her blood sugars are well-controlled today.  Her fasting blood sugars are typically less than 102-hour postprandials are near all at goal.  Her blood pressure is doing well.  I discussed the timing of delivery around 37 weeks.  The patient had time to ask questions that were answered to her satisfaction.  She verbalized understanding and agrees to proceed with the plan below.  Sonographic findings Single intrauterine pregnancy. Fetal cardiac activity: Observed. Presentation: Cephalic. Interval fetal anatomy appears normal. Fetal biometry shows the estimated fetal weight at the 44 percentile. Amniotic fluid: Within normal limits.  MVP: 5.92 cm. Placenta: Anterior. BPP: 8/8.   There are limitations of prenatal ultrasound such as the inability to detect certain abnormalities due to poor visualization. Various factors such as fetal  position, gestational age and maternal body habitus may increase the difficulty in visualizing the fetal anatomy.    Recommendations - Continue weekly antenatal testing with delivery around 37 weeks - Repeat RPR one week prior to delivery for pediatric purposes  Review of Systems: A review of systems was performed and was negative except per HPI   Vitals and Physical Exam    07/23/2023   10:55 AM 07/22/2023   10:48 AM 07/21/2023   11:20 AM  Vitals with BMI  Weight  244 lbs   BMI  40.6   Systolic 124 116 868  Diastolic 72 79 73  Pulse 98 93 80    Sitting comfortably on the sonogram table Nonlabored breathing Normal rate and rhythm Abdomen is nontender  Past pregnancies OB History  Gravida Para Term Preterm AB Living  4 3 3   3   SAB IAB Ectopic Multiple Live Births      3    # Outcome Date GA Lbr Len/2nd Weight Sex Type Anes PTL Lv  4 Current           3 Term 03/23/14 [redacted]w[redacted]d  9 lb 13 oz (4.451 kg) M Vag-Spont None  LIV     Birth Comments: wnl  2 Term 09/28/12 [redacted]w[redacted]d 12:18 / 00:15 8 lb 14.2 oz (4.03 kg) M Vag-Spont EPI  LIV  1 Term 06/03/10 [redacted]w[redacted]d 14:00 8 lb 4 oz (3.742 kg) F Vag-Spont EPI N LIV     Birth Comments: gall bladder issues     I spent 20 minutes reviewing the patients chart, including labs and images as well as counseling the patient about her medical conditions. Greater than 50% of  the time was spent in direct face-to-face patient counseling.  Delora Smaller  MFM, [facility]   07/23/2023  12:34 PM

## 2023-07-31 ENCOUNTER — Ambulatory Visit: Attending: Obstetrics

## 2023-07-31 DIAGNOSIS — O99213 Obesity complicating pregnancy, third trimester: Secondary | ICD-10-CM | POA: Diagnosis not present

## 2023-07-31 DIAGNOSIS — O10919 Unspecified pre-existing hypertension complicating pregnancy, unspecified trimester: Secondary | ICD-10-CM | POA: Diagnosis not present

## 2023-07-31 DIAGNOSIS — Z3A35 35 weeks gestation of pregnancy: Secondary | ICD-10-CM | POA: Insufficient documentation

## 2023-07-31 DIAGNOSIS — O99210 Obesity complicating pregnancy, unspecified trimester: Secondary | ICD-10-CM

## 2023-07-31 DIAGNOSIS — O24415 Gestational diabetes mellitus in pregnancy, controlled by oral hypoglycemic drugs: Secondary | ICD-10-CM | POA: Insufficient documentation

## 2023-07-31 DIAGNOSIS — O24414 Gestational diabetes mellitus in pregnancy, insulin controlled: Secondary | ICD-10-CM

## 2023-07-31 NOTE — Procedures (Signed)
 Gina Hurley December 20, 1990 [redacted]w[redacted]d  Fetus A Non-Stress Test Interpretation for 07/31/23  Indication: Gestational Diabetes medication controlled and Obesity - NST only  Fetal Heart Rate A Mode: External Baseline Rate (A): 140 bpm Variability: Moderate Accelerations: 15 x 15 Decelerations: None Multiple birth?: No  Uterine Activity Mode: Palpation, Toco Contraction Frequency (min): none noted Resting Tone Palpated: Relaxed  Interpretation (Fetal Testing) Nonstress Test Interpretation: Reactive Comments: Reviewed with Dr. Arna

## 2023-08-04 ENCOUNTER — Other Ambulatory Visit: Payer: Self-pay | Admitting: Obstetrics and Gynecology

## 2023-08-04 ENCOUNTER — Ambulatory Visit (INDEPENDENT_AMBULATORY_CARE_PROVIDER_SITE_OTHER): Payer: Self-pay | Admitting: Obstetrics and Gynecology

## 2023-08-04 VITALS — BP 123/83 | HR 102 | Wt 246.0 lb

## 2023-08-04 DIAGNOSIS — O10013 Pre-existing essential hypertension complicating pregnancy, third trimester: Secondary | ICD-10-CM | POA: Diagnosis not present

## 2023-08-04 DIAGNOSIS — D069 Carcinoma in situ of cervix, unspecified: Secondary | ICD-10-CM

## 2023-08-04 DIAGNOSIS — Z3A36 36 weeks gestation of pregnancy: Secondary | ICD-10-CM

## 2023-08-04 DIAGNOSIS — O0990 Supervision of high risk pregnancy, unspecified, unspecified trimester: Secondary | ICD-10-CM

## 2023-08-04 DIAGNOSIS — O9A113 Malignant neoplasm complicating pregnancy, third trimester: Secondary | ICD-10-CM

## 2023-08-04 DIAGNOSIS — O24414 Gestational diabetes mellitus in pregnancy, insulin controlled: Secondary | ICD-10-CM | POA: Diagnosis not present

## 2023-08-04 DIAGNOSIS — O98113 Syphilis complicating pregnancy, third trimester: Secondary | ICD-10-CM

## 2023-08-04 DIAGNOSIS — O10919 Unspecified pre-existing hypertension complicating pregnancy, unspecified trimester: Secondary | ICD-10-CM

## 2023-08-04 DIAGNOSIS — O99210 Obesity complicating pregnancy, unspecified trimester: Secondary | ICD-10-CM

## 2023-08-04 DIAGNOSIS — A528 Late syphilis, latent: Secondary | ICD-10-CM

## 2023-08-04 NOTE — Patient Instructions (Signed)
Congratulations, you're on your way to having your baby!!!   You now have an induction scheduled.   If your induction is scheduled for the daytime, you will see an appointment time in MyChart. Please DO NOT show up at this time, this is just a placeholder on the schedule. You will get a call when your room is ready and will have 2 hours to arrive. The hospital staff can call anytime starting at 5 am through the rest of the day.   You will also get a call from the pre-admission nurse to go over pre-admission screen about 2 days prior to your induction date.      

## 2023-08-04 NOTE — Progress Notes (Signed)
 IOL scheduled for 08/08/23 at [facility] per Dr. Cleatus.    [place], RN

## 2023-08-04 NOTE — Progress Notes (Signed)
 IOL admission orderes placed. GBS not yet available. Method of IOL TBD.   Vina Solian, MD Attending Obstetrician & Gynecologist, [facility] for [facility], [facility]

## 2023-08-04 NOTE — Progress Notes (Signed)
 PRENATAL VISIT NOTE  Subjective:  Gina Hurley is a 33 y.o. [phone] at [redacted]w[redacted]d being seen today for ongoing prenatal care.  She is currently monitored for the following issues for this high-risk pregnancy and has Supervision of high risk pregnancy, antepartum; Bipolar 1 disorder (HCC); Chronic hypertension affecting pregnancy; BMI 39.0-39.9,adult; Obesity in pregnancy; Late prenatal care affecting pregnancy, antepartum; Late latent syphilis; Chlamydia trachomatis infection in pregnancy in second trimester; UTI in pregnancy, antepartum, second trimester; High grade squamous intraepithelial lesion (HGSIL), grade 3 CIN, on biopsy of cervix; and Gestational diabetes mellitus, antepartum on their problem list.  Patient reports back pain, AM nausea and stabbing pains vaginally.  Contractions: Irritability. Vag. Bleeding: None.  Movement: Present. Denies leaking of fluid.   The following portions of the patient's history were reviewed and updated as appropriate: allergies, current medications, past family history, past medical history, past social history, past surgical history and problem list.   Objective:    Vitals:   08/04/23 1056  BP: 123/83  Pulse: (!) 102  Weight: 246 lb (111.6 kg)    Fetal Status:  Fetal Heart Rate (bpm): 138   Movement: Present    General: Alert, oriented and cooperative. Patient is in no acute distress.  Skin: Skin is warm and dry. No rash noted.   Cardiovascular: Normal heart rate noted  Respiratory: Normal respiratory effort, no problems with respiration noted  Abdomen: Soft, gravid, appropriate for gestational age.  Pain/Pressure: Present (pelvic pain)     Pelvic: Cervical exam deferred        Extremities: Normal range of motion.  Edema: Trace  Mental Status: Normal mood and affect. Normal behavior. Normal judgment and thought content.     Assessment and Plan:  Pregnancy: G4P3003 at [redacted]w[redacted]d 1. [redacted] weeks gestation of pregnancy (Primary)  2. Chronic  hypertension affecting pregnancy No meds.  BP wnl  3. Insulin  controlled gestational diabetes mellitus (GDM) during pregnancy, antepartum Lantus  14 units at bedtime. CBG log reviewed and overall controlled. Occasional eelavted fastings. Meals overall normal.  Doing weekly BPP.  Last growth 7/2 - 44%ile, normal AC and AFI.   4. High grade squamous intraepithelial lesion (HGSIL), grade 3 CIN, on biopsy of cervix Needs PP LEEP.   5. Late latent syphilis S/p Rx.  28 wk titer was 1:4. Check now per MFM for peds purposes.   6. Supervision of high risk pregnancy, antepartum GBS swab done today. Pt prefers self swab.  Reviewed timing of delivery. She is agreeable to 37w IOL as indicated by MFM.  Discussed ppBTS and 6 wk LEEP procedure in office vs interval salpingectomy with LEEP procedure. Discussed pros and cons to each. She will consider. Will make PP visits before she leaves to ensure GYN follow up in case she needs to have interval salpingectomy.   7. Obesity in pregnancy  Preterm labor symptoms and general obstetric precautions including but not limited to vaginal bleeding, contractions, leaking of fluid and fetal movement were reviewed in detail with the patient. Please refer to After Visit Summary for other counseling recommendations.   No follow-ups on file.  Future Appointments  Date Time Provider Department Center  08/06/2023 11:15 AM [facility] PROVIDER 1 [facility]  08/06/2023 11:30 AM [facility] US7 [facility]  08/08/2023  6:45 AM [facility] SCHED ROOM [facility] None  08/11/2023 10:55 AM Fredirick Glenys RAMAN, MD [facility]  08/20/2023 10:55 AM Fredirick Glenys RAMAN, MD [facility]  08/28/2023  8:55 AM Anyanwu, Gloris LABOR, MD [facility]  Vina Solian, MD

## 2023-08-05 ENCOUNTER — Telehealth (HOSPITAL_COMMUNITY): Payer: Self-pay | Admitting: *Deleted

## 2023-08-05 NOTE — Telephone Encounter (Signed)
 Preadmission screen

## 2023-08-06 ENCOUNTER — Ambulatory Visit: Attending: Maternal & Fetal Medicine | Admitting: *Deleted

## 2023-08-06 ENCOUNTER — Ambulatory Visit: Admitting: *Deleted

## 2023-08-06 ENCOUNTER — Other Ambulatory Visit

## 2023-08-06 ENCOUNTER — Telehealth (HOSPITAL_COMMUNITY): Payer: Self-pay | Admitting: *Deleted

## 2023-08-06 ENCOUNTER — Encounter (HOSPITAL_COMMUNITY): Payer: Self-pay | Admitting: *Deleted

## 2023-08-06 VITALS — BP 118/70 | HR 96

## 2023-08-06 DIAGNOSIS — O99213 Obesity complicating pregnancy, third trimester: Secondary | ICD-10-CM

## 2023-08-06 DIAGNOSIS — O24414 Gestational diabetes mellitus in pregnancy, insulin controlled: Secondary | ICD-10-CM

## 2023-08-06 DIAGNOSIS — O10913 Unspecified pre-existing hypertension complicating pregnancy, third trimester: Secondary | ICD-10-CM

## 2023-08-06 DIAGNOSIS — Z3A36 36 weeks gestation of pregnancy: Secondary | ICD-10-CM | POA: Diagnosis not present

## 2023-08-06 DIAGNOSIS — O24419 Gestational diabetes mellitus in pregnancy, unspecified control: Secondary | ICD-10-CM | POA: Insufficient documentation

## 2023-08-06 DIAGNOSIS — O10013 Pre-existing essential hypertension complicating pregnancy, third trimester: Secondary | ICD-10-CM | POA: Diagnosis present

## 2023-08-06 LAB — RPR, QUANT+TP ABS (REFLEX)
Rapid Plasma Reagin, Quant: 1:2 {titer} — ABNORMAL HIGH
T Pallidum Abs: REACTIVE — AB

## 2023-08-06 LAB — RPR: RPR Ser Ql: REACTIVE — AB

## 2023-08-06 NOTE — Procedures (Signed)
 Gina Hurley 1990-03-18 [redacted]w[redacted]d  Fetus A Non-Stress Test Interpretation for 08/06/23-NST only  Indication: Chronic Hypertenstion and Gestational Diabetes medication controlled  Fetal Heart Rate A Mode: External Baseline Rate (A): 140 bpm Variability: Moderate Accelerations: 15 x 15 Decelerations: None Multiple birth?: No  Uterine Activity Mode: Toco Contraction Frequency (min): none Contraction Quality: Mild  Interpretation (Fetal Testing) Nonstress Test Interpretation: Reactive Comments: Tracing reviewed by dr. Ileana

## 2023-08-06 NOTE — Telephone Encounter (Signed)
 Preadmission screen

## 2023-08-08 ENCOUNTER — Inpatient Hospital Stay (HOSPITAL_COMMUNITY)

## 2023-08-08 LAB — CULTURE, BETA STREP (GROUP B ONLY): Strep Gp B Culture: NEGATIVE

## 2023-08-09 ENCOUNTER — Inpatient Hospital Stay (HOSPITAL_COMMUNITY)
Admission: RE | Admit: 2023-08-09 | Discharge: 2023-08-13 | DRG: 784 | Disposition: A | Discharge status: Home / Self Care | Attending: Obstetrics and Gynecology | Admitting: Obstetrics and Gynecology

## 2023-08-09 ENCOUNTER — Other Ambulatory Visit: Payer: Self-pay

## 2023-08-09 ENCOUNTER — Encounter (HOSPITAL_COMMUNITY): Payer: Self-pay | Admitting: Obstetrics and Gynecology

## 2023-08-09 DIAGNOSIS — Z6839 Body mass index (BMI) 39.0-39.9, adult: Secondary | ICD-10-CM

## 2023-08-09 DIAGNOSIS — O99214 Obesity complicating childbirth: Secondary | ICD-10-CM | POA: Diagnosis present

## 2023-08-09 DIAGNOSIS — Z5986 Financial insecurity: Secondary | ICD-10-CM

## 2023-08-09 DIAGNOSIS — Z555 Less than a high school diploma: Secondary | ICD-10-CM

## 2023-08-09 DIAGNOSIS — Z3490 Encounter for supervision of normal pregnancy, unspecified, unspecified trimester: Secondary | ICD-10-CM

## 2023-08-09 DIAGNOSIS — O0990 Supervision of high risk pregnancy, unspecified, unspecified trimester: Secondary | ICD-10-CM

## 2023-08-09 DIAGNOSIS — O24424 Gestational diabetes mellitus in childbirth, insulin controlled: Principal | ICD-10-CM | POA: Diagnosis present

## 2023-08-09 DIAGNOSIS — Z8249 Family history of ischemic heart disease and other diseases of the circulatory system: Secondary | ICD-10-CM

## 2023-08-09 DIAGNOSIS — Z3A37 37 weeks gestation of pregnancy: Secondary | ICD-10-CM | POA: Diagnosis not present

## 2023-08-09 DIAGNOSIS — A528 Late syphilis, latent: Secondary | ICD-10-CM | POA: Diagnosis present

## 2023-08-09 DIAGNOSIS — O24419 Gestational diabetes mellitus in pregnancy, unspecified control: Secondary | ICD-10-CM

## 2023-08-09 DIAGNOSIS — O98113 Syphilis complicating pregnancy, third trimester: Secondary | ICD-10-CM | POA: Diagnosis not present

## 2023-08-09 DIAGNOSIS — Z7982 Long term (current) use of aspirin: Secondary | ICD-10-CM

## 2023-08-09 DIAGNOSIS — O24414 Gestational diabetes mellitus in pregnancy, insulin controlled: Secondary | ICD-10-CM

## 2023-08-09 DIAGNOSIS — A568 Sexually transmitted chlamydial infection of other sites: Secondary | ICD-10-CM | POA: Diagnosis present

## 2023-08-09 DIAGNOSIS — Z98891 History of uterine scar from previous surgery: Principal | ICD-10-CM

## 2023-08-09 DIAGNOSIS — D069 Carcinoma in situ of cervix, unspecified: Secondary | ICD-10-CM | POA: Diagnosis present

## 2023-08-09 DIAGNOSIS — Z9079 Acquired absence of other genital organ(s): Secondary | ICD-10-CM

## 2023-08-09 DIAGNOSIS — F319 Bipolar disorder, unspecified: Secondary | ICD-10-CM | POA: Diagnosis present

## 2023-08-09 DIAGNOSIS — O0930 Supervision of pregnancy with insufficient antenatal care, unspecified trimester: Secondary | ICD-10-CM

## 2023-08-09 DIAGNOSIS — Z302 Encounter for sterilization: Secondary | ICD-10-CM | POA: Diagnosis not present

## 2023-08-09 DIAGNOSIS — O1002 Pre-existing essential hypertension complicating childbirth: Secondary | ICD-10-CM | POA: Diagnosis not present

## 2023-08-09 DIAGNOSIS — Z5941 Food insecurity: Secondary | ICD-10-CM | POA: Diagnosis not present

## 2023-08-09 DIAGNOSIS — Z91013 Allergy to seafood: Secondary | ICD-10-CM | POA: Diagnosis not present

## 2023-08-09 DIAGNOSIS — E66813 Obesity, class 3: Secondary | ICD-10-CM | POA: Diagnosis present

## 2023-08-09 DIAGNOSIS — O9081 Anemia of the puerperium: Secondary | ICD-10-CM | POA: Diagnosis not present

## 2023-08-09 DIAGNOSIS — Z833 Family history of diabetes mellitus: Secondary | ICD-10-CM

## 2023-08-09 DIAGNOSIS — O10919 Unspecified pre-existing hypertension complicating pregnancy, unspecified trimester: Principal | ICD-10-CM | POA: Diagnosis present

## 2023-08-09 DIAGNOSIS — D62 Acute posthemorrhagic anemia: Secondary | ICD-10-CM | POA: Diagnosis not present

## 2023-08-09 DIAGNOSIS — O1092 Unspecified pre-existing hypertension complicating childbirth: Secondary | ICD-10-CM | POA: Diagnosis present

## 2023-08-09 DIAGNOSIS — O99344 Other mental disorders complicating childbirth: Secondary | ICD-10-CM | POA: Diagnosis not present

## 2023-08-09 DIAGNOSIS — O2342 Unspecified infection of urinary tract in pregnancy, second trimester: Secondary | ICD-10-CM | POA: Diagnosis present

## 2023-08-09 DIAGNOSIS — Z87891 Personal history of nicotine dependence: Secondary | ICD-10-CM | POA: Diagnosis not present

## 2023-08-09 DIAGNOSIS — Z3A00 Weeks of gestation of pregnancy not specified: Secondary | ICD-10-CM | POA: Diagnosis not present

## 2023-08-09 LAB — COMPREHENSIVE METABOLIC PANEL WITH GFR
ALT: 8 U/L (ref 0–44)
AST: 20 U/L (ref 15–41)
Albumin: 2.6 g/dL — ABNORMAL LOW (ref 3.5–5.0)
Alkaline Phosphatase: 110 U/L (ref 38–126)
Anion gap: 14 (ref 5–15)
BUN: 7 mg/dL (ref 6–20)
CO2: 19 mmol/L — ABNORMAL LOW (ref 22–32)
Calcium: 8.7 mg/dL — ABNORMAL LOW (ref 8.9–10.3)
Chloride: 101 mmol/L (ref 98–111)
Creatinine, Ser: 0.47 mg/dL (ref 0.44–1.00)
GFR, Estimated: >60 mL/min (ref >60)
Glucose, Bld: 84 mg/dL (ref 70–99)
Potassium: 3.9 mmol/L (ref 3.5–5.1)
Sodium: 134 mmol/L — ABNORMAL LOW (ref 135–145)
Total Bilirubin: 0.4 mg/dL (ref 0.0–1.2)
Total Protein: 6.2 g/dL — ABNORMAL LOW (ref 6.5–8.1)

## 2023-08-09 LAB — GLUCOSE, CAPILLARY
Glucose-Capillary: 90 mg/dL (ref 70–99)
Glucose-Capillary: 89 mg/dL (ref 70–99)
Glucose-Capillary: 157 mg/dL — ABNORMAL HIGH (ref 70–99)
Glucose-Capillary: 109 mg/dL — ABNORMAL HIGH (ref 70–99)
Glucose-Capillary: 83 mg/dL (ref 70–99)

## 2023-08-09 LAB — CBC
HCT: 31.9 % — ABNORMAL LOW (ref 36.0–46.0)
Hemoglobin: 11.1 g/dL — ABNORMAL LOW (ref 12.0–15.0)
MCH: 30.9 pg (ref 26.0–34.0)
MCHC: 34.8 g/dL (ref 30.0–36.0)
MCV: 88.9 fL (ref 80.0–100.0)
Platelets: 240 K/uL (ref 150–400)
RBC: 3.59 MIL/uL — ABNORMAL LOW (ref 3.87–5.11)
RDW: 13.2 % (ref 11.5–15.5)
WBC: 8.5 K/uL (ref 4.0–10.5)
nRBC: 0 % (ref 0.0–0.2)

## 2023-08-09 LAB — RPR
RPR Ser Ql: REACTIVE — AB
RPR Titer: 1:2 {titer}

## 2023-08-09 LAB — TYPE AND SCREEN
ABO/RH(D): A POS
Antibody Screen: NEGATIVE

## 2023-08-09 LAB — PROTEIN / CREATININE RATIO, URINE
Creatinine, Urine: 52 mg/dL
Total Protein, Urine: <6 mg/dL

## 2023-08-09 MED ORDER — TERBUTALINE SULFATE 1 MG/ML IJ SOLN: 0.2500 mg | Freq: Once | INTRAMUSCULAR | Status: DC | PRN

## 2023-08-09 MED ORDER — OXYTOCIN-SODIUM CHLORIDE 30-0.9 UT/500ML-% IV SOLN
1.0000 m[IU]/min | INTRAVENOUS | Status: DC
  Administered 2023-08-10 (×2): 2 m[IU]/min via INTRAVENOUS
  Administered 2023-08-11: 38 m[IU]/min via INTRAVENOUS
  Filled 2023-08-09 (×3): qty 500

## 2023-08-09 MED ORDER — MISOPROSTOL 25 MCG QUARTER TABLET
25.0000 ug | ORAL_TABLET | Freq: Once | ORAL | Status: AC
  Administered 2023-08-09: 25 ug via VAGINAL
  Filled 2023-08-09: qty 1

## 2023-08-09 MED ORDER — FENTANYL CITRATE (PF) 100 MCG/2ML IJ SOLN
50.0000 ug | INTRAMUSCULAR | Status: DC | PRN
  Administered 2023-08-09 – 2023-08-11 (×10): 100 ug via INTRAVENOUS
  Filled 2023-08-09 (×10): qty 2

## 2023-08-09 MED ORDER — OXYTOCIN BOLUS FROM INFUSION: 333.0000 mL | Freq: Once | INTRAVENOUS | Status: DC

## 2023-08-09 MED ORDER — SOD CITRATE-CITRIC ACID 500-334 MG/5ML PO SOLN
30.0000 mL | ORAL | Status: DC | PRN
  Administered 2023-08-11: 30 mL via ORAL
  Filled 2023-08-09: qty 30

## 2023-08-09 MED ORDER — LACTATED RINGERS IV SOLN
500.0000 mL | INTRAVENOUS | Status: DC | PRN
  Administered 2023-08-11: 500 mL via INTRAVENOUS

## 2023-08-09 MED ORDER — MISOPROSTOL 50MCG HALF TABLET
50.0000 ug | ORAL_TABLET | Freq: Once | ORAL | Status: AC
  Administered 2023-08-09: 50 ug via ORAL
  Filled 2023-08-09: qty 1

## 2023-08-09 MED ORDER — MISOPROSTOL 50MCG HALF TABLET
50.0000 ug | ORAL_TABLET | Freq: Once | ORAL | Status: AC
Start: 2023-08-09 — End: 2023-08-09
  Administered 2023-08-09: 50 ug via ORAL
  Filled 2023-08-09: qty 1

## 2023-08-09 MED ORDER — LACTATED RINGERS IV SOLN: INTRAVENOUS | Status: DC

## 2023-08-09 MED ORDER — OXYTOCIN-SODIUM CHLORIDE 30-0.9 UT/500ML-% IV SOLN: 2.5000 [IU]/h | INTRAVENOUS | Status: DC

## 2023-08-09 MED ORDER — ACETAMINOPHEN 325 MG PO TABS
650.0000 mg | ORAL_TABLET | ORAL | Status: DC | PRN
  Administered 2023-08-11: 650 mg via ORAL
  Filled 2023-08-09: qty 2

## 2023-08-09 MED ORDER — ONDANSETRON HCL 4 MG/2ML IJ SOLN
4.0000 mg | Freq: Four times a day (QID) | INTRAMUSCULAR | Status: DC | PRN
  Administered 2023-08-11: 4 mg via INTRAVENOUS
  Filled 2023-08-09: qty 2

## 2023-08-09 MED ORDER — LIDOCAINE HCL (PF) 1 % IJ SOLN: 30.0000 mL | INTRAMUSCULAR | Status: DC | PRN

## 2023-08-09 NOTE — H&P (Signed)
 OBSTETRIC ADMISSION HISTORY AND PHYSICAL  Gina Hurley is a 33 y.o. female [phone] with IUP at [redacted]w[redacted]d by LMP presenting for IOL for A2GDM (AGA) and CHTN (no medications). She reports +FMs, No LOF, no VB, no blurry vision, headaches or peripheral edema, and RUQ pain.  She plans on bottle feeding. She request BTL (consent signed 05/23/23) for birth control. She received her prenatal care at [facility]   Dating: By LMP --->  Estimated Date of Delivery: 08/29/23  Sono:   @[redacted]w[redacted]d , CWD, normal female anatomy, cephalic presentation, anterior placental lie, 2488 gm 5 lb 8 oz 44 %  EFW  Prenatal History/Complications:  - A2 GDM on insulin  Lantus  - Chronic hypertension - BMI 38 - Syphilis; RPR 1:2 - Late to prenatal care - Low lying placenta; resolved - HGSIL pap, +HRHPV +16, needs postpartum LEEP - E. Coli bacteruria   Past Medical History: Past Medical History:  Diagnosis Date   Bipolar 1 disorder (HCC)    Gallbladder attack    Gestational diabetes    Latent syphilis    Mental disorder     Past Surgical History: Past Surgical History:  Procedure Laterality Date   ADENOIDECTOMY     as a child   CHOLECYSTECTOMY N/A 02/16/2021   Procedure: LAPAROSCOPIC CHOLECYSTECTOMY;  Surgeon: Evonnie Dorothyann LABOR, DO;  Location: [facility];  Service: General;  Laterality: N/A;   ERCP     ERCP with sphincterotomy, sludge in CBD s/p removal, all apparent sludge/stones removed.   Tubes in Ears      Obstetrical History: OB History     Gravida  4   Para  3   Term  3   Preterm      AB      Living  3      SAB      IAB      Ectopic      Multiple      Live Births  3           Social History Social History   Socioeconomic History   Marital status: Single    Spouse name: Not on file   Number of children: Not on file   Years of education: Not on file   Highest education level: 11th grade  Occupational History   Not on file  Tobacco Use   Smoking status: Former    Types:  Cigarettes   Smokeless tobacco: Never  Vaping Use   Vaping status: Former   Quit date: 09/12/2022  Substance and Sexual Activity   Alcohol use: Not Currently   Drug use: No   Sexual activity: Yes    Birth control/protection: None  Other Topics Concern   Not on file  Social History Narrative   Not on file   Social Drivers of Health   Financial Resource Strain: Medium Risk (07/11/2023)   Overall Financial Resource Strain (CARDIA)    Difficulty of Paying Living Expenses: Somewhat hard  Food Insecurity: Food Insecurity Present (08/09/2023)   Hunger Vital Sign    Worried About Running Out of Food in the Last Year: Sometimes true    Ran Out of Food in the Last Year: Sometimes true  Transportation Needs: No Transportation Needs (08/09/2023)   PRAPARE - Administrator, Civil Service (Medical): No    Lack of Transportation (Non-Medical): No  Physical Activity: Sufficiently Active (07/11/2023)   Exercise Vital Sign    Days of Exercise per Week: 7 days    Minutes of Exercise  per Session: 150+ min  Stress: No Stress Concern Present (07/11/2023)   [employer] of Occupational Health - Occupational Stress Questionnaire    Feeling of Stress: Not at all  Social Connections: Socially Isolated (08/09/2023)   Social Connection and Isolation Panel    Frequency of Communication with Friends and Family: Once a week    Frequency of Social Gatherings with Friends and Family: Never    Attends Religious Services: Never    Database administrator or Organizations: No    Attends Engineer, structural: Not on file    Marital Status: Living with partner    Family History: Family History  Problem Relation Age of Onset   Diabetes Mother    Heart disease Father    Diabetes Father    Hypertension Father    Diabetes Maternal Aunt    Hypertension Maternal Aunt    Hypertension Maternal Uncle    Hypertension Maternal Grandmother    Asthma Paternal Grandmother      Allergies: Allergies  Allergen Reactions   Shellfish Allergy Anaphylaxis and Swelling   Iodine Rash    Medications Prior to Admission  Medication Sig Dispense Refill Last Dose/Taking   Prenatal Vit-Fe Fumarate-FA (PRENATAL VITAMINS PO) Take 1 tablet by mouth daily at 6 (six) AM.   08/08/2023   Accu-Chek Softclix Lancets lancets Use as instructed 100 each 12    aspirin  EC 81 MG tablet Take 1 tablet (81 mg total) by mouth daily. 60 tablet 1    Blood Glucose Monitoring Suppl (ACCU-CHEK GUIDE) w/Device KIT 1 Device by Does not apply route 4 (four) times daily. 1 kit 0    cyclobenzaprine  (FLEXERIL ) 10 MG tablet Take 1 tablet (10 mg total) by mouth 2 (two) times daily as needed for muscle spasms. 20 tablet 0    EMBECTA PEN NEEDLE NANO 2 GEN 32G X 4 MM MISC PLEASE USE AS DIRECTED 100 each 1    glucose blood (ACCU-CHEK GUIDE TEST) test strip 1 each by Other route 4 (four) times daily. Use as instructed 100 each 12    insulin  glargine (LANTUS ) 100 UNIT/ML Solostar Pen Inject 14 Units into the skin at bedtime. 6 mL 1    Magnesium  Citrate 200 MG TABS Take 200 mg by mouth in the morning and at bedtime. 60 tablet 2      Review of Systems   All systems reviewed and negative except as stated in HPI  Blood pressure 115/61, pulse 84, temperature 97.8 F (36.6 C), temperature source Oral, resp. rate 18, height 5' 5 (1.651 m), weight 112.8 kg, last menstrual period 11/22/2022, SpO2 100%. General appearance: alert, cooperative, and appears stated age Lungs: no increased WOB Heart: regular rate Abdomen: soft, non-tender, gravid Pelvic: Normal external genitalia Extremities: Homans sign is negative, no sign of DVT Presentation: cephalic by BSUS  Fetal monitoring Baseline: 135 bpm, Variability: Good {> 6 bpm), Accelerations: Reactive, and Decelerations: Absent Uterine activity None  Dilation: Fingertip Effacement (%): 50 Station: Ballotable Exam by:: Dr. Nicholaus  Prenatal labs: ABO, Rh:  --/--/A POS (07/19 0730) Antibody: NEG (07/19 0730) Rubella: 7.12 (02/12 1012) RPR: Reactive (07/14 1133)  HBsAg: Negative (02/12 1012)  HIV: Non Reactive (05/02 0837)  GBS: Negative/-- (07/14 1303)    Lab Results  Component Value Date   GBS Negative 08/04/2023   GTT abnormal, 98/140/114 Genetic screening  declined Anatomy US  normal female  Immunization History  Administered Date(s) Administered   Pneumococcal Polysaccharide-23 09/30/2012   Tdap 08/12/2012, 05/23/2023  Prenatal Transfer Tool  Maternal Diabetes: Yes:  Diabetes Type:  Insulin /Medication controlled Genetic Screening: Declined Maternal Ultrasounds/Referrals: Normal Fetal Ultrasounds or other Referrals:  None Maternal Substance Abuse:  No Significant Maternal Medications:  None Significant Maternal Lab Results: Group B Strep negative Number of Prenatal Visits:greater than 3 verified prenatal visits Maternal Vaccinations:TDap Other Comments:  None  Results for orders placed or performed during the hospital encounter of 08/09/23 (from the past 24 hours)  CBC   Collection Time: 08/09/23  7:00 AM  Result Value Ref Range   WBC 8.5 4.0 - 10.5 K/uL   RBC 3.59 (L) 3.87 - 5.11 MIL/uL   Hemoglobin 11.1 (L) 12.0 - 15.0 g/dL   HCT 68.0 (L) 63.9 - 53.9 %   MCV 88.9 80.0 - 100.0 fL   MCH 30.9 26.0 - 34.0 pg   MCHC 34.8 30.0 - 36.0 g/dL   RDW 86.7 88.4 - 84.4 %   Platelets 240 150 - 400 K/uL   nRBC 0.0 0.0 - 0.2 %  Comprehensive metabolic panel   Collection Time: 08/09/23  7:00 AM  Result Value Ref Range   Sodium 134 (L) 135 - 145 mmol/L   Potassium 3.9 3.5 - 5.1 mmol/L   Chloride 101 98 - 111 mmol/L   CO2 19 (L) 22 - 32 mmol/L   Glucose, Bld 84 70 - 99 mg/dL   BUN 7 6 - 20 mg/dL   Creatinine, Ser 9.52 0.44 - 1.00 mg/dL   Calcium  8.7 (L) 8.9 - 10.3 mg/dL   Total Protein 6.2 (L) 6.5 - 8.1 g/dL   Albumin 2.6 (L) 3.5 - 5.0 g/dL   AST 20 15 - 41 U/L   ALT 8 0 - 44 U/L   Alkaline Phosphatase 110 38 - 126 U/L    Total Bilirubin 0.4 0.0 - 1.2 mg/dL   GFR, Estimated >39 >39 mL/min   Anion gap 14 5 - 15  Protein / creatinine ratio, urine   Collection Time: 08/09/23  7:00 AM  Result Value Ref Range   Creatinine, Urine 52 mg/dL   Total Protein, Urine <6 mg/dL   Protein Creatinine Ratio        0.00 - 0.15 mg/mg[Cre]  Type and screen   Collection Time: 08/09/23  7:30 AM  Result Value Ref Range   ABO/RH(D) A POS    Antibody Screen NEG    Sample Expiration      08/12/2023,2359 Performed at [facility], 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 G4P3003 at [redacted]w[redacted]d here for IOL for A2 GDM on insulin  and CHTN not on medications  #Labor: Unfavorable cervix. Start dual cytotec . Discussed roles for  FB, AROM, pitocin . #Pain: Per pt request #FWB: Cat I #GBS status: negative #Feeding: Formula #Reproductive Life planning: Tubal Ligation  #CHTN: Not on medications. Asymptomatic. CBC, CMP, P:C at admission  #A2 GDM Per last clinic note: Lantus  14 units at bedtime. CBG log reviewed and overall controlled. Occasional elevated fastings. Meals overall normal. Last growth 7/2 - 44%ile, normal  AC and AFI.  - q4h CBG in latent labor, q2h in active labor; endotool or sliding scale for persistently elevated readings  #Late latent syphilis: Received PCN x3 during pregnancy. 7/14 titers 1:2 #Chlamydia positive 2nd trimester: treated with negative TOC 7/1  #CIN 3: PP LEEP  Almarie CHRISTELLA Moats, MD  08/09/2023, 11:13 AM

## 2023-08-09 NOTE — Progress Notes (Signed)
 LABOR PROGRESS NOTE  Patient Name: Gina Hurley, female   DOB: 1990/12/09, 33 y.o.  MRN: [id number]  FB out. Pt feeling increasing discomfort with contractions. Discussed role, risks, benefits of AROM; pt agreeable. Cervix unreachable. Babe ballotable. Will start Pitocin  2x2 once 4 hours after last dose of cytotec . Cat I. Anticipate SVD.   Mardy Shropshire, MD

## 2023-08-09 NOTE — Plan of Care (Signed)

## 2023-08-09 NOTE — Progress Notes (Signed)
 LABOR PROGRESS NOTE  Patient Name: Gina Hurley, female   DOB: 01/19/1991, 33 y.o.  MRN: [id number]  Patient feeling rare cramping. Mild. Cervix softer but virtually unchanged. Additional buccal cytotec  given. Cat I.  #A2GDM: sugars normal  Gina CHRISTELLA Moats, MD

## 2023-08-09 NOTE — Progress Notes (Signed)
 LABOR PROGRESS NOTE  Patient Name: Gina Hurley, female   DOB: 03/02/1990, 33 y.o.  MRN: [id number]  Patient not feeling much with cytotec . Motivated for balloon placement to try something else. Fentanyl  given pre-procedure for patient comfort. Discussed R/B/A of FB placement, and verbal consent obtained. Placed Cook with some difficulty and balloon filled with 60 cc of saline. Mom and babe tolerated well. Cat I. Will give additional buccal cytotec .  Almarie CHRISTELLA Moats, MD

## 2023-08-10 LAB — GLUCOSE, CAPILLARY
Glucose-Capillary: 79 mg/dL (ref 70–99)
Glucose-Capillary: 93 mg/dL (ref 70–99)
Glucose-Capillary: 84 mg/dL (ref 70–99)
Glucose-Capillary: 88 mg/dL (ref 70–99)
Glucose-Capillary: 91 mg/dL (ref 70–99)
Glucose-Capillary: 123 mg/dL — ABNORMAL HIGH (ref 70–99)

## 2023-08-10 MED ORDER — CALCIUM CARBONATE ANTACID 500 MG PO CHEW
2.0000 | CHEWABLE_TABLET | Freq: Once | ORAL | Status: AC
  Administered 2023-08-10: 400 mg via ORAL
  Filled 2023-08-10: qty 2

## 2023-08-10 NOTE — Progress Notes (Signed)
 LABOR PROGRESS NOTE  Patient Name: Gina Hurley, female   DOB: 08/04/1990, 33 y.o.  MRN: [id number]  Now feeling contractions strongly since 3 PM. Has had 1 dose of fentanyl . Cervix now 4/50/-3. Continue to titrate pitocin  as needed. Cat I.  Almarie CHRISTELLA Moats, MD

## 2023-08-10 NOTE — Progress Notes (Signed)
 Pt finished walking back into bed.  Monitors re adjusted

## 2023-08-10 NOTE — Progress Notes (Signed)
 LABOR PROGRESS NOTE  Patient Name: [name], female   DOB: 10/15/1990, 33 y.o.  MRN: [id number]  On 22U pit. Not feeling any contractions. Amenable to check. Cervix has come forward; not as posterior. Cervix 2/50/ballotable. Discussed risks of AROM when ballotable including cord prolapse, however feel this is necessary for progress. Encouraged patient to walk around unit and try some position changes to promote descent. Will continue pitocin  and re-evaluate for AROM in a few hours. Additionally BSUS used. Confirmed vertex and did not see cord around head. Cat I.  Almarie CHRISTELLA Moats, MD

## 2023-08-10 NOTE — Progress Notes (Signed)
 Gina Hurley is a 33 y.o. [phone] at [redacted]w[redacted]d by LMP admitted for induction of labor due to gestational diabetes.  Subjective: Resting in recliner. Feeling sharp, brief pain with contraction but is able to continue talking through them. This does not feel like previous active labor.  Objective: BP 124/75   Pulse 75   Temp 97.8 F (36.6 C) (Oral)   Resp 18   Ht 5' 5 (1.651 m)   Wt 112.8 kg   LMP 11/22/2022 (Within Days)   SpO2 100%   BMI 41.39 kg/m  No intake/output data recorded. No intake/output data recorded.  FHT:  FHR: 135 bpm, variability: moderate,  accelerations:  Present,  decelerations:  Absent UC:   irregular per patient, UI seen on TOCO SVE:   Dilation: 4 Effacement (%): 50 Station: -3 Exam by:: Dr FORBES Moats  Labs: Lab Results  Component Value Date   WBC 8.5 08/09/2023   HGB 11.1 (L) 08/09/2023   HCT 31.9 (L) 08/09/2023   MCV 88.9 08/09/2023   PLT 240 08/09/2023    Assessment / Plan:  Labor: Pitocin  started this morning at 0200; currently at 29mu/min with minimal change following AROM at 1400. Discussed Pitocin  break for 2 hours, verbal order for calcium  given, plan to place IUPC and restart Pitocin  around 2250 A1GDM: CBG q4; last 5 values <95 Fetal Wellbeing:  Category I Pain Control:  Labor support without medications I/D:  GBS neg Anticipated MOD:  NSVD  Vernell FORBES Ruddle, Student-MidWife 08/10/2023, 9:42 PM

## 2023-08-10 NOTE — Progress Notes (Signed)
 LABOR PROGRESS NOTE  Patient Name: Gina Hurley, female   DOB: 08/14/1990, 33 y.o.  MRN: [id number]  Patient has walked and moved position changes frequently since last exam. Now Cervix 3/50/-3. Babe now applied. R/B/A of AROM discussed with patient, and verbal consent obtained. Obtained scant clear fluid with bloody show. Mom and babe tolerated well. Cat I. Continue pit.  #A2GDM: CBGs to goal on light laboring diet.  Almarie CHRISTELLA Moats, MD

## 2023-08-10 NOTE — Progress Notes (Signed)
 Pt instructed by FORBES Moats to walk in halls.  MD informed that nurse will not be able to obtain fhr tracing.  MD agreed to spot check fhr every 10-15 minutes

## 2023-08-11 ENCOUNTER — Inpatient Hospital Stay (HOSPITAL_COMMUNITY): Admitting: Anesthesiology

## 2023-08-11 ENCOUNTER — Encounter (HOSPITAL_COMMUNITY): Payer: Self-pay | Admitting: Obstetrics and Gynecology

## 2023-08-11 ENCOUNTER — Encounter: Payer: Self-pay | Admitting: Family Medicine

## 2023-08-11 ENCOUNTER — Encounter (HOSPITAL_COMMUNITY)
Admission: RE | Disposition: A | Discharge status: Home / Self Care | Payer: Self-pay | Source: Home / Self Care | Attending: Obstetrics and Gynecology

## 2023-08-11 DIAGNOSIS — O98113 Syphilis complicating pregnancy, third trimester: Secondary | ICD-10-CM

## 2023-08-11 DIAGNOSIS — Z3A37 37 weeks gestation of pregnancy: Secondary | ICD-10-CM

## 2023-08-11 DIAGNOSIS — Z302 Encounter for sterilization: Secondary | ICD-10-CM

## 2023-08-11 DIAGNOSIS — O99344 Other mental disorders complicating childbirth: Secondary | ICD-10-CM

## 2023-08-11 DIAGNOSIS — O1002 Pre-existing essential hypertension complicating childbirth: Secondary | ICD-10-CM

## 2023-08-11 DIAGNOSIS — O24424 Gestational diabetes mellitus in childbirth, insulin controlled: Secondary | ICD-10-CM

## 2023-08-11 DIAGNOSIS — Z3A00 Weeks of gestation of pregnancy not specified: Secondary | ICD-10-CM

## 2023-08-11 LAB — CBC
HCT: 34.3 % — ABNORMAL LOW (ref 36.0–46.0)
Hemoglobin: 11.8 g/dL — ABNORMAL LOW (ref 12.0–15.0)
MCH: 30.6 pg (ref 26.0–34.0)
MCHC: 34.4 g/dL (ref 30.0–36.0)
MCV: 88.9 fL (ref 80.0–100.0)
Platelets: 221 K/uL (ref 150–400)
RBC: 3.86 MIL/uL — ABNORMAL LOW (ref 3.87–5.11)
RDW: 13.1 % (ref 11.5–15.5)
WBC: 16.9 K/uL — ABNORMAL HIGH (ref 4.0–10.5)
nRBC: 0 % (ref 0.0–0.2)

## 2023-08-11 LAB — T.PALLIDUM AB, TOTAL: T Pallidum Abs: REACTIVE — AB

## 2023-08-11 LAB — GLUCOSE, CAPILLARY
Glucose-Capillary: 122 mg/dL — ABNORMAL HIGH (ref 70–99)
Glucose-Capillary: 98 mg/dL (ref 70–99)
Glucose-Capillary: 113 mg/dL — ABNORMAL HIGH (ref 70–99)
Glucose-Capillary: 92 mg/dL (ref 70–99)
Glucose-Capillary: 96 mg/dL (ref 70–99)
Glucose-Capillary: 122 mg/dL — ABNORMAL HIGH (ref 70–99)

## 2023-08-11 LAB — PROTEIN / CREATININE RATIO, URINE
Creatinine, Urine: 26 mg/dL
Total Protein, Urine: <6 mg/dL

## 2023-08-11 SURGERY — Surgical Case
Anesthesia: Epidural

## 2023-08-11 MED ORDER — SCOPOLAMINE 1 MG/3DAYS TD PT72
MEDICATED_PATCH | TRANSDERMAL | Status: DC | PRN
  Administered 2023-08-11: 1 via TRANSDERMAL

## 2023-08-11 MED ORDER — EPHEDRINE 5 MG/ML INJ: 10.0000 mg | INTRAVENOUS | Status: DC | PRN

## 2023-08-11 MED ORDER — SOD CITRATE-CITRIC ACID 500-334 MG/5ML PO SOLN: 30.0000 mL | ORAL | Status: DC

## 2023-08-11 MED ORDER — PHENYLEPHRINE 80 MCG/ML (10ML) SYRINGE FOR IV PUSH (FOR BLOOD PRESSURE SUPPORT)
80.0000 ug | PREFILLED_SYRINGE | INTRAVENOUS | Status: DC | PRN
  Filled 2023-08-11: qty 10

## 2023-08-11 MED ORDER — LACTATED RINGERS IV SOLN
500.0000 mL | Freq: Once | INTRAVENOUS | Status: AC
  Administered 2023-08-11: 500 mL via INTRAVENOUS

## 2023-08-11 MED ORDER — LIDOCAINE HCL (PF) 1 % IJ SOLN
INTRAMUSCULAR | Status: DC | PRN
  Administered 2023-08-11 (×2): 4 mL via EPIDURAL

## 2023-08-11 MED ORDER — MORPHINE SULFATE (PF) 0.5 MG/ML IJ SOLN
INTRAMUSCULAR | Status: AC
  Filled 2023-08-11: qty 10

## 2023-08-11 MED ORDER — PHENYLEPHRINE HCL (PRESSORS) 10 MG/ML IV SOLN
INTRAVENOUS | Status: DC | PRN
  Administered 2023-08-11: 160 ug via INTRAVENOUS

## 2023-08-11 MED ORDER — FENTANYL CITRATE (PF) 100 MCG/2ML IJ SOLN
INTRAMUSCULAR | Status: AC
  Filled 2023-08-11: qty 2

## 2023-08-11 MED ORDER — TRANEXAMIC ACID-NACL 1000-0.7 MG/100ML-% IV SOLN
INTRAVENOUS | Status: DC | PRN
Start: 2023-08-11 — End: 2023-08-12
  Administered 2023-08-11: 1000 mg via INTRAVENOUS

## 2023-08-11 MED ORDER — DIPHENHYDRAMINE HCL 50 MG/ML IJ SOLN
INTRAMUSCULAR | Status: DC | PRN
  Administered 2023-08-11: 12.5 mg via INTRAVENOUS

## 2023-08-11 MED ORDER — CEFAZOLIN SODIUM-DEXTROSE 2-4 GM/100ML-% IV SOLN: 2.0000 g | INTRAVENOUS | Status: DC

## 2023-08-11 MED ORDER — SODIUM CHLORIDE 0.9 % IV SOLN
500.0000 mg | INTRAVENOUS | Status: DC
  Filled 2023-08-11: qty 5

## 2023-08-11 MED ORDER — DIPHENHYDRAMINE HCL 50 MG/ML IJ SOLN: 12.5000 mg | INTRAMUSCULAR | Status: DC | PRN

## 2023-08-11 MED ORDER — FENTANYL-BUPIVACAINE-NACL 0.5-0.125-0.9 MG/250ML-% EP SOLN
12.0000 mL/h | EPIDURAL | Status: DC | PRN
  Administered 2023-08-11: 12 mL/h via EPIDURAL
  Filled 2023-08-11: qty 250

## 2023-08-11 MED ORDER — LIDOCAINE-EPINEPHRINE (PF) 2 %-1:200000 IJ SOLN
INTRAMUSCULAR | Status: DC | PRN
Start: 2023-08-11 — End: 2023-08-12
  Administered 2023-08-11: 4 mL via EPIDURAL
  Administered 2023-08-11: 6 mL via EPIDURAL
  Administered 2023-08-11: 3 mL via EPIDURAL

## 2023-08-11 MED ORDER — LACTATED RINGERS IV SOLN: INTRAVENOUS | Status: DC | PRN

## 2023-08-11 MED ORDER — PHENYLEPHRINE 80 MCG/ML (10ML) SYRINGE FOR IV PUSH (FOR BLOOD PRESSURE SUPPORT): 80.0000 ug | PREFILLED_SYRINGE | INTRAVENOUS | Status: DC | PRN

## 2023-08-11 MED ORDER — FENTANYL CITRATE (PF) 100 MCG/2ML IJ SOLN
INTRAMUSCULAR | Status: DC | PRN
  Administered 2023-08-11: 100 ug via EPIDURAL

## 2023-08-11 MED ORDER — ONDANSETRON HCL 4 MG/2ML IJ SOLN
INTRAMUSCULAR | Status: DC | PRN
  Administered 2023-08-11: 4 mg via INTRAVENOUS

## 2023-08-11 SURGICAL SUPPLY — 42 items
BENZOIN TINCTURE PRP APPL 2/3 (GAUZE/BANDAGES/DRESSINGS) IMPLANT
CHLORAPREP W/TINT 26 (MISCELLANEOUS) ×2 IMPLANT
CHLORAPREP W/TINT 26ML (MISCELLANEOUS) ×2 IMPLANT
CLAMP UMBILICAL CORD (MISCELLANEOUS) ×2 IMPLANT
CLOTH BEACON ORANGE TIMEOUT ST (SAFETY) ×2 IMPLANT
DERMABOND ADVANCED .7 DNX12 (GAUZE/BANDAGES/DRESSINGS) ×4 IMPLANT
DISSECTOR SURG LIGASURE 21 (MISCELLANEOUS) ×1 IMPLANT
DRSG OPSITE POSTOP 4X10 (GAUZE/BANDAGES/DRESSINGS) ×2 IMPLANT
ELECTRODE REM PT RTRN 9FT ADLT (ELECTROSURGICAL) ×2 IMPLANT
EXTRACTOR VACUUM KIWI (MISCELLANEOUS) ×1 IMPLANT
GAUZE PAD ABD 7.5X8 STRL (GAUZE/BANDAGES/DRESSINGS) IMPLANT
GAUZE SPONGE 4X4 12PLY STRL LF (GAUZE/BANDAGES/DRESSINGS) IMPLANT
GLOVE BIOGEL PI IND STRL 7.0 (GLOVE) ×3 IMPLANT
GLOVE ECLIPSE 7.0 STRL STRAW (GLOVE) ×1 IMPLANT
GLOVE ECLIPSE 7.5 STRL STRAW (GLOVE) ×1 IMPLANT
GLOVE SURG SS PI 7.0 STRL IVOR (GLOVE) ×1 IMPLANT
GOWN STRL REUS W/TWL LRG LVL3 (GOWN DISPOSABLE) ×6 IMPLANT
KIT ABG SYR 3ML LUER SLIP (SYRINGE) IMPLANT
MAT PREVALON FULL STRYKER (MISCELLANEOUS) IMPLANT
NDL HYPO 25X5/8 SAFETYGLIDE (NEEDLE) IMPLANT
NEEDLE HYPO 25X5/8 SAFETYGLIDE (NEEDLE) IMPLANT
NS IRRIG 1000ML POUR BTL (IV SOLUTION) ×2 IMPLANT
PACK C SECTION WH (CUSTOM PROCEDURE TRAY) ×2 IMPLANT
PAD OB MATERNITY 4.3X12.25 (PERSONAL CARE ITEMS) ×2 IMPLANT
PENCIL SMOKE EVAC W/HOLSTER (ELECTROSURGICAL) ×1 IMPLANT
RETRACTOR TRAXI PANNICULUS (MISCELLANEOUS) IMPLANT
RTRCTR C-SECT PINK 25CM LRG (MISCELLANEOUS) ×2 IMPLANT
STRIP CLOSURE SKIN 1/2X4 (GAUZE/BANDAGES/DRESSINGS) IMPLANT
SUT MNCRL 0 VIOLET CTX 36 (SUTURE) ×4 IMPLANT
SUT MNCRL AB 3-0 PS2 27 (SUTURE) ×2 IMPLANT
SUT MNCRL AB 4-0 PS2 18 (SUTURE) ×1 IMPLANT
SUT MON AB 4-0 PS1 27 (SUTURE) ×1 IMPLANT
SUT PLAIN ABS 2-0 54XMFL TIE (SUTURE) ×1 IMPLANT
SUT VIC AB 0 CT1 27XBRD ANBCTR (SUTURE) ×2 IMPLANT
SUT VIC AB 0 CTX36XBRD ANBCTRL (SUTURE) ×1 IMPLANT
SUT VIC AB 2-0 CT1 TAPERPNT 27 (SUTURE) ×2 IMPLANT
SUT VIC AB 4-0 KS 27 (SUTURE) ×1 IMPLANT
SUT VIC AB 4-0 PS2 27 (SUTURE) ×2 IMPLANT
SUTURE PLAIN GUT 2.0 ETHICON (SUTURE) IMPLANT
TOWEL OR 17X24 6PK STRL BLUE (TOWEL DISPOSABLE) ×2 IMPLANT
TRAY FOLEY W/BAG SLVR 14FR LF (SET/KITS/TRAYS/PACK) ×2 IMPLANT
WATER STERILE IRR 1000ML POUR (IV SOLUTION) ×2 IMPLANT

## 2023-08-11 NOTE — Progress Notes (Signed)
 Patient ID: Gina Hurley, female   DOB: Jul 28, 1990, 33 y.o.   MRN: [id number]  Vitals:   08/10/23 2359 08/11/23 0000  BP: 134/73 134/73  Pulse: 82 82  Resp:    Temp:    SpO2:     Pitocin  restarted at 2 mu/min at 2308. SNM to bedside for IUPC placement. Cervix 3 cm dilated, 50% effaced, and posterior on exam. IUPC placed without difficulty. Patient tolerated well. FHR remained Cat 1 throughout and after placement. TOCO showing UI. Will continue to titrate Pitocin  to achieve adequate labor.  Vernell Ruddle, SNM 08/11/23 0009

## 2023-08-11 NOTE — Progress Notes (Signed)
 Labor progress note:   Update from bedside RN -- RN went to check on pt, she is crying out 2/2 significant discomfort. Cx rechecked w marked difference in consistency (now thin) and position of baby -- now more center, has had descent in station from -3 to -1. EFM 140/mod/+10x10 accels/-d. Continue Pitocin  - currently at 6.   Alain Sor, MD OB Fellow, Faculty Practice [facility], Center for [facility]

## 2023-08-11 NOTE — Anesthesia Preprocedure Evaluation (Addendum)
 Anesthesia Evaluation  Patient identified by MRN, date of birth, ID band Patient awake    Reviewed: Allergy & Precautions, NPO status , Patient's Chart, lab work & pertinent test results  History of Anesthesia Complications Negative for: history of anesthetic complications  Airway Mallampati: II  TM Distance: >3 FB Neck ROM: Full    Dental  (+) Poor Dentition, Missing   Pulmonary Patient abstained from smoking., former smoker   Pulmonary exam normal        Cardiovascular hypertension, Normal cardiovascular exam     Neuro/Psych     Bipolar Disorder   negative neurological ROS     GI/Hepatic negative GI ROS, Neg liver ROS,,,  Endo/Other  diabetes, Gestational  Class 3 obesity  Renal/GU negative Renal ROS  negative genitourinary   Musculoskeletal negative musculoskeletal ROS (+)    Abdominal   Peds  Hematology negative hematology ROS (+)   Anesthesia Other Findings Day of surgery medications reviewed with patient.  Reproductive/Obstetrics (+) Pregnancy                              Anesthesia Physical Anesthesia Plan  ASA: 3 and emergent  Anesthesia Plan: Epidural   Post-op Pain Management: Ofirmev  IV (intra-op)*   Induction:   PONV Risk Score and Plan: 3 and Treatment may vary due to age or medical condition, Ondansetron  and Dexamethasone   Airway Management Planned: Natural Airway  Additional Equipment: Fetal Monitoring  Intra-op Plan:   Post-operative Plan:   Informed Consent: I have reviewed the patients History and Physical, chart, labs and discussed the procedure including the risks, benefits and alternatives for the proposed anesthesia with the patient or authorized representative who has indicated his/her understanding and acceptance.       Plan Discussed with: CRNA  Anesthesia Plan Comments:          Anesthesia Quick Evaluation

## 2023-08-11 NOTE — Progress Notes (Signed)
 Labor progress note:  In to recheck pt's cx -- she would like epidural if she is close to 5cm. Cx 4.5-5cm. EFM 150/mod/+a/-d. Pt would like to proceed w IOL, will continue Pitocin  and bedside RN helping to arrange for epidural.  A2GDM: CBGs ok  cont to monitor, EFW AGA  cHTN: BP have been normotensive to mild range, will cont to watch closely   Alain Sor, MD OB Fellow, Faculty Practice [facility], Center for [facility]

## 2023-08-11 NOTE — Progress Notes (Incomplete)
 Patient ID: Gina Hurley, female   DOB: [birth year], 33 y.o.   MRN: [id number]  Vitals:   08/10/23 2359 08/11/23 0000  BP: 134/73 134/73  Pulse: 82 82  Resp:    Temp:    SpO2:     Dilation: 4 Effacement (%): 50 Cervical Position: Posterior Station: -3 Presentation: Vertex Exam by:: Dr FORBES Moats  Pitocin  restarted at 2 mu/min at

## 2023-08-11 NOTE — Progress Notes (Addendum)
 Labor progress note:  In to check on patient. Patient having increased discomfort with contractions, breathing through them. She is frustrated by slow progress with labor and discouraged by the slow change and how different this labor course has been as compared to previous. She inquired about at what point we would recommend a cesarean delivery. I discussed with her that given she is s/p AROM x 27 hours and is on 38 mU/mL and now making change, however is still in latent labor. If she had not made progress, we would consider a failed IOL. Discussed in detail what a primary cesarean delivery would look like for her vs continuing with IOL. We discussed extensively risks, benefits, and alternatives of both options. I offered to recheck her cervix at this time -- she has changed from 3.5/70/-1 to 4/70/-1. She would like to take some time to consider options and will let us  know when she has a decision or if she has more questions.  The risks of surgery were discussed with the patient including but were not limited to: bleeding which may require transfusion or reoperation; infection which may require antibiotics; injury to bowel, bladder, ureters or other surrounding organs; injury to the fetus; need for additional procedures including hysterectomy in the event of a life-threatening hemorrhage; formation of adhesions; placental abnormalities wth subsequent pregnancies; incisional problems; thromboembolic phenomenon and other postoperative/anesthesia complications.   Alain Sor, MD OB Fellow, Faculty Practice [facility], Center for [facility]

## 2023-08-11 NOTE — Progress Notes (Signed)
 Labor Progress Note Gina Hurley is a 33 y.o. [phone] at [redacted]w[redacted]d presented for IOL due to A2GDM  S: Sitting in chair, states contractiosn getting stronger.  O:  BP 125/67   Pulse 83   Temp 97.6 F (36.4 C) (Oral)   Resp 16   Ht 5' 5 (1.651 m)   Wt 112.8 kg   LMP 11/22/2022 (Within Days)   SpO2 100%   BMI 41.39 kg/m  EFM: 140/mod/+a/-d  CVE: Dilation: 3 Effacement (%): 50 Cervical Position: Posterior Station: -3 Presentation: Vertex Exam by:: Vernell CREWS   A&P: 33 y.o. [id number] [redacted]w[redacted]d here for IOL due to A2GDM  #Labor: Progressing slowly. Not in labor. S/p AROM at 1300 yesterday, has made very slow progress. S/p Pit break last night. Now on 28 of Pitocin , ctx beginning to get closer together and more painful. #Pain: Would like to avoid epidural #FWB: Cat I #GBS negative  #A2GDM: CBGs overall have been ok  EFW 2488 - 44%tile at 34 w  Pelvis proven to 4400g  #cHTN: BP ok  PEC labs wnl  #Late latent syphilis: s/p PCN x 3 doses  placenta to path  Alain Sor, MD 9:28 AM

## 2023-08-11 NOTE — Progress Notes (Signed)
 Patient ID: Gina Hurley, female   DOB: 02-06-1990, 33 y.o.   MRN: [id number]  Vitals:   08/11/23 0230 08/11/23 0300  BP: 106/62 109/62  Pulse: 79 81  Resp:    Temp:  98 F (36.7 C)  SpO2:     Patient up walking in hall with support person. Starting to feel more uncomfortable with contractions. Describing as pain in pelvis and rectal pressure. FHR Cat 1. UC q 8 minutes.  Vernell Ruddle, [facility] 08/11/23 [phone]

## 2023-08-11 NOTE — Progress Notes (Signed)
 L&D Note  08/11/2023 - 4:58 PM  33 y.o. H5E6996 [redacted]w[redacted]d. 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/10/2023   Chronic hypertension affecting pregnancy 03/05/2023   BMI 39.0-39.9,adult 03/05/2023   Obesity in pregnancy 03/05/2023   Late prenatal care affecting pregnancy, antepartum 03/05/2023   Supervision of high risk pregnancy, antepartum 02/25/2023   Bipolar 1 disorder (HCC)    Gina Hurley is admitted for IOL for GDMa2   Subjective:  Feeling some UCs, not uncomfortable  Objective:    Current Vital Signs 24h Vital Sign Ranges  T 98.7 F (37.1 C) Temp  Avg: 97.9 F (36.6 C)  Min: 97.6 F (36.4 C)  Max: 98.7 F (37.1 C)  BP 124/70 BP  Min: 106/62  Max: 134/83  HR 89 Pulse  Avg: 80.5  Min: 71  Max: 92  RR 17 Resp  Avg: 17.9  Min: 16  Max: 20  SaO2 100 %   No data recorded       24 Hour I/O Current Shift I/O  Time Ins Outs No intake/output data recorded. No intake/output data recorded.   FHR: 150 baseline, no accel, no decel, mod variability Toco: q2-80m, MVU 80-100 Gen: NAD SVE: 4/70/-1 at 1616 by Dr. Von  Labs:  Recent Labs  Lab 08/09/23 0700  WBC 8.5  HGB 11.1*  HCT 31.9*  PLT 240   Recent Labs  Lab 08/09/23 0700  NA 134*  K 3.9  CL 101  CO2 19*  BUN 7  CREATININE 0.47  CALCIUM  8.7*  PROT 6.2*  BILITOT 0.4  ALKPHOS 110  ALT 8  AST 20  GLUCOSE 84    Medications Current Facility-Administered Medications  Medication Dose Route Frequency Provider Last Rate Last Admin   acetaminophen  (TYLENOL ) tablet 650 mg  650 mg Oral Q4H PRN Cleatus Moccasin, MD       fentaNYL  (SUBLIMAZE ) injection 50-100 mcg   50-100 mcg Intravenous Q1H PRN Cleatus Moccasin, MD   100 mcg at 08/11/23 1428   lactated ringers  infusion 500-1,000 mL  500-1,000 mL Intravenous PRN Cleatus Moccasin, MD       lactated ringers  infusion   Intravenous Continuous Cleatus Moccasin, MD 125 mL/hr at 08/11/23 1454 New Bag at 08/11/23 1454   lidocaine  (PF) (XYLOCAINE ) 1 % injection 30 mL  30 mL Subcutaneous PRN Cleatus Moccasin, MD       ondansetron  (ZOFRAN ) injection 4 mg  4 mg Intravenous Q6H PRN Cleatus Moccasin, MD       oxytocin  (PITOCIN ) IV BOLUS FROM BAG  333 mL Intravenous Once Cleatus Moccasin, MD       oxytocin  (PITOCIN ) IV infusion 30 units in NS 500 mL - Premix  2.5 Units/hr Intravenous Continuous Cleatus Moccasin, MD       oxytocin  (PITOCIN ) IV infusion 30 units in NS 500 mL - Premix  1-40 milli-units/min Intravenous Titrated Leveque, Alyssa, MD 40 mL/hr at 08/11/23 1651 40 milli-units/min at 08/11/23 1651   sodium citrate -citric acid  (ORACIT) solution 30 mL  30 mL Oral Q2H PRN Cleatus Moccasin, MD       terbutaline  (BRETHINE ) injection 0.25 mg  0.25 mg Subcutaneous Once PRN Nicholaus Almarie HERO, MD  Assessment & Plan:  Patient hopefully transitioning to active labor *Labor: Patient desiring a c-section. I told her that I understand her frustration at her prolonged IOL but her cervix did change at the last exam to 4cm from 3cm. I told her that there are risks with c-section especially after labor and that if she desires a c-section now then it would be consider elective and I don't recommend it. I told her I recommend rechecking her at 1730 and if unchanged (no further dilation and/or station not lower) then c-section for arrest of dilation/failed IOL is very reasonable to do. Continue pitocin  Patient is amenable to re-check by Dr. Von at [phone].  *GDMA2: 90s-110s *GBS: negative *H/o syphilis: s/p PCN x 3. Placenta to pathology  *Analgesia: no current needs.   Gina Izell Overcast MD Attending Center for [facility])

## 2023-08-11 NOTE — Anesthesia Procedure Notes (Signed)
 Epidural Patient location during procedure: OB Start time: 08/11/2023 7:16 PM End time: 08/11/2023 7:19 PM  Staffing Anesthesiologist: Paul Lamarr BRAVO, MD Performed: anesthesiologist   Preanesthetic Checklist Completed: patient identified, IV checked, risks and benefits discussed, monitors and equipment checked, pre-op evaluation and timeout performed  Epidural Patient position: sitting Prep: DuraPrep and site prepped and draped Patient monitoring: continuous pulse ox, blood pressure and heart rate Approach: midline Location: L3-L4 Injection technique: LOR air  Needle:  Needle type: Tuohy  Needle gauge: 17 G Needle length: 9 cm Needle insertion depth: 6 cm Catheter type: closed end flexible Catheter size: 19 Gauge Catheter at skin depth: 11 cm Test dose: negative and Other (1% lidocaine )  Assessment Events: blood not aspirated, no cerebrospinal fluid, injection not painful, no injection resistance, no paresthesia and negative IV test  Additional Notes Patient identified. Risks, benefits, and alternatives discussed with patient including but not limited to bleeding, infection, nerve damage, paralysis, failed block, incomplete pain control, headache, blood pressure changes, nausea, vomiting, reactions to medication, itching, and postpartum back pain. Confirmed with bedside nurse the patient's most recent platelet count. Confirmed with patient that they are not currently taking any anticoagulation, have any bleeding history, or any family history of bleeding disorders. Patient expressed understanding and wished to proceed. All questions were answered. Sterile technique was used throughout the entire procedure. Please see nursing notes for vital signs.   Crisp LOR on first pass. Test dose was given through epidural catheter and negative prior to continuing to dose epidural or start infusion. Warning signs of high block given to the patient including shortness of breath,  tingling/numbness in hands, complete motor block, or any concerning symptoms with instructions to call for help. Patient was given instructions on fall risk and not to get out of bed. All questions and concerns addressed with instructions to call with any issues or inadequate analgesia.  Reason for block:procedure for pain

## 2023-08-11 NOTE — Progress Notes (Signed)
 Patient ID: KHAYLEE MCEVOY, female   DOB: 04-18-1990, 33 y.o.   MRN: [id number]  Vitals:   08/11/23 0635 08/11/23 0700  BP: 127/77 123/76  Pulse: 81 85  Resp:    Temp:    SpO2:     Dilation: 3 Effacement (%): 50 Cervical Position: Posterior Station: -3 Presentation: Vertex Exam by:: Vernell, SCNM  Patient sleeping in bed, but woke up when SNM and RN came into room. Not feeling painful contractions. Cervix unchanged from exam shortly before midnight. Pitocin  at 26 mu/min. UC now 4-5 minutes apart. IUPC in place. FHR Cat 1. Plan to continue to titrate Pitocin  to achieve adequate labor.   Vernell Ruddle, [facility] 08/11/23 [phone]

## 2023-08-12 ENCOUNTER — Other Ambulatory Visit: Payer: Self-pay

## 2023-08-12 ENCOUNTER — Encounter (HOSPITAL_COMMUNITY): Payer: Self-pay | Admitting: Obstetrics and Gynecology

## 2023-08-12 DIAGNOSIS — Z9079 Acquired absence of other genital organ(s): Secondary | ICD-10-CM

## 2023-08-12 DIAGNOSIS — Z302 Encounter for sterilization: Secondary | ICD-10-CM

## 2023-08-12 DIAGNOSIS — Z98891 History of uterine scar from previous surgery: Secondary | ICD-10-CM

## 2023-08-12 LAB — CBC
HCT: 28.1 % — ABNORMAL LOW (ref 36.0–46.0)
HCT: 28.3 % — ABNORMAL LOW (ref 36.0–46.0)
Hemoglobin: 9.8 g/dL — ABNORMAL LOW (ref 12.0–15.0)
Hemoglobin: 9.8 g/dL — ABNORMAL LOW (ref 12.0–15.0)
MCH: 30.6 pg (ref 26.0–34.0)
MCH: 30.8 pg (ref 26.0–34.0)
MCHC: 34.9 g/dL (ref 30.0–36.0)
MCHC: 34.6 g/dL (ref 30.0–36.0)
MCV: 87.8 fL (ref 80.0–100.0)
MCV: 89 fL (ref 80.0–100.0)
Platelets: 198 K/uL (ref 150–400)
Platelets: 229 K/uL (ref 150–400)
RBC: 3.2 MIL/uL — ABNORMAL LOW (ref 3.87–5.11)
RBC: 3.18 MIL/uL — ABNORMAL LOW (ref 3.87–5.11)
RDW: 12.8 % (ref 11.5–15.5)
RDW: 13 % (ref 11.5–15.5)
WBC: 18.2 K/uL — ABNORMAL HIGH (ref 4.0–10.5)
WBC: 19.5 K/uL — ABNORMAL HIGH (ref 4.0–10.5)
nRBC: 0 % (ref 0.0–0.2)
nRBC: 0 % (ref 0.0–0.2)

## 2023-08-12 LAB — GLUCOSE, CAPILLARY: Glucose-Capillary: 121 mg/dL — ABNORMAL HIGH (ref 70–99)

## 2023-08-12 MED ORDER — MENTHOL 3 MG MT LOZG: 1.0000 | LOZENGE | OROMUCOSAL | Status: DC | PRN

## 2023-08-12 MED ORDER — WITCH HAZEL-GLYCERIN EX PADS: 1.0000 | MEDICATED_PAD | CUTANEOUS | Status: DC | PRN

## 2023-08-12 MED ORDER — SODIUM CHLORIDE 0.9% FLUSH: 3.0000 mL | INTRAVENOUS | Status: DC | PRN

## 2023-08-12 MED ORDER — DIPHENHYDRAMINE HCL 25 MG PO CAPS: 25.0000 mg | ORAL_CAPSULE | ORAL | Status: DC | PRN

## 2023-08-12 MED ORDER — MORPHINE SULFATE (PF) 0.5 MG/ML IJ SOLN
INTRAMUSCULAR | Status: DC | PRN
  Administered 2023-08-11: 3 mg via EPIDURAL

## 2023-08-12 MED ORDER — NALOXONE HCL 0.4 MG/ML IJ SOLN: 0.4000 mg | INTRAMUSCULAR | Status: DC | PRN

## 2023-08-12 MED ORDER — ENOXAPARIN SODIUM 60 MG/0.6ML IJ SOSY
60.0000 mg | PREFILLED_SYRINGE | INTRAMUSCULAR | Status: DC
  Administered 2023-08-12: 60 mg via SUBCUTANEOUS
  Filled 2023-08-12: qty 0.6

## 2023-08-12 MED ORDER — DIPHENHYDRAMINE HCL 25 MG PO CAPS
25.0000 mg | ORAL_CAPSULE | Freq: Four times a day (QID) | ORAL | Status: DC | PRN
Start: 2023-08-12 — End: 2023-08-13

## 2023-08-12 MED ORDER — SODIUM CHLORIDE 0.9 % IR SOLN
Status: DC | PRN
  Administered 2023-08-12: 1

## 2023-08-12 MED ORDER — FUROSEMIDE 20 MG PO TABS
20.0000 mg | ORAL_TABLET | Freq: Every day | ORAL | Status: DC
  Administered 2023-08-12 – 2023-08-13 (×2): 20 mg via ORAL
  Filled 2023-08-12 (×2): qty 1

## 2023-08-12 MED ORDER — DIPHENHYDRAMINE HCL 50 MG/ML IJ SOLN
12.5000 mg | INTRAMUSCULAR | Status: DC | PRN
Start: 2023-08-12 — End: 2023-08-13

## 2023-08-12 MED ORDER — NALOXONE HCL 4 MG/10ML IJ SOLN: 1.0000 ug/kg/h | INTRAVENOUS | Status: DC | PRN

## 2023-08-12 MED ORDER — POTASSIUM CHLORIDE CRYS ER 20 MEQ PO TBCR
20.0000 meq | EXTENDED_RELEASE_TABLET | Freq: Every day | ORAL | Status: DC
  Administered 2023-08-12 – 2023-08-13 (×2): 20 meq via ORAL
  Filled 2023-08-12 (×2): qty 1

## 2023-08-12 MED ORDER — SIMETHICONE 80 MG PO CHEW
80.0000 mg | CHEWABLE_TABLET | ORAL | Status: DC | PRN
Start: 2023-08-12 — End: 2023-08-13

## 2023-08-12 MED ORDER — KETOROLAC TROMETHAMINE 30 MG/ML IJ SOLN
30.0000 mg | Freq: Four times a day (QID) | INTRAMUSCULAR | Status: AC
  Administered 2023-08-12 – 2023-08-13 (×4): 30 mg via INTRAVENOUS
  Filled 2023-08-12 (×4): qty 1

## 2023-08-12 MED ORDER — DROPERIDOL 2.5 MG/ML IJ SOLN: 0.6250 mg | Freq: Once | INTRAMUSCULAR | Status: DC | PRN

## 2023-08-12 MED ORDER — PRENATAL MULTIVITAMIN CH
1.0000 | ORAL_TABLET | Freq: Every day | ORAL | Status: DC
  Administered 2023-08-12 – 2023-08-13 (×2): 1 via ORAL
  Filled 2023-08-12 (×2): qty 1

## 2023-08-12 MED ORDER — KETOROLAC TROMETHAMINE 30 MG/ML IJ SOLN: 30.0000 mg | Freq: Four times a day (QID) | INTRAMUSCULAR | Status: DC | PRN

## 2023-08-12 MED ORDER — KETOROLAC TROMETHAMINE 30 MG/ML IJ SOLN
INTRAMUSCULAR | Status: AC
  Filled 2023-08-12: qty 1

## 2023-08-12 MED ORDER — OXYCODONE HCL 5 MG PO TABS
5.0000 mg | ORAL_TABLET | ORAL | Status: DC | PRN
  Administered 2023-08-13: 10 mg via ORAL
  Filled 2023-08-12: qty 2

## 2023-08-12 MED ORDER — HYDROMORPHONE HCL 1 MG/ML IJ SOLN: 0.2000 mg | INTRAMUSCULAR | Status: DC | PRN

## 2023-08-12 MED ORDER — LACTATED RINGERS IV BOLUS
1000.0000 mL | Freq: Once | INTRAVENOUS | Status: AC
  Administered 2023-08-12: 1000 mL via INTRAVENOUS

## 2023-08-12 MED ORDER — IBUPROFEN 600 MG PO TABS
600.0000 mg | ORAL_TABLET | Freq: Four times a day (QID) | ORAL | Status: DC
  Administered 2023-08-13 (×2): 600 mg via ORAL
  Filled 2023-08-12 (×2): qty 1

## 2023-08-12 MED ORDER — ACETAMINOPHEN 500 MG PO TABS: 1000.0000 mg | ORAL_TABLET | Freq: Four times a day (QID) | ORAL | Status: DC

## 2023-08-12 MED ORDER — COCONUT OIL OIL: 1.0000 | TOPICAL_OIL | Status: DC | PRN

## 2023-08-12 MED ORDER — OXYTOCIN-SODIUM CHLORIDE 30-0.9 UT/500ML-% IV SOLN
INTRAVENOUS | Status: DC | PRN
Start: 2023-08-11 — End: 2023-08-12
  Administered 2023-08-11: 30 [IU] via INTRAVENOUS

## 2023-08-12 MED ORDER — SODIUM CHLORIDE 0.9 % IV SOLN
INTRAVENOUS | Status: AC
  Filled 2023-08-12: qty 5

## 2023-08-12 MED ORDER — OXYTOCIN-SODIUM CHLORIDE 30-0.9 UT/500ML-% IV SOLN: 2.5000 [IU]/h | INTRAVENOUS | Status: AC

## 2023-08-12 MED ORDER — DEXAMETHASONE SODIUM PHOSPHATE 4 MG/ML IJ SOLN
INTRAMUSCULAR | Status: DC | PRN
  Administered 2023-08-11: 8 mg via INTRAVENOUS

## 2023-08-12 MED ORDER — DEXAMETHASONE SODIUM PHOSPHATE 4 MG/ML IJ SOLN
INTRAMUSCULAR | Status: AC
  Filled 2023-08-12: qty 2

## 2023-08-12 MED ORDER — SENNOSIDES-DOCUSATE SODIUM 8.6-50 MG PO TABS
2.0000 | ORAL_TABLET | Freq: Every day | ORAL | Status: DC
  Administered 2023-08-13: 2 via ORAL
  Filled 2023-08-12: qty 2

## 2023-08-12 MED ORDER — ZOLPIDEM TARTRATE 5 MG PO TABS
5.0000 mg | ORAL_TABLET | Freq: Every evening | ORAL | Status: DC | PRN
Start: 2023-08-12 — End: 2023-08-13

## 2023-08-12 MED ORDER — OXYTOCIN-SODIUM CHLORIDE 30-0.9 UT/500ML-% IV SOLN
INTRAVENOUS | Status: AC
  Filled 2023-08-12: qty 500

## 2023-08-12 MED ORDER — DIBUCAINE (PERIANAL) 1 % EX OINT
1.0000 | TOPICAL_OINTMENT | CUTANEOUS | Status: DC | PRN
Start: 2023-08-12 — End: 2023-08-13

## 2023-08-12 MED ORDER — ONDANSETRON HCL 4 MG/2ML IJ SOLN: 4.0000 mg | Freq: Three times a day (TID) | INTRAMUSCULAR | Status: DC | PRN

## 2023-08-12 MED ORDER — FENTANYL CITRATE (PF) 100 MCG/2ML IJ SOLN: 25.0000 ug | INTRAMUSCULAR | Status: DC | PRN

## 2023-08-12 MED ORDER — SODIUM CHLORIDE 0.9 % IV SOLN: INTRAVENOUS | Status: DC | PRN

## 2023-08-12 MED ORDER — SIMETHICONE 80 MG PO CHEW
80.0000 mg | CHEWABLE_TABLET | Freq: Three times a day (TID) | ORAL | Status: DC
  Administered 2023-08-12 – 2023-08-13 (×5): 80 mg via ORAL
  Filled 2023-08-12 (×5): qty 1

## 2023-08-12 MED ORDER — KETOROLAC TROMETHAMINE 30 MG/ML IJ SOLN
30.0000 mg | Freq: Once | INTRAMUSCULAR | Status: AC
  Administered 2023-08-12: 30 mg via INTRAVENOUS

## 2023-08-12 MED ORDER — TRANEXAMIC ACID-NACL 1000-0.7 MG/100ML-% IV SOLN
INTRAVENOUS | Status: AC
  Filled 2023-08-12: qty 100

## 2023-08-12 MED ORDER — ACETAMINOPHEN 500 MG PO TABS
1000.0000 mg | ORAL_TABLET | Freq: Four times a day (QID) | ORAL | Status: DC
  Administered 2023-08-12 – 2023-08-13 (×6): 1000 mg via ORAL
  Filled 2023-08-12 (×6): qty 2

## 2023-08-12 MED ORDER — ONDANSETRON HCL 4 MG/2ML IJ SOLN
INTRAMUSCULAR | Status: AC
  Filled 2023-08-12: qty 2

## 2023-08-12 NOTE — Progress Notes (Addendum)
 Post Op Day 1 Subjective:  Gina Hurley is a 33 y.o. H5E5995 [redacted]w[redacted]d s/p LTCS/BS.  No acute events overnight.  Pt denies problems with voiding or po intake. Has not yet been ambulating. She denies nausea or vomiting.  Pain is well controlled.  She has had flatus.  Lochia Minimal. Reports light spotting of blood.  Plan for birth control is bilateral tubal ligation. Method of Feeding: formula   Objective: Blood pressure 111/71, pulse 80, temperature 99 F (37.2 C), temperature source Oral, resp. rate 17, height 5' 5 (1.651 m), weight 112.8 kg, last menstrual period 11/22/2022, SpO2 95%, unknown if currently breastfeeding.  Physical Exam:  General: alert, cooperative and no distress Lochia:  Chest: normal WOB Heart: Regular rate Abdomen: +BS, soft, no TTP (appropriate) Uterine Fundus: firm, just below umbilicus  DVT Evaluation: No evidence of DVT seen on physical exam. Extremities: no LE edema  Recent Labs    08/12/23 0143 08/12/23 0423  HGB 9.8* 9.8*  HCT 28.1* 28.3*    Assessment/Plan:  ASSESSMENT: Gina Hurley is a 33 y.o. H5E5995 [redacted]w[redacted]d s/p LTCS/BS  Plan for discharge tomorrow Continue routine PP care Breastfeeding support PRN PO ferrous sulfate 325 every other day  This is a Psychologist, occupational Note.     LOS: 3 days   Novak Stgermaine J, Medical Student 08/12/2023, 6:05 AM

## 2023-08-12 NOTE — Transfer of Care (Signed)
 Immediate Anesthesia Transfer of Care Note  Patient: Gina Hurley  Procedure(s) Performed: CESAREAN SECTION, WITH BILATERAL TUBAL LIGATION  Patient Location: PACU  Anesthesia Type:Epidural  Level of Consciousness: awake, alert , and oriented  Airway & Oxygen Therapy: Patient Spontanous Breathing  Post-op Assessment: Report given to RN and Post -op Vital signs reviewed and stable  Post vital signs: Reviewed and stable  Last Vitals:  Vitals Value Taken Time  BP 111/71 08/12/23 03:25  Temp 37.2 C 08/12/23 03:25  Pulse 80 08/12/23 03:25  Resp 17 08/12/23 03:25  SpO2 95 % 08/12/23 03:25    Last Pain:  Vitals:   08/12/23 0521  TempSrc:   PainSc: 6       Patients Stated Pain Goal: 0 (08/10/23 0525)  Complications: No notable events documented.

## 2023-08-12 NOTE — Clinical Social Work Maternal (Signed)
 CLINICAL SOCIAL WORK MATERNAL/CHILD NOTE  Patient Details  Name: Gina Hurley MRN: [id number] Date of Birth: [birth year]  Date:  08/12/2023  Clinical Social Worker Initiating Note:  Rosina Molt Date/Time: Initiated:  08/12/23/1532     Child's Name:  Gina Hurley   Biological Parents:  Mother, Father Thermon Hummer 10-21-1988 Tashika Goodin 01-16-92)   Need for Interpreter:  None   Reason for Referral:  Behavioral Health Concerns   Address:  [address] [place] [address]    Phone number:  [phone] (home)     Additional phone number:    Household Members/Support Persons (HM/SP):   Household Member/Support Person 1, Household Member/Support Person 2, Household Member/Support Person 3   HM/SP Name Relationship DOB or Age  HM/SP -1 Gina Hurley 09-28-2012  HM/SP -2 Gina Hurley Daughter 06-03-2010  HM/SP -3 Gina Hurley 03-23-2014  HM/SP -4        HM/SP -5        HM/SP -6        HM/SP -7        HM/SP -8          Natural Supports (not living in the home):  Spouse/significant other, Immediate Family   Professional Supports: None   Employment: Unemployed   Type of Work:     Education:  9 to 11 years   Homebound arranged:    Surveyor, quantity Resources:  Medicaid   Other Resources:  Sales executive  , [employer]   Cultural/Religious Considerations Which May Impact Care:    Strengths:  Ability to meet basic needs  , Home prepared for child  , Merchandiser, retail, Understanding of illness   Psychotropic Medications:         Pediatrician:    Armed forces operational officer area  Pediatrician List:   [facility]  [place]    [place]    [facility]      Pediatrician Fax Number:    Risk Factors/Current Problems:  Mental Health Concerns     Cognitive State:  Able to Concentrate  , Alert  , Insightful  , Goal Oriented  , Linear Thinking     Mood/Affect:  Comfortable  , Calm  , Interested  ,  Relaxed     CSW Assessment: CSW received a consult for Bipolar; and met MOB at bedside to complete a full psychosocial assessment. CSW entered the room, introduced herself and explained the reason for the visit. MOB presented bonding/holding the infant while laying in the bed. MOB presented as calm, was agreeable to consult and remained engaged throughout encounter.   CSW congratulated MOB on the delivery of infant and asked how she was feeling currently. MOB reported fine; however she reported being upset due to FOB being turned down by birth registry to sign the birth certificate. MOB reported they were unaware that a paper copy of the ID was not acceptable and they needed a physical copy. CSW validated MOB's feelings and reported that FOB could sign the birth certificate at a later day once they have obtained the physical copy of the ID; MOB was understanding.  CSW collected MOB's demographic information and inquired about mental health history. MOB reported being diagnosed with Bipolar, ADHD and depression during her teenage years. MOB reported her Bipolar as Maniac and depressive with her last episode being 13 years ago. MOB reported participating in therapy and being prescribed medication in the past and currently she declined participating  in any supportive resources. CSW asked MOB how does she cop with her mental health. MOB reported coping strategies which included taking walks, getting out the house and napping. MOB declined needing any mental health resources at this time and denied PPD after her pregnancies. CSW provided education regarding the baby blues period vs. perinatal mood disorders, discussed treatment and gave resources for mental health follow up if concerns arise.  CSW recommends self-evaluation during the postpartum time period using the New Mom Checklist from Postpartum Progress and encouraged MOB to contact a medical professional if symptoms are noted at any time. CSW assessed for  safety with MOB SI/HI/DV;MOB denied all.  CSW asked MOB does she receive support resources; MOB said yes(WIC and food stamps). MOB reported having all essential items for the infant including a carseat, bassinet and crib for safe sleeping. CSW provided review of Sudden Infant Death Syndrome (SIDS) precautions.    CSW Plan/Description:  CSW identifies no further need for intervention and no barriers to discharge at this time.     Rosina MARLA Molt, LCSW 08/12/2023, 3:36 PM  Rosina Molt, [facility] Clinical Social Worker [phone]

## 2023-08-12 NOTE — Anesthesia Postprocedure Evaluation (Signed)
 Anesthesia Post Note  Patient: Gina Hurley  Procedure(s) Performed: CESAREAN SECTION, WITH BILATERAL TUBAL LIGATION     Patient location during evaluation: PACU Anesthesia Type: Epidural Level of consciousness: awake and alert Pain management: pain level controlled Vital Signs Assessment: post-procedure vital signs reviewed and stable Respiratory status: spontaneous breathing, nonlabored ventilation and respiratory function stable Cardiovascular status: blood pressure returned to baseline Postop Assessment: epidural receding, no apparent nausea or vomiting, no headache and no backache Anesthetic complications: no   No notable events documented.  Last Vitals:  Vitals:   08/12/23 0100 08/12/23 0115  BP: 120/68 121/69  Pulse: 98 90  Resp: (!) 27 19  Temp:  36.8 C  SpO2: 97% 96%    Last Pain:  Vitals:   08/12/23 0115  TempSrc: Oral  PainSc: 0-No pain   Pain Goal: Patients Stated Pain Goal: 0 (08/10/23 0525)  LLE Motor Response: Purposeful movement (08/12/23 0115) LLE Sensation: Tingling (08/12/23 0115) RLE Motor Response: Purposeful movement (08/12/23 0115) RLE Sensation: Tingling (08/12/23 0115)     Epidural/Spinal Function Cutaneous sensation: Able to Wiggle Toes (08/12/23 0115), Patient able to flex knees: Yes (08/12/23 0115), Patient able to lift hips off bed: No (08/12/23 0115), Back pain beyond tenderness at insertion site: No (08/12/23 0115), Progressively worsening motor and/or sensory loss: No (08/12/23 0115), Bowel and/or bladder incontinence post epidural: No (08/12/23 0115)  Vertell Row

## 2023-08-12 NOTE — Op Note (Signed)
 Gina Hurley PROCEDURE DATE: 08/12/2023  PREOPERATIVE DIAGNOSES: Intrauterine pregnancy at [redacted]w[redacted]d weeks gestation; failed induction, request for permanent sterilization/undesired fertility  POSTOPERATIVE DIAGNOSES: The same  PROCEDURE: Primary Low Transverse Cesarean Section, Bilateral Salpingectomy  SURGEON:  Dr. Kieth Carolin  ASSISTANT:  Dr. Almarie Moats  ANESTHESIOLOGY TEAM: Anesthesiologist: Paul Lamarr BRAVO, MD  INDICATIONS: Gina Hurley is a 33 y.o. [phone] at [redacted]w[redacted]d here for cesarean section secondary to the indications listed under preoperative diagnoses; please see preoperative note for further details.  The risks of surgery were discussed with the patient including but were not limited to: bleeding which may require transfusion or reoperation; infection which may require antibiotics; injury to bowel, bladder, ureters or other surrounding organs; injury to the fetus; need for additional procedures including hysterectomy in the event of a life-threatening hemorrhage; formation of adhesions; placental abnormalities wth subsequent pregnancies; incisional problems; thromboembolic phenomenon and other postoperative/anesthesia complications.  The patient concurred with the proposed plan, giving informed written consent for the procedure.    FINDINGS:  Viable female infant with hand/arm presenting.  Apgars 9 and 9.  Amniotic fluid: clear.  Intact placenta, three vessel cord.  Normal uterus and ovaries bilaterally. Filmy adhesions at fimbriated end of the left fallopian tube  ANESTHESIA: epidural INTRAVENOUS FLUIDS: 1300 ml   ESTIMATED BLOOD LOSS: 925 ml URINE OUTPUT:  100 ml SPECIMENS: Placenta sent to pathology  COMPLICATIONS: None immediate  PROCEDURE IN DETAIL:  The patient preoperatively received intravenous antibiotics and had sequential compression devices applied to her lower extremities.  She was then taken to the operating room where the epidural was dosed up to a  surgical level and found to be adequate. She was then placed in a dorsal supine position with a leftward tilt, and prepped and draped in a sterile manner.  A foley catheter was  was already in place.  After an adequate timeout was performed, a Pfannenstiel skin incision was made with scalpel and carried through to the underlying layer of fascia. The fascia was incised in the midline, and this incision was extended bluntly. The rectus muscles were separated in the midline and the peritoneum was entered bluntly.   The Alexis self-retaining retractor was introduced into the abdominal cavity.  Attention was turned to the lower uterine segment where a low transverse hysterotomy was made with a scalpel and extended bilaterally bluntly.  The infant was successfully delivered, the cord was clamped and cut after one minute, and the infant was handed over to the awaiting neonatology team. Uterine massage was then administered, and the placenta delivered intact with a three-vessel cord. The uterus was then exteriorized and cleared of clots and debris.  The hysterotomy was closed with 0 Vicryl in a running locked fashion.   Attention was turned to the fallopian tubes. The Ligasure device was used to separate the fallopian tube along the mesosalpinx from the fimbriated end to the cornua bilaterally. Operative sites inspected and hemostatic.  The pelvis was cleared of all clot and debris. The uterus was returned to the abdomen. Hemostasis was confirmed on all surfaces.  The retractor was removed.  The peritoneum was closed with a 2-0 Vicryl running stitch. The fascia was then closed using 0 Vicryl in a running fashion.  The subcutaneous layer was irrigated, and was found to be hemostatic, any areas of bleeding were cauterized with the bovie, and was reapproximated with 2-0 plain gut in a running fashion. The skin was closed with a 4-0 vicryl subcuticular stitch. The patient tolerated  the procedure well. Sponge, instrument  and needle counts were correct x 3.  She was taken to the recovery room in stable condition.   Kieth Carolin, MD Obstetrician & Gynecologist, [facility] for [employer], [facility]

## 2023-08-12 NOTE — Discharge Summary (Signed)
 Postpartum Discharge Summary  Date of Service updated***     Patient Name: Gina Hurley DOB: [birth year] MRN: [id number]  Date of admission: 08/09/2023 Delivery date:08/11/2023 Delivering provider: ERIK KIETH BROCKS Date of discharge: 08/12/2023  Admitting diagnosis: Pregnancy [Z34.90] Intrauterine pregnancy: [redacted]w[redacted]d     Secondary diagnosis:  Active Problems:   Supervision of high risk pregnancy, antepartum   Bipolar 1 disorder (HCC)   Chronic hypertension affecting pregnancy   BMI 39.0-39.9,adult   Late prenatal care affecting pregnancy, antepartum   Late latent syphilis   Chlamydia trachomatis infection in pregnancy in second trimester   UTI in pregnancy, antepartum, second trimester   High grade squamous intraepithelial lesion (HGSIL), grade 3 CIN, on biopsy of cervix   GDM, class A2   Pregnancy   S/P cesarean section   Request for sterilization  Additional problems: None    Discharge diagnosis: Term Pregnancy Delivered, CHTN, and GDM A2                                              Post partum procedures: Bilateral salpingectomy Augmentation: AROM, Pitocin , Cytotec , and IP Foley Complications: None  Hospital course: Induction of Labor With Cesarean Section  & Bilateral Salpingectomy 33 y.o. yo [id number] at [redacted]w[redacted]d was admitted to the hospital 08/09/2023 for induction of labor. Patient had a labor course significant for failed induction of labor and category II fetal heart rate tracing. The patient went for cesarean section due to failed IOL. Delivery details are as follows: Membrane Rupture Time/Date: 1:19 PM,08/10/2023  Delivery Method:C-Section, Low Transverse Operative Delivery:N/A Details of operation can be found in separate operative Note.  Patient had a postpartum course complicated by: cHTN - no meds***. She received lasix /K x 5d PP  . She is ambulating, tolerating a regular diet, passing flatus, and urinating well.  Patient is discharged home in stable condition  on 08/12/23.      Newborn Data: Birth date:08/11/2023 Birth time:11:42 PM Gender:Female Living status:Living Apgars:9 ,9  Weight:3300 g                               Magnesium  Sulfate received: No BMZ received: No Rhophylac:N/A MMR:N/A T-DaP:Given prenatally Flu: N/A RSV Vaccine received: No Transfusion:No***  Immunizations received: Immunization History  Administered Date(s) Administered   Pneumococcal Polysaccharide-23 09/30/2012   Tdap 08/12/2012, 05/23/2023    Physical exam  Vitals:   08/11/23 2136 08/11/23 2200 08/11/23 2204 08/11/23 2300  BP:  124/78 124/78 127/72  Pulse:  99 99 (!) 102  Resp:      Temp: 98.8 F (37.1 C)     TempSrc: Oral     SpO2:      Weight:      Height:       General: {Exam; general:21111117} Lochia: {Desc; appropriate/inappropriate:30686::appropriate} Uterine Fundus: {Desc; firm/soft:30687} Incision: {Exam; incision:21111123} DVT Evaluation: {Exam; dvt:2111122} Labs: Lab Results  Component Value Date   WBC 16.9 (H) 08/11/2023   HGB 11.8 (L) 08/11/2023   HCT 34.3 (L) 08/11/2023   MCV 88.9 08/11/2023   PLT 221 08/11/2023      Latest Ref Rng & Units 08/09/2023    7:00 AM  CMP  Glucose 70 - 99 mg/dL 84   BUN 6 - 20 mg/dL 7   Creatinine 9.55 - 8.99 mg/dL 9.52  Sodium 135 - 145 mmol/L 134   Potassium 3.5 - 5.1 mmol/L 3.9   Chloride 98 - 111 mmol/L 101   CO2 22 - 32 mmol/L 19   Calcium  8.9 - 10.3 mg/dL 8.7   Total Protein 6.5 - 8.1 g/dL 6.2   Total Bilirubin 0.0 - 1.2 mg/dL 0.4   Alkaline Phos 38 - 126 U/L 110   AST 15 - 41 U/L 20   ALT 0 - 44 U/L 8    Edinburgh Score:     No data to display         No data recorded  After visit meds:  Allergies as of 08/12/2023       Reactions   Shellfish Allergy Anaphylaxis, Swelling   Iodine Rash     Med Rec must be completed prior to using this [facility]***        Discharge home in stable condition Infant Feeding: Formula Infant Disposition:{CHL IP OB HOME WITH  FNUYZM:[id number]} Discharge instruction: per After Visit Summary and Postpartum booklet. Activity: Advance as tolerated. Pelvic rest for 6 weeks.  Diet: routine diet Future Appointments: Future Appointments  Date Time Provider Department Center  08/15/2023  9:00 AM [facility] NURSE [facility]   Follow up Visit:  Sent by Erik 7/22 Please schedule this patient for a In person postpartum visit in 6 weeks with the following provider: Any provider. Additional Postpartum F/U:2 hour GTT/LEEP in 6 weeks and Incision/BP check 1 week  High risk pregnancy complicated by: GDM, HTN, late latent syphilis and BMI 41 Delivery mode:  C-Section, Low Transverse Anticipated Birth Control:  s/p bilateral salpingectomy   08/12/2023 Kieth JAYSON Erik, MD

## 2023-08-13 ENCOUNTER — Other Ambulatory Visit (HOSPITAL_COMMUNITY): Payer: Self-pay

## 2023-08-13 MED ORDER — FERROUS GLUCONATE 324 (38 FE) MG PO TABS
324.0000 mg | ORAL_TABLET | ORAL | 0 refills | Status: AC
  Filled 2023-08-13: qty 45, 90d supply, fill #0

## 2023-08-13 MED ORDER — OXYCODONE HCL 5 MG PO TABS
5.0000 mg | ORAL_TABLET | ORAL | 0 refills | Status: DC | PRN
  Filled 2023-08-13: qty 20, 2d supply, fill #0

## 2023-08-13 MED ORDER — ACETAMINOPHEN 500 MG PO TABS
1000.0000 mg | ORAL_TABLET | Freq: Three times a day (TID) | ORAL | 0 refills | Status: DC | PRN
  Filled 2023-08-13: qty 60, 10d supply, fill #0

## 2023-08-13 MED ORDER — POTASSIUM CHLORIDE CRYS ER 20 MEQ PO TBCR
20.0000 meq | EXTENDED_RELEASE_TABLET | Freq: Every day | ORAL | 0 refills | Status: DC
  Filled 2023-08-13: qty 4, 4d supply, fill #0

## 2023-08-13 MED ORDER — SENNA 8.6 MG PO TABS
2.0000 | ORAL_TABLET | Freq: Every evening | ORAL | 0 refills | Status: DC | PRN
  Filled 2023-08-13: qty 60, 30d supply, fill #0

## 2023-08-13 MED ORDER — IBUPROFEN 800 MG PO TABS
800.0000 mg | ORAL_TABLET | Freq: Three times a day (TID) | ORAL | 0 refills | Status: DC | PRN
  Filled 2023-08-13: qty 60, 20d supply, fill #0

## 2023-08-13 MED ORDER — FUROSEMIDE 20 MG PO TABS
20.0000 mg | ORAL_TABLET | Freq: Every day | ORAL | 0 refills | Status: DC
  Filled 2023-08-13: qty 4, 4d supply, fill #0

## 2023-08-13 MED ORDER — FERROUS GLUCONATE 324 (38 FE) MG PO TABS
324.0000 mg | ORAL_TABLET | ORAL | Status: DC
  Administered 2023-08-13: 324 mg via ORAL
  Filled 2023-08-13: qty 1

## 2023-08-13 NOTE — Patient Instructions (Signed)

## 2023-08-15 ENCOUNTER — Ambulatory Visit

## 2023-08-18 LAB — SURGICAL PATHOLOGY

## 2023-08-20 ENCOUNTER — Encounter: Payer: Self-pay | Admitting: Family Medicine

## 2023-08-25 ENCOUNTER — Ambulatory Visit: Admitting: *Deleted

## 2023-08-25 ENCOUNTER — Other Ambulatory Visit: Payer: Self-pay

## 2023-08-25 VITALS — BP 131/84 | HR 79 | Ht 65.0 in | Wt 225.9 lb

## 2023-08-25 DIAGNOSIS — Z8632 Personal history of gestational diabetes: Secondary | ICD-10-CM

## 2023-08-25 NOTE — Progress Notes (Signed)
 Here for wound check s/p C/s 08/11/23 for failed IOL for GDM, CHTN. BP wnl. Wound CDI with pea sized pink nodule on right side of incision. Discussed with Dr. Herchel and advised patient is normal and will gradually go away. Reviewed wound care and signs of pre-eclampsia. Added postpartum GTT appointment on same day as postpartum visit and reviewed appointments with patient.  Rock Skip PEAK

## 2023-08-28 ENCOUNTER — Encounter: Admitting: Obstetrics & Gynecology

## 2023-09-25 ENCOUNTER — Other Ambulatory Visit

## 2023-09-25 ENCOUNTER — Encounter: Payer: Self-pay | Admitting: Obstetrics and Gynecology

## 2023-09-25 ENCOUNTER — Other Ambulatory Visit: Payer: Self-pay

## 2023-09-25 ENCOUNTER — Ambulatory Visit: Admitting: Obstetrics and Gynecology

## 2023-09-25 DIAGNOSIS — Z23 Encounter for immunization: Secondary | ICD-10-CM

## 2023-09-25 DIAGNOSIS — Z8632 Personal history of gestational diabetes: Secondary | ICD-10-CM

## 2023-09-25 NOTE — Addendum Note (Signed)
 Addended byBETHA BARTHOLOMEW BARMAN on: 09/25/2023 09:27 AM   Modules accepted: Orders

## 2023-09-25 NOTE — Progress Notes (Signed)
 Post Partum Visit Note  Gina Hurley is a 33 y.o. [phone] female who presents for a postpartum visit. She is 6 weeks postpartum following a primary cesarean section.  I have fully reviewed the prenatal and intrapartum course. The delivery was at 37/3 gestational weeks.  Anesthesia: epidural. Postpartum course has been uncomplicated. Baby is doing well. Baby is feeding by bottle - Similac Advance. Bleeding no bleeding. Bowel function is normal. Bladder function is normal. Patient is not sexually active. Contraception method is tubal ligation. Postpartum depression screening: negative.   The pregnancy intention screening data noted above was reviewed. Potential methods of contraception were discussed. The patient elected to proceed with tubal ligation at c section.   Edinburgh Postnatal Depression Scale - 09/25/23 0842       Edinburgh Postnatal Depression Scale:  In the Past 7 Days   I have been able to laugh and see the funny side of things. 0    I have looked forward with enjoyment to things. 0    I have blamed myself unnecessarily when things went wrong. 0    I have been anxious or worried for no good reason. 0    I have felt scared or panicky for no good reason. 0    Things have been getting on top of me. 0    I have been so unhappy that I have had difficulty sleeping. 0    I have felt sad or miserable. 0    I have been so unhappy that I have been crying. 0    The thought of harming myself has occurred to me. 0    Edinburgh Postnatal Depression Scale Total 0          Health Maintenance Due  Topic Date Due   COVID-19 Vaccine (1) Never done   Hepatitis B Vaccines 19-59 Average Risk (1 of 3 - 19+ 3-dose series) Never done   Pneumococcal Vaccine (2 of 2 - PCV) 09/30/2013   HPV VACCINES (1 - Risk 3-dose SCDM series) Never done   INFLUENZA VACCINE  Never done    The following portions of the patient's history were reviewed and updated as appropriate: allergies, current  medications, past family history, past medical history, past social history, past surgical history, and problem list.  Review of Systems Pertinent items are noted in HPI.  Objective:  BP 124/75   Pulse 86   Ht 5' 5 (1.651 m)   Wt 228 lb 1.6 oz (103.5 kg)   LMP 11/22/2022 (Within Days)   Breastfeeding No   BMI 37.96 kg/m    General:  alert, cooperative, no distress, and moderately obese   Breasts:  not indicated  Lungs: clear to auscultation bilaterally  Heart:  regular rate and rhythm  Abdomen: soft, non-tender; bowel sounds normal; no masses,  no organomegaly   Wound well approximated incision, C/D/I  GU exam:  not indicated       Assessment:    Encounter for postpartum care  normal postpartum exam.   Plan:   Essential components of care per ACOG recommendations:  1.  Mood and well being: Patient with negative depression screening today. Reviewed local resources for support.  - Patient tobacco use? Yes. Patient desires to quit? Yes.Discussed reduction and cessation and higher likelihood of postpartum relapse  - hx of drug use? No.    2. Infant care and feeding:  -Patient currently breastmilk feeding? No.  -Social determinants of health (SDOH) reviewed in EPIC. No concerns.  3. Sexuality, contraception and birth spacing - Patient does not want a pregnancy in the next year.  Desired family size is 4 children.  - Reviewed reproductive life planning. Reviewed contraceptive methods based on pt preferences and effectiveness.  Patient desired Female Sterilization today.   - Discussed birth spacing of 18 months  4. Sleep and fatigue -Encouraged family/partner/community support of 4 hrs of uninterrupted sleep to help with mood and fatigue  5. Physical Recovery  - Discussed patients delivery and complications. She describes her labor as good. - Patient had a cesarean section 2/2 failked IOL.  Patient expressed understanding - Patient has urinary incontinence? No. -  Patient is safe to resume physical and sexual activity  6.  Health Maintenance - HM due items addressed Yes - Last pap smear  Diagnosis  Date Value Ref Range Status  03/05/2023 (A)  Final   - High grade squamous intraepithelial lesion (HSIL)   Pap smear not done at today's visit.   Pt was not aware of colpo/ LEEP , she wishes to reschedule in 1-2 weeks -Breast Cancer screening indicated? No.   7. Chronic Disease/Pregnancy Condition follow up: Hypertension and Gestational Diabetes  - PCP follow up  Jerilynn DELENA Buddle, MD Center for [facility], [facility]

## 2023-09-26 ENCOUNTER — Ambulatory Visit: Payer: Self-pay | Admitting: Obstetrics and Gynecology

## 2023-09-26 LAB — GLUCOSE TOLERANCE, 2 HOURS
Glucose, 2 hour: 73 mg/dL (ref 70–139)
Glucose, GTT - Fasting: 90 mg/dL (ref 70–99)

## 2023-11-11 ENCOUNTER — Ambulatory Visit: Admitting: Obstetrics and Gynecology

## 2023-11-11 ENCOUNTER — Other Ambulatory Visit: Payer: Self-pay

## 2023-11-11 ENCOUNTER — Other Ambulatory Visit (HOSPITAL_COMMUNITY)
Admission: RE | Admit: 2023-11-11 | Discharge: 2023-11-11 | Disposition: A | Discharge status: Home / Self Care | Source: Ambulatory Visit | Attending: Obstetrics and Gynecology | Admitting: Obstetrics and Gynecology

## 2023-11-11 VITALS — BP 153/87 | HR 71 | Wt 232.0 lb

## 2023-11-11 DIAGNOSIS — D069 Carcinoma in situ of cervix, unspecified: Secondary | ICD-10-CM | POA: Diagnosis present

## 2023-11-11 NOTE — Progress Notes (Unsigned)
   GYNECOLOGY OFFICE PROCEDURE NOTE  Gina Hurley is a 33 y.o. [phone] here for LEEP. No GYN concerns. Pap smear and colposcopy history reviewed.    Pap 03/05/23 HSIL, HPV 16 Colpo Biopsy CIN 2/3  ECC n/a  Risks, benefits, alternatives, and limitations of procedure explained to patient, including pain, bleeding, infection, failure to remove abnormal tissue and failure to cure dysplasia, need for repeat procedures, damage to pelvic organs, cervical incompetence.  Role of HPV,cervical dysplasia and need for close followup was empasized. Informed written consent was obtained. All questions were answered. Time out performed. Urine pregnancy test was negative.  ??Procedure: The patient was placed in lithotomy position and the bivalved coated speculum was placed in the patient's vagina. A grounding pad placed on the patient. Lugol's solution was applied to the cervix and areas of decreased uptake were noted around the transformation zone.   Local anesthesia was administered via an intracervical block using 10 ml of 2% Lidocaine  with epinephrine . The suction was turned on and the Large Fisher Cone Biopsy Excisor on 60 Watts of blended current was used to excise the area of decreased uptake and excise the entire transformation zone. Excellent hemostasis was achieved using roller ball coagulation set at 60 Watts coagulation current. Monsel's solution was then applied and the speculum was removed from the vagina. Specimens were sent to pathology.  ?The patient tolerated the procedure well. Post-operative instructions given to patient, including instruction to seek medical attention for persistent bright red bleeding, fever, abdominal/pelvic pain, dysuria, nausea or vomiting. She was also told about the possibility of having copious yellow to black tinged discharge for weeks. She was counseled to avoid anything in the vagina (sex/douching/tampons) for 3 weeks. She has a 4 week post-operative check to assess wound  healing, review results and discuss further management.    Gina HUGGER, MD, FACOG Obstetrician & Gynecologist, [facility] for [employer], [facility]

## 2023-11-13 ENCOUNTER — Ambulatory Visit: Payer: Self-pay | Admitting: Obstetrics and Gynecology

## 2023-11-13 LAB — SURGICAL PATHOLOGY

## 2023-12-12 ENCOUNTER — Ambulatory Visit: Admitting: Obstetrics and Gynecology

## 2023-12-12 ENCOUNTER — Other Ambulatory Visit: Payer: Self-pay

## 2023-12-12 ENCOUNTER — Encounter: Payer: Self-pay | Admitting: Obstetrics and Gynecology

## 2023-12-12 VITALS — BP 119/82 | HR 82 | Wt 231.1 lb

## 2023-12-12 DIAGNOSIS — Z9889 Other specified postprocedural states: Secondary | ICD-10-CM

## 2023-12-12 DIAGNOSIS — D069 Carcinoma in situ of cervix, unspecified: Secondary | ICD-10-CM

## 2023-12-12 NOTE — Progress Notes (Signed)
 GYNECOLOGY VISIT  Patient name: Gina Hurley MRN [id number]  Date of birth: January 28, 1990 Chief Complaint:   Follow-up  History:  [name] presents for LEEP follow up. Initially after procedure experienced discharge but since has been fine. Has had a menstrual cycle and recently finished. Doing well since procedure.   The following portions of the patient's history were reviewed and updated as appropriate: allergies, current medications, past family history, past medical history, past social history, past surgical history and problem list.   Health Maintenance:   Last pap     Component Value Date/Time   DIAGPAP (A) 03/05/2023 0948    - High grade squamous intraepithelial lesion (HSIL)   HPVHIGH Positive (A) 03/05/2023 0948   ADEQPAP  03/05/2023 0948    Satisfactory for evaluation; transformation zone component PRESENT.    Health Maintenance  Topic Date Due   COVID-19 Vaccine (1) Never done   Hepatitis B Vaccine (1 of 3 - 19+ 3-dose series) Never done   Pneumococcal Vaccine (2 of 2 - PCV) 09/30/2013   HPV Vaccine (1 - Risk 3-dose SCDM series) Never done   Pap with HPV screening  03/04/2028   DTaP/Tdap/Td vaccine (3 - Td or Tdap) 05/22/2033   Flu Shot  Completed   Hepatitis C Screening  Completed   HIV Screening  Completed   Meningitis B Vaccine  Aged Out      Review of Systems:  Pertinent items are noted in HPI. Comprehensive review of systems was otherwise negative.   Objective:  Physical Exam BP 119/82   Pulse 82   Wt 231 lb 1.6 oz (104.8 kg)   BMI 38.46 kg/m    Physical Exam Vitals and nursing note reviewed. Exam conducted with a chaperone present.  Constitutional:      Appearance: Normal appearance.  HENT:     Head: Normocephalic and atraumatic.  Pulmonary:     Effort: Pulmonary effort is normal.     Breath sounds: Normal breath sounds.  Genitourinary:    General: Normal vulva.     Exam position: Lithotomy position.     Vagina: Normal.      Cervix: Normal.     Comments: Well healed cervix, small volume dark blood Skin:    General: Skin is warm and dry.  Neurological:     General: No focal deficit present.     Mental Status: She is alert.  Psychiatric:        Mood and Affect: Mood normal.        Behavior: Behavior normal.        Thought Content: Thought content normal.        Judgment: Judgment normal.      Labs and Imaging FINAL MICROSCOPIC DIAGNOSIS:   A. 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       Endocervical glandular colonization by HSIL identified.       Cauterized HSIL focally extends to ectocervical margin.       Endocervical margin is negative for dysplasia.       Negative for invasive carcinoma.   B. ENDOCERVIX, CURETTAGE:       Endocervical glands without significant diagnostic alteration.       No sufficient squamous epithelium identified for evaluation.       Negative for glandular hyperplasia, atypia or malignancy.       Assessment & Plan:  1. S/P LEEP of cervix (Primary) 2. High grade squamous intraepithelial  lesion (HGSIL), grade 3 CIN, on biopsy of cervix Cervix well healed from LEEP. Focally positive margin with negative ECC. Will do pap with ECC in 6 months.     Carter Quarry, MD Minimally Invasive Gynecologic Surgery [facility] for  [facility], [facility]

## 2023-12-13 ENCOUNTER — Emergency Department (HOSPITAL_COMMUNITY): Admission: EM | Admit: 2023-12-13 | Discharge: 2023-12-14 | Discharge status: Against Medical Advice

## 2023-12-13 ENCOUNTER — Encounter (HOSPITAL_COMMUNITY): Payer: Self-pay | Admitting: *Deleted

## 2023-12-13 ENCOUNTER — Emergency Department (HOSPITAL_COMMUNITY)

## 2023-12-13 ENCOUNTER — Other Ambulatory Visit: Payer: Self-pay

## 2023-12-13 DIAGNOSIS — M25562 Pain in left knee: Secondary | ICD-10-CM | POA: Insufficient documentation

## 2023-12-13 DIAGNOSIS — Z5321 Procedure and treatment not carried out due to patient leaving prior to being seen by health care provider: Secondary | ICD-10-CM | POA: Insufficient documentation

## 2023-12-13 MED ORDER — ACETAMINOPHEN 325 MG PO TABS
650.0000 mg | ORAL_TABLET | Freq: Once | ORAL | Status: AC
  Administered 2023-12-13: 650 mg via ORAL

## 2023-12-13 MED ORDER — ACETAMINOPHEN 325 MG PO TABS
ORAL_TABLET | ORAL | Status: AC
  Filled 2023-12-13: qty 2

## 2023-12-13 NOTE — ED Triage Notes (Addendum)
 Patient states that she was moving things off a moving truck and took a wrong step and now she has shooting pains from her left knee to her foot.  She is unable to bear weight on that leg and her left knee is swollen

## 2023-12-14 NOTE — ED Notes (Signed)
 Called PT twice for vitals check. No response.SABRA

## 2023-12-14 NOTE — ED Notes (Signed)
 Pt stated that she was leaving, she also stated that she has been waiting to long.
